# Patient Record
Sex: Female | Born: 1975 | Race: White | Hispanic: No | Smoking: Current every day smoker
Health system: Southern US, Community
[De-identification: ages and names within clinical notes are randomized; demographics above are authoritative.]

## PROBLEM LIST (undated history)

## (undated) DIAGNOSIS — T4145XA Adverse effect of unspecified anesthetic, initial encounter: Secondary | ICD-10-CM

## (undated) DIAGNOSIS — F32A Depression, unspecified: Secondary | ICD-10-CM

## (undated) DIAGNOSIS — J189 Pneumonia, unspecified organism: Secondary | ICD-10-CM

## (undated) DIAGNOSIS — R519 Headache, unspecified: Secondary | ICD-10-CM

## (undated) DIAGNOSIS — E039 Hypothyroidism, unspecified: Secondary | ICD-10-CM

## (undated) DIAGNOSIS — F1010 Alcohol abuse, uncomplicated: Secondary | ICD-10-CM

## (undated) DIAGNOSIS — R51 Headache: Secondary | ICD-10-CM

## (undated) DIAGNOSIS — T8859XA Other complications of anesthesia, initial encounter: Secondary | ICD-10-CM

## (undated) DIAGNOSIS — G43909 Migraine, unspecified, not intractable, without status migrainosus: Secondary | ICD-10-CM

## (undated) DIAGNOSIS — S82842A Displaced bimalleolar fracture of left lower leg, initial encounter for closed fracture: Secondary | ICD-10-CM

## (undated) DIAGNOSIS — D509 Iron deficiency anemia, unspecified: Secondary | ICD-10-CM

## (undated) DIAGNOSIS — K219 Gastro-esophageal reflux disease without esophagitis: Secondary | ICD-10-CM

## (undated) DIAGNOSIS — M5430 Sciatica, unspecified side: Secondary | ICD-10-CM

## (undated) DIAGNOSIS — F329 Major depressive disorder, single episode, unspecified: Secondary | ICD-10-CM

## (undated) DIAGNOSIS — F419 Anxiety disorder, unspecified: Secondary | ICD-10-CM

## (undated) HISTORY — PX: SHOULDER ARTHROSCOPY: SHX128

## (undated) HISTORY — PX: NASAL SEPTOPLASTY W/ TURBINOPLASTY: SHX2070

## (undated) HISTORY — DX: Major depressive disorder, single episode, unspecified: F32.9

## (undated) HISTORY — PX: TONSILLECTOMY: SUR1361

## (undated) HISTORY — DX: Anxiety disorder, unspecified: F41.9

## (undated) HISTORY — PX: APPENDECTOMY: SHX54

## (undated) HISTORY — DX: Depression, unspecified: F32.A

## (undated) HISTORY — PX: FRACTURE SURGERY: SHX138

---

## 1999-09-08 HISTORY — PX: LAPAROSCOPIC TUBAL LIGATION: SUR803

## 1999-12-28 ENCOUNTER — Encounter: Payer: Self-pay | Admitting: Emergency Medicine

## 1999-12-28 ENCOUNTER — Emergency Department (HOSPITAL_COMMUNITY): Admission: EM | Admit: 1999-12-28 | Discharge: 1999-12-28 | Payer: Self-pay | Admitting: Emergency Medicine

## 2000-01-08 ENCOUNTER — Ambulatory Visit (HOSPITAL_COMMUNITY): Admission: RE | Admit: 2000-01-08 | Discharge: 2000-01-08 | Payer: Self-pay | Admitting: Occupational Medicine

## 2000-01-08 ENCOUNTER — Encounter: Payer: Self-pay | Admitting: Occupational Medicine

## 2004-09-07 HISTORY — PX: LAPAROSCOPIC CHOLECYSTECTOMY: SUR755

## 2007-09-08 HISTORY — PX: ROUX-EN-Y GASTRIC BYPASS: SHX1104

## 2008-09-07 HISTORY — PX: DILATION AND CURETTAGE OF UTERUS: SHX78

## 2008-09-07 HISTORY — PX: CYSTOSCOPY WITH HYDRODISTENSION AND BIOPSY: SHX5127

## 2008-09-07 HISTORY — PX: ENDOMETRIAL ABLATION: SHX621

## 2009-09-07 HISTORY — PX: LAPAROTOMY: SHX154

## 2011-09-08 HISTORY — PX: VAGINAL HYSTERECTOMY: SUR661

## 2011-11-20 DIAGNOSIS — E538 Deficiency of other specified B group vitamins: Secondary | ICD-10-CM | POA: Insufficient documentation

## 2011-11-20 DIAGNOSIS — N951 Menopausal and female climacteric states: Secondary | ICD-10-CM | POA: Insufficient documentation

## 2011-11-20 DIAGNOSIS — E039 Hypothyroidism, unspecified: Secondary | ICD-10-CM | POA: Insufficient documentation

## 2012-04-10 ENCOUNTER — Encounter (HOSPITAL_BASED_OUTPATIENT_CLINIC_OR_DEPARTMENT_OTHER): Payer: Self-pay | Admitting: *Deleted

## 2012-04-10 ENCOUNTER — Emergency Department (HOSPITAL_BASED_OUTPATIENT_CLINIC_OR_DEPARTMENT_OTHER)
Admission: EM | Admit: 2012-04-10 | Discharge: 2012-04-10 | Disposition: A | Discharge status: Home / Self Care | Payer: PRIVATE HEALTH INSURANCE | Attending: Emergency Medicine | Admitting: Emergency Medicine

## 2012-04-10 DIAGNOSIS — G5600 Carpal tunnel syndrome, unspecified upper limb: Secondary | ICD-10-CM

## 2012-04-10 DIAGNOSIS — Z90710 Acquired absence of both cervix and uterus: Secondary | ICD-10-CM | POA: Insufficient documentation

## 2012-04-10 DIAGNOSIS — M79609 Pain in unspecified limb: Secondary | ICD-10-CM | POA: Insufficient documentation

## 2012-04-10 DIAGNOSIS — Z9884 Bariatric surgery status: Secondary | ICD-10-CM | POA: Insufficient documentation

## 2012-04-10 DIAGNOSIS — R209 Unspecified disturbances of skin sensation: Secondary | ICD-10-CM | POA: Insufficient documentation

## 2012-04-10 MED ORDER — PREDNISONE 10 MG PO TABS: 20.0000 mg | ORAL_TABLET | Freq: Every day | ORAL | Status: DC

## 2012-04-10 NOTE — ED Notes (Signed)
Yesterday was lifting patient and states she felt a burn in her left shoulder and is now having pain in her arm and numbness to her hand.

## 2012-04-10 NOTE — ED Provider Notes (Signed)
History   This chart was scribed for Julie Quarry, MD by Shari Heritage. The patient was seen in room MH08/MH08. Patient's care was started at 1633.     CSN: [id number]  Arrival date & time 04/10/12  1633   First MD Initiated Contact with Patient 04/10/12 1649      Chief Complaint  Patient presents with  . Arm Injury    (Consider location/radiation/quality/duration/timing/severity/associated sxs/prior treatment) Patient is a 36 y.o. female presenting with arm injury. The history is provided by the patient. No language interpreter was used.  Arm Injury  The incident occurred yesterday. The incident occurred at work. The injury mechanism was a pulled muscle. The injury was related to work. The wounds were self-inflicted. No protective equipment was used. There is an injury to the left shoulder and left upper arm. The pain is moderate. It is unlikely that a foreign body is present. Associated symptoms include numbness. She has received no recent medical care.     Julie Conley is a 36 y.o. female who presents to the Emergency Department complaining of throbbing, moderate to severe left arm pain onset yesterday. Associated symptoms include numbness in her left 4th and 5th fingers. 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 She says that while lifting the patient she felt a twinge in her left shoulder. Patient thinks that this was the result of continued strain on the same muscles for a couple of weeks. Patient has been taking Ibuprofen for pain management. She has a medical history of thyroid disease. Surgical history includes abdominal hysterectomy and gastric bypass. Patient has never smoked.  PCP - [facility]  Past Medical History  Diagnosis Date  . Thyroid disease     Past Surgical History  Procedure Date  . Abdominal hysterectomy   . Gastric bypass      No family history on file.  History  Substance Use Topics  . Smoking status: Never Smoker   . Smokeless tobacco: Not on file  . Alcohol Use: No    OB History    Grav Para Term Preterm Abortions TAB SAB Ect Mult Living                  Review of Systems  Neurological: Positive for numbness.  All other systems reviewed and are negative.    Allergies  Review of patient's allergies indicates not on file.  Home Medications  No current outpatient prescriptions on file.  BP 115/72  Pulse 93  Temp 97.8 F (36.6 C) (Oral)  Resp 20  SpO2 99%  Physical Exam  Nursing note and vitals reviewed. Constitutional: She is oriented to person, place, and time. She appears well-developed and well-nourished.  HENT:  Head: Normocephalic and atraumatic.  Eyes: Conjunctivae and EOM are normal. Pupils are equal, round, and reactive to light.  Neck: Normal range of motion. Neck supple.  Cardiovascular: Normal rate and regular rhythm.   Pulmonary/Chest: Effort normal and breath sounds normal.  Abdominal: Soft. Bowel sounds are normal.  Musculoskeletal:       Upper left extremity was normal. Symptoms were illicited by tapping on anterior aspect of left wrist.  Neurological: She is alert and oriented to person, place, and time.  Skin: Skin is warm and dry.  Psychiatric: She has a normal mood and affect.    ED Course  Procedures (including critical  care time) DIAGNOSTIC STUDIES: Oxygen Saturation is 99% on room air, normal by my interpretation.    COORDINATION OF CARE: 4:51pm- Patient informed of current plan for treatment and evaluation and agrees with plan at this time.     Labs Reviewed - No data to display No results found.   No diagnosis found.    MDM  I personally performed the services described in this documentation, which was scribed in my presence. The recorded information has been reviewed and considered.   Julie Quarry, MD 04/10/12 [phone]

## 2012-12-28 ENCOUNTER — Emergency Department (HOSPITAL_BASED_OUTPATIENT_CLINIC_OR_DEPARTMENT_OTHER)
Admission: EM | Admit: 2012-12-28 | Discharge: 2012-12-28 | Disposition: A | Discharge status: Home / Self Care | Payer: PRIVATE HEALTH INSURANCE | Attending: Emergency Medicine | Admitting: Emergency Medicine

## 2012-12-28 ENCOUNTER — Encounter (HOSPITAL_BASED_OUTPATIENT_CLINIC_OR_DEPARTMENT_OTHER): Payer: Self-pay | Admitting: Family Medicine

## 2012-12-28 DIAGNOSIS — N898 Other specified noninflammatory disorders of vagina: Secondary | ICD-10-CM | POA: Insufficient documentation

## 2012-12-28 DIAGNOSIS — R300 Dysuria: Secondary | ICD-10-CM | POA: Insufficient documentation

## 2012-12-28 DIAGNOSIS — M545 Low back pain, unspecified: Secondary | ICD-10-CM | POA: Insufficient documentation

## 2012-12-28 DIAGNOSIS — E079 Disorder of thyroid, unspecified: Secondary | ICD-10-CM | POA: Insufficient documentation

## 2012-12-28 DIAGNOSIS — Z79899 Other long term (current) drug therapy: Secondary | ICD-10-CM | POA: Insufficient documentation

## 2012-12-28 DIAGNOSIS — R109 Unspecified abdominal pain: Secondary | ICD-10-CM | POA: Insufficient documentation

## 2012-12-28 DIAGNOSIS — N390 Urinary tract infection, site not specified: Secondary | ICD-10-CM

## 2012-12-28 LAB — URINALYSIS, ROUTINE W REFLEX MICROSCOPIC
Nitrite: NEGATIVE
Specific Gravity, Urine: 1.02 (ref 1.005–1.030)
pH: 7 (ref 5.0–8.0)

## 2012-12-28 LAB — WET PREP, GENITAL
Clue Cells Wet Prep HPF POC: NONE SEEN
Trich, Wet Prep: NONE SEEN
Yeast Wet Prep HPF POC: NONE SEEN

## 2012-12-28 LAB — URINE MICROSCOPIC-ADD ON

## 2012-12-28 MED ORDER — CEPHALEXIN 500 MG PO CAPS: 500.0000 mg | ORAL_CAPSULE | Freq: Four times a day (QID) | ORAL | Status: DC

## 2012-12-28 NOTE — ED Provider Notes (Signed)
History     CSN: [id number]  Arrival date & time 12/28/12  1478   First MD Initiated Contact with Patient 12/28/12 0745      Chief Complaint  Patient presents with  . Urinary Tract Infection    (Consider location/radiation/quality/duration/timing/severity/associated sxs/prior treatment) Patient is a 37 y.o. female presenting with urinary tract infection.  Urinary Tract Infection   Pt reports history of urinary tract infections, states she was treated for same at local Urgent Care several weeks ago with course of Macrobid, given a refill which she also took when symptoms returned and again about a week ago while out of town, she was given a course of Levaquin which she finished yesterday. She reports continued dysuria, vaginal itching and white discharge. She has moderate aching bilateral lower back pain, but no fever or vomiting. She was seen at [facility] about a month ago for vaginal discharge and treated for BV. GC/C was neg then.   Past Medical History  Diagnosis Date  . Thyroid disease     Past Surgical History  Procedure Laterality Date  . Abdominal hysterectomy    . Gastric bypass    . Tonsillectomy    . Cholecystectomy    . Appendectomy    . Cesarean section      No family history on file.  History  Substance Use Topics  . Smoking status: Never Smoker   . Smokeless tobacco: Not on file  . Alcohol Use: No    OB History   Grav Para Term Preterm Abortions TAB SAB Ect Mult Living                  Review of Systems All other systems reviewed and are negative except as noted in HPI.   Allergies  Ciprocinonide and Sulfa antibiotics  Home Medications   Current Outpatient Rx  Name  Route  Sig  Dispense  Refill  . estrogens, conjugated, (PREMARIN) 0.625 MG tablet   Oral   Take 0.625 mg by mouth daily. Take daily for 21 days then do not take for 7 days.         Marland Kitchen levothyroxine (SYNTHROID, LEVOTHROID) 25 MCG tablet   Oral   Take 25 mcg by mouth  daily.         . Multiple Vitamin (MULTIVITAMIN) tablet   Oral   Take 1 tablet by mouth daily.         Marland Kitchen omeprazole (PRILOSEC) 20 MG capsule   Oral   Take 20 mg by mouth daily.         . predniSONE (DELTASONE) 10 MG tablet   Oral   Take 2 tablets (20 mg total) by mouth daily.   15 tablet   0     BP 124/91  Pulse 92  Temp(Src) 98.2 F (36.8 C) (Oral)  Resp 16  SpO2 99%  Physical Exam  Nursing note and vitals reviewed. Constitutional: She is oriented to person, place, and time. She appears well-developed and well-nourished.  HENT:  Head: Normocephalic and atraumatic.  Eyes: EOM are normal. Pupils are equal, round, and reactive to light.  Neck: Normal range of motion. Neck supple.  Cardiovascular: Normal rate, normal heart sounds and intact distal pulses.   Pulmonary/Chest: Effort normal and breath sounds normal.  Abdominal: Bowel sounds are normal. She exhibits no distension. There is tenderness (suprapubic, mild). There is no rebound and no guarding.  Musculoskeletal: Normal range of motion. She exhibits tenderness (lumbar paraspinal muscles). She exhibits no edema.  Neurological: She is alert and oriented to person, place, and time. She has normal strength. No cranial nerve deficit or sensory deficit.  Skin: Skin is warm and dry. No rash noted.  Psychiatric: She has a normal mood and affect.    ED Course  Procedures (including critical care time)  Labs Reviewed  WET PREP, GENITAL - Abnormal; Notable for the following:    WBC, Wet Prep HPF POC MANY (*)    All other components within normal limits  URINALYSIS, ROUTINE W REFLEX MICROSCOPIC - Abnormal; Notable for the following:    APPearance CLOUDY (*)    Leukocytes, UA LARGE (*)    All other components within normal limits  URINE MICROSCOPIC-ADD ON - Abnormal; Notable for the following:    Bacteria, UA MANY (*)    All other components within normal limits  GC/CHLAMYDIA PROBE AMP  URINE CULTURE   No  results found.   1. UTI (urinary tract infection)       MDM  UA shows UTI, neg wet prep. Will give course of Keflex, culture urine given recent Abx and concern for resistant strain. PCP followup.         Charles B. Bernette Mayers, MD 12/28/12 [phone]

## 2012-12-28 NOTE — ED Notes (Signed)
Pt c/o dysuria, right flank pain and has been treated for approx 1 month for same at [facility]. Pt denies fever, n/v. Pt also c/o possible "yeast infection".

## 2012-12-29 LAB — URINE CULTURE: Colony Count: NO GROWTH

## 2013-02-26 ENCOUNTER — Emergency Department (HOSPITAL_BASED_OUTPATIENT_CLINIC_OR_DEPARTMENT_OTHER): Payer: Self-pay

## 2013-02-26 ENCOUNTER — Other Ambulatory Visit: Payer: Self-pay

## 2013-02-26 ENCOUNTER — Encounter (HOSPITAL_BASED_OUTPATIENT_CLINIC_OR_DEPARTMENT_OTHER): Payer: Self-pay | Admitting: *Deleted

## 2013-02-26 ENCOUNTER — Emergency Department (HOSPITAL_BASED_OUTPATIENT_CLINIC_OR_DEPARTMENT_OTHER)
Admission: EM | Admit: 2013-02-26 | Discharge: 2013-02-26 | Disposition: A | Discharge status: Other Acute Inpatient Hospital | Payer: Self-pay | Attending: Emergency Medicine | Admitting: Emergency Medicine

## 2013-02-26 DIAGNOSIS — Z3202 Encounter for pregnancy test, result negative: Secondary | ICD-10-CM | POA: Insufficient documentation

## 2013-02-26 DIAGNOSIS — F10239 Alcohol dependence with withdrawal, unspecified: Secondary | ICD-10-CM | POA: Insufficient documentation

## 2013-02-26 DIAGNOSIS — Z9884 Bariatric surgery status: Secondary | ICD-10-CM | POA: Insufficient documentation

## 2013-02-26 DIAGNOSIS — N760 Acute vaginitis: Secondary | ICD-10-CM | POA: Insufficient documentation

## 2013-02-26 DIAGNOSIS — F10939 Alcohol use, unspecified with withdrawal, unspecified: Secondary | ICD-10-CM | POA: Insufficient documentation

## 2013-02-26 DIAGNOSIS — F10230 Alcohol dependence with withdrawal, uncomplicated: Secondary | ICD-10-CM

## 2013-02-26 DIAGNOSIS — F329 Major depressive disorder, single episode, unspecified: Secondary | ICD-10-CM | POA: Insufficient documentation

## 2013-02-26 DIAGNOSIS — B9689 Other specified bacterial agents as the cause of diseases classified elsewhere: Secondary | ICD-10-CM | POA: Insufficient documentation

## 2013-02-26 DIAGNOSIS — R0789 Other chest pain: Secondary | ICD-10-CM | POA: Insufficient documentation

## 2013-02-26 DIAGNOSIS — N898 Other specified noninflammatory disorders of vagina: Secondary | ICD-10-CM | POA: Insufficient documentation

## 2013-02-26 DIAGNOSIS — A499 Bacterial infection, unspecified: Secondary | ICD-10-CM | POA: Insufficient documentation

## 2013-02-26 DIAGNOSIS — N390 Urinary tract infection, site not specified: Secondary | ICD-10-CM | POA: Insufficient documentation

## 2013-02-26 DIAGNOSIS — F3289 Other specified depressive episodes: Secondary | ICD-10-CM | POA: Insufficient documentation

## 2013-02-26 LAB — COMPREHENSIVE METABOLIC PANEL
BUN: 11 mg/dL (ref 6–23)
Calcium: 9.7 mg/dL (ref 8.4–10.5)
GFR calc Af Amer: >90 mL/min (ref >90)
Glucose, Bld: 110 mg/dL — ABNORMAL HIGH (ref 70–99)
Total Protein: 8.2 g/dL (ref 6.0–8.3)

## 2013-02-26 LAB — CBC WITH DIFFERENTIAL/PLATELET
Eosinophils Absolute: 0 10*3/uL (ref 0.0–0.7)
Eosinophils Relative: 1 % (ref 0–5)
Hemoglobin: 15.4 g/dL — ABNORMAL HIGH (ref 12.0–15.0)
Lymphs Abs: 1.9 10*3/uL (ref 0.7–4.0)
MCH: 32.6 pg (ref 26.0–34.0)
MCV: 91.3 fL (ref 78.0–100.0)
Monocytes Absolute: 0.3 10*3/uL (ref 0.1–1.0)
Monocytes Relative: 5 % (ref 3–12)
RBC: 4.72 MIL/uL (ref 3.87–5.11)

## 2013-02-26 LAB — URINALYSIS, ROUTINE W REFLEX MICROSCOPIC
Ketones, ur: 40 mg/dL — AB
Nitrite: NEGATIVE
Specific Gravity, Urine: 1.03 (ref 1.005–1.030)
pH: 6.5 (ref 5.0–8.0)

## 2013-02-26 LAB — ACETAMINOPHEN LEVEL: Acetaminophen (Tylenol), Serum: <15 ug/mL (ref 10–30)

## 2013-02-26 LAB — URINE MICROSCOPIC-ADD ON

## 2013-02-26 LAB — RAPID URINE DRUG SCREEN, HOSP PERFORMED
Barbiturates: NOT DETECTED
Cocaine: NOT DETECTED

## 2013-02-26 LAB — SALICYLATE LEVEL: Salicylate Lvl: <2 mg/dL — ABNORMAL LOW (ref 2.8–20.0)

## 2013-02-26 LAB — WET PREP, GENITAL: Yeast Wet Prep HPF POC: NONE SEEN

## 2013-02-26 MED ORDER — ADULT MULTIVITAMIN W/MINERALS CH
1.0000 | ORAL_TABLET | Freq: Every day | ORAL | Status: DC
  Administered 2013-02-26: 1 via ORAL
  Filled 2013-02-26: qty 1

## 2013-02-26 MED ORDER — ONDANSETRON 8 MG PO TBDP
8.0000 mg | ORAL_TABLET | Freq: Once | ORAL | Status: AC
  Administered 2013-02-26: 8 mg via ORAL

## 2013-02-26 MED ORDER — METRONIDAZOLE 500 MG PO TABS
500.0000 mg | ORAL_TABLET | Freq: Once | ORAL | Status: AC
  Administered 2013-02-26: 500 mg via ORAL
  Filled 2013-02-26: qty 1

## 2013-02-26 MED ORDER — LORAZEPAM 1 MG PO TABS: 1.0000 mg | ORAL_TABLET | Freq: Four times a day (QID) | ORAL | Status: DC | PRN

## 2013-02-26 MED ORDER — THIAMINE HCL 100 MG/ML IJ SOLN: 100.0000 mg | Freq: Every day | INTRAMUSCULAR | Status: DC

## 2013-02-26 MED ORDER — VITAMIN B-1 100 MG PO TABS
100.0000 mg | ORAL_TABLET | Freq: Every day | ORAL | Status: DC
  Administered 2013-02-26: 100 mg via ORAL
  Filled 2013-02-26: qty 1

## 2013-02-26 MED ORDER — ONDANSETRON 8 MG PO TBDP
ORAL_TABLET | ORAL | Status: AC
  Filled 2013-02-26: qty 1

## 2013-02-26 MED ORDER — LORAZEPAM 2 MG/ML IJ SOLN
1.0000 mg | Freq: Four times a day (QID) | INTRAMUSCULAR | Status: DC | PRN
  Administered 2013-02-26: 1 mg via INTRAVENOUS
  Filled 2013-02-26 (×2): qty 1

## 2013-02-26 MED ORDER — CEPHALEXIN 250 MG PO CAPS
500.0000 mg | ORAL_CAPSULE | Freq: Once | ORAL | Status: AC
  Administered 2013-02-26: 500 mg via ORAL
  Filled 2013-02-26: qty 2

## 2013-02-26 MED ORDER — FOLIC ACID 1 MG PO TABS
1.0000 mg | ORAL_TABLET | Freq: Every day | ORAL | Status: DC
  Administered 2013-02-26: 1 mg via ORAL
  Filled 2013-02-26: qty 1

## 2013-02-26 MED ORDER — LORAZEPAM 2 MG/ML IJ SOLN
1.0000 mg | Freq: Once | INTRAMUSCULAR | Status: AC
  Administered 2013-02-26: 1 mg via INTRAVENOUS

## 2013-02-26 NOTE — ED Notes (Signed)
Has been drinking for past 5 days reports drinking 2-3 six packs each day for those days pt lost her husband recently and has been depressed has had some pain in chest off and on each day for 5 days but worse today

## 2013-02-26 NOTE — ED Provider Notes (Addendum)
History    Scribed for Julie Cooper III, MD, the patient was seen in room MH10/MH10. This chart was scribed by Lewanda Rife, ED scribe. Patient's care was started at 1850   CSN: [id number]  Arrival date & time 02/26/13  4098   First MD Initiated Contact with Patient 02/26/13 1847      Chief Complaint  Patient presents with  . Chest Pain    concerned may have etoh poisioning    (Consider location/radiation/quality/duration/timing/severity/associated sxs/prior treatment) The history is provided by the patient.   HPI Comments: Julie Conley is a 37 y.o. female who presents to the Emergency Department complaining of "possible alcohol poisoning" onset today. Reports associated "binge drinking for past 5 days", chest pain, vaginal discharge, and depression. Describes chest pain as "tightness". Reports several episodes of drinking beer intermittently since her husband's death 09-21-22 of this year. Reports drinking "several 6 packs a day" recently. Denies any aggravating and alleviating factors. Denies associated otalgia, sore throat, shortness of breath, cough, fever, dysuria, rash, syncope, seizures, abdominal pain, nausea, emesis, diarrhea, and suicidal ideations. Denies significant PMH. Denies illicit substance abuse. Reports hx of abdominal hysterectomy, appendectomy,   History reviewed. No pertinent past medical history.  Past Surgical History  Procedure Laterality Date  . Abdominal hysterectomy    . Appendectomy    . Tonsillectomy    . Gastric bypass      History reviewed. No pertinent family history.  History  Substance Use Topics  . Smoking status: Never Smoker   . Smokeless tobacco: Not on file  . Alcohol Use: Yes     Comment: usually drinks occasionally but has drank daily for last 5 days    OB History   Grav Para Term Preterm Abortions TAB SAB Ect Mult Living                  Review of Systems  Constitutional: Negative for fever.  HENT: Negative for sore  throat.   Respiratory: Negative for shortness of breath.   Cardiovascular: Positive for chest pain.  Gastrointestinal: Negative for nausea, vomiting, abdominal pain and diarrhea.  Genitourinary: Positive for vaginal discharge. Negative for dysuria and vaginal pain.  Skin: Negative for rash.  Neurological: Negative for seizures and syncope.  Psychiatric/Behavioral: Negative for suicidal ideas and confusion.  All other systems reviewed and are negative.  A complete 10 system review of systems was obtained and all systems are negative except as noted in the HPI and PMH.     Allergies  Sulfa antibiotics  Home Medications  No current outpatient prescriptions on file.  There were no vitals taken for this visit.  Physical Exam  Nursing note and vitals reviewed. Constitutional: She is oriented to person, place, and time. She appears well-developed and well-nourished. No distress.  HENT:  Head: Normocephalic and atraumatic.  Right Ear: Tympanic membrane normal.  Left Ear: Tympanic membrane normal.  Mouth/Throat: Uvula is midline and oropharynx is clear and moist. No posterior oropharyngeal erythema.  Eyes: Conjunctivae and EOM are normal.  Neck: Trachea normal. Neck supple. Carotid bruit is not present. No tracheal deviation present.  Cardiovascular: Normal rate, regular rhythm, normal heart sounds, intact distal pulses and normal pulses.   Pulmonary/Chest: Effort normal and breath sounds normal. No respiratory distress. She has no wheezes. She has no rales. She exhibits no tenderness.  Abdominal: Soft. Bowel sounds are normal. She exhibits no distension. There is no tenderness.  Genitourinary: Uterus normal. Uterus is not tender. Right adnexum displays no mass and  no tenderness. Left adnexum displays no mass and no tenderness. No tenderness around the vagina. Vaginal discharge found.  Normal female external genitalia. Speculum exam shows a white d/c. Bimanual exam shows no uterine or  adnexal tenderness, or mass.    Musculoskeletal: Normal range of motion.  Neurological: She is alert and oriented to person, place, and time.  noticeable hand tremor noted on exam.    Skin: Skin is warm and dry.  Psychiatric: Her behavior is normal. She exhibits a depressed mood. She expresses no suicidal ideation.    ED Course  Procedures (including critical care time) Medications  LORazepam (ATIVAN) tablet 1 mg ( Oral See Alternative 02/26/13 1940)    Or  LORazepam (ATIVAN) injection 1 mg (1 mg Intravenous Given 02/26/13 1940)  thiamine (VITAMIN B-1) tablet 100 mg (not administered)    Or  thiamine (B-1) injection 100 mg (not administered)  folic acid (FOLVITE) tablet 1 mg (not administered)  multivitamin with minerals tablet 1 tablet (not administered)  ondansetron (ZOFRAN-ODT) disintegrating tablet 8 mg (8 mg Oral Given 02/26/13 1903)     Date: 02/26/2013  Rate: 93  Rhythm: normal sinus rhythm  QRS Axis: left  Intervals: normal  ST/T Wave abnormalities: normal  Conduction Disutrbances:none  Narrative Interpretation: Abnormal EKG  Old EKG Reviewed: none available  10:17 PM Results for orders placed during the hospital encounter of 02/26/13  WET PREP, GENITAL      Result Value Range   Yeast Wet Prep HPF POC NONE SEEN  NONE SEEN   Trich, Wet Prep NONE SEEN  NONE SEEN   Clue Cells Wet Prep HPF POC TOO NUMEROUS TO COUNT (*) NONE SEEN   WBC, Wet Prep HPF POC FEW (*) NONE SEEN  CBC WITH DIFFERENTIAL      Result Value Range   WBC 6.6  4.0 - 10.5 K/uL   RBC 4.72  3.87 - 5.11 MIL/uL   Hemoglobin 15.4 (*) 12.0 - 15.0 g/dL   HCT 96.0  45.4 - 09.8 %   MCV 91.3  78.0 - 100.0 fL   MCH 32.6  26.0 - 34.0 pg   MCHC 35.7  30.0 - 36.0 g/dL   RDW 11.9  14.7 - 82.9 %   Platelets 232  150 - 400 K/uL   Neutrophils Relative % 65  43 - 77 %   Neutro Abs 4.3  1.7 - 7.7 K/uL   Lymphocytes Relative 29  12 - 46 %   Lymphs Abs 1.9  0.7 - 4.0 K/uL   Monocytes Relative 5  3 - 12 %    Monocytes Absolute 0.3  0.1 - 1.0 K/uL   Eosinophils Relative 1  0 - 5 %   Eosinophils Absolute 0.0  0.0 - 0.7 K/uL   Basophils Relative 1  0 - 1 %   Basophils Absolute 0.0  0.0 - 0.1 K/uL  COMPREHENSIVE METABOLIC PANEL      Result Value Range   Sodium 138  135 - 145 mEq/L   Potassium 3.9  3.5 - 5.1 mEq/L   Chloride 95 (*) 96 - 112 mEq/L   CO2 26  19 - 32 mEq/L   Glucose, Bld 110 (*) 70 - 99 mg/dL   BUN 11  6 - 23 mg/dL   Creatinine, Ser 5.62  0.50 - 1.10 mg/dL   Calcium 9.7  8.4 - 13.0 mg/dL   Total Protein 8.2  6.0 - 8.3 g/dL   Albumin 4.6  3.5 - 5.2 g/dL   AST  35  0 - 37 U/L   ALT 21  0 - 35 U/L   Alkaline Phosphatase 77  39 - 117 U/L   Total Bilirubin 0.4  0.3 - 1.2 mg/dL   GFR calc non Af Amer >90  >90 mL/min   GFR calc Af Amer >90  >90 mL/min  URINALYSIS, ROUTINE W REFLEX MICROSCOPIC      Result Value Range   Color, Urine AMBER (*) YELLOW   APPearance CLOUDY (*) CLEAR   Specific Gravity, Urine 1.030  1.005 - 1.030   pH 6.5  5.0 - 8.0   Glucose, UA NEGATIVE  NEGATIVE mg/dL   Hgb urine dipstick NEGATIVE  NEGATIVE   Bilirubin Urine SMALL (*) NEGATIVE   Ketones, ur 40 (*) NEGATIVE mg/dL   Protein, ur 161 (*) NEGATIVE mg/dL   Urobilinogen, UA 1.0  0.0 - 1.0 mg/dL   Nitrite NEGATIVE  NEGATIVE   Leukocytes, UA MODERATE (*) NEGATIVE  PREGNANCY, URINE      Result Value Range   Preg Test, Ur NEGATIVE  NEGATIVE  URINE RAPID DRUG SCREEN (HOSP PERFORMED)      Result Value Range   Opiates NONE DETECTED  NONE DETECTED   Cocaine NONE DETECTED  NONE DETECTED   Benzodiazepines NONE DETECTED  NONE DETECTED   Amphetamines NONE DETECTED  NONE DETECTED   Tetrahydrocannabinol NONE DETECTED  NONE DETECTED   Barbiturates NONE DETECTED  NONE DETECTED  ETHANOL      Result Value Range   Alcohol, Ethyl (B) <11  0 - 11 mg/dL  ACETAMINOPHEN LEVEL      Result Value Range   Acetaminophen (Tylenol), Serum <15.0  10 - 30 ug/mL  SALICYLATE LEVEL      Result Value Range   Salicylate Lvl <2.0  (*) 2.8 - 20.0 mg/dL  URINE MICROSCOPIC-ADD ON      Result Value Range   Squamous Epithelial / LPF FEW (*) RARE   WBC, UA TOO NUMEROUS TO COUNT  <3 WBC/hpf   RBC / HPF 11-20  <3 RBC/hpf   Bacteria, UA MANY (*) RARE   Dg Chest 2 View  02/26/2013   *RADIOLOGY REPORT*  Clinical Data: Binge  drinking.  Chest pain depression  CHEST - 2 VIEW  Comparison:  None.  Findings:  The heart size and mediastinal contours are within normal limits.  Both lungs are clear.  The visualized skeletal structures are unremarkable.  IMPRESSION: No active cardiopulmonary disease.   Original Report Authenticated By: Janeece Riggers, M.D.    Pt has had 2 doses of Ativan, is not currently tremulous.  Her lab tests showed a UTI and bacterial vaginosis.  She will need treatment with metronidazole and an antibiotic such as Keflex 500 mg qid. Will request hospitalist at [facility] to admit her for alcohol withdrawal, impending DT's.      1. Alcohol withdrawal syndrome, uncomplicated   2. Urinary tract infection   3. Bacterial vaginosis     Case discussed with Dr. Pearson Grippe at [facility], who accepts pt to a telemetry bed.   I personally performed the services described in this documentation, which was scribed in my presence. The recorded information has been reviewed and is accurate.  Osvaldo Human, MD      Julie Cooper III, MD 02/26/13 2241     Julie Cooper III, MD 02/26/13 2242

## 2013-02-26 NOTE — ED Notes (Signed)
[facility] here to transport pt. 

## 2013-02-26 NOTE — ED Notes (Signed)
Assigned to bed 730 @ [facility] per nursing supervisor, Boneta Lucks, RN notified, [facility] called for transport.

## 2013-02-28 LAB — URINE CULTURE

## 2013-03-01 ENCOUNTER — Encounter (HOSPITAL_BASED_OUTPATIENT_CLINIC_OR_DEPARTMENT_OTHER): Payer: Self-pay | Admitting: *Deleted

## 2013-03-01 NOTE — ED Notes (Deleted)
Fax results to [facility]  Per Glade Nurse.

## 2013-03-03 NOTE — ED Notes (Deleted)
+   Chlamydia Patient treated with Rocephin And Zithromax-[facility] faxed 

## 2013-03-05 ENCOUNTER — Telehealth (HOSPITAL_COMMUNITY): Payer: Self-pay | Admitting: Emergency Medicine

## 2013-03-05 NOTE — Telephone Encounter (Signed)
Pt notified of + Chlamydia. Treatment not given in ED. Notified of + result after ID verified x three. STD instructions provided. RX Azithromycin one gram PO x one dose, prescriber Marlon Pel PA called to [facility] [phone] staff.

## 2013-03-06 NOTE — ED Notes (Signed)
+   Gonorrhea + Chlamydia Rx for Azithromycin 1 gram tab take tab po once disp #2 per Marlon Pel. Needs to be called to pharmacy.Rx called to [facility] per Steward Ros PFM

## 2013-09-07 HISTORY — PX: AUGMENTATION MAMMAPLASTY: SUR837

## 2013-09-07 HISTORY — PX: ABDOMINOPLASTY: SHX5355

## 2013-10-07 DIAGNOSIS — F419 Anxiety disorder, unspecified: Secondary | ICD-10-CM

## 2013-10-07 DIAGNOSIS — F329 Major depressive disorder, single episode, unspecified: Secondary | ICD-10-CM | POA: Insufficient documentation

## 2013-10-07 DIAGNOSIS — F32A Depression, unspecified: Secondary | ICD-10-CM | POA: Insufficient documentation

## 2013-10-07 DIAGNOSIS — G4700 Insomnia, unspecified: Secondary | ICD-10-CM | POA: Insufficient documentation

## 2014-08-06 ENCOUNTER — Emergency Department (HOSPITAL_BASED_OUTPATIENT_CLINIC_OR_DEPARTMENT_OTHER)
Admission: EM | Admit: 2014-08-06 | Discharge: 2014-08-06 | Disposition: A | Discharge status: Home / Self Care | Payer: 59 | Attending: Emergency Medicine | Admitting: Emergency Medicine

## 2014-08-06 ENCOUNTER — Encounter (HOSPITAL_BASED_OUTPATIENT_CLINIC_OR_DEPARTMENT_OTHER): Payer: Self-pay | Admitting: *Deleted

## 2014-08-06 DIAGNOSIS — Z8639 Personal history of other endocrine, nutritional and metabolic disease: Secondary | ICD-10-CM | POA: Diagnosis not present

## 2014-08-06 DIAGNOSIS — F1010 Alcohol abuse, uncomplicated: Secondary | ICD-10-CM | POA: Insufficient documentation

## 2014-08-06 LAB — CBC WITH DIFFERENTIAL/PLATELET
BASOS PCT: 1 % (ref 0–1)
Basophils Absolute: 0 10*3/uL (ref 0.0–0.1)
Eosinophils Absolute: 0.1 10*3/uL (ref 0.0–0.7)
Eosinophils Relative: 1 % (ref 0–5)
HCT: 38.9 % (ref 36.0–46.0)
HEMOGLOBIN: 13.7 g/dL (ref 12.0–15.0)
LYMPHS ABS: 2.8 10*3/uL (ref 0.7–4.0)
Lymphocytes Relative: 43 % (ref 12–46)
MCH: 30.2 pg (ref 26.0–34.0)
MCHC: 35.2 g/dL (ref 30.0–36.0)
MCV: 85.9 fL (ref 78.0–100.0)
MONOS PCT: 6 % (ref 3–12)
Monocytes Absolute: 0.4 10*3/uL (ref 0.1–1.0)
NEUTROS PCT: 49 % (ref 43–77)
Neutro Abs: 3.2 10*3/uL (ref 1.7–7.7)
Platelets: 221 10*3/uL (ref 150–400)
RBC: 4.53 MIL/uL (ref 3.87–5.11)
RDW: 12.7 % (ref 11.5–15.5)
WBC: 6.5 10*3/uL (ref 4.0–10.5)

## 2014-08-06 LAB — COMPREHENSIVE METABOLIC PANEL
ALBUMIN: 4.3 g/dL (ref 3.5–5.2)
ALT: 17 U/L (ref 0–35)
ANION GAP: 17 — AB (ref 5–15)
AST: 27 U/L (ref 0–37)
Alkaline Phosphatase: 91 U/L (ref 39–117)
BILIRUBIN TOTAL: 0.2 mg/dL — AB (ref 0.3–1.2)
BUN: 9 mg/dL (ref 6–23)
CHLORIDE: 95 meq/L — AB (ref 96–112)
CO2: 26 mEq/L (ref 19–32)
Calcium: 9.5 mg/dL (ref 8.4–10.5)
Creatinine, Ser: 0.7 mg/dL (ref 0.50–1.10)
GFR calc Af Amer: >90 mL/min (ref >90)
GFR calc non Af Amer: >90 mL/min (ref >90)
Glucose, Bld: 78 mg/dL (ref 70–99)
POTASSIUM: 4.6 meq/L (ref 3.7–5.3)
Sodium: 138 mEq/L (ref 137–147)
Total Protein: 8.2 g/dL (ref 6.0–8.3)

## 2014-08-06 LAB — RAPID URINE DRUG SCREEN, HOSP PERFORMED
Amphetamines: NOT DETECTED
Barbiturates: NOT DETECTED
Benzodiazepines: NOT DETECTED
Cocaine: NOT DETECTED
Opiates: NOT DETECTED
TETRAHYDROCANNABINOL: NOT DETECTED

## 2014-08-06 LAB — ETHANOL: Alcohol, Ethyl (B): 228 mg/dL — ABNORMAL HIGH (ref 0–11)

## 2014-08-06 MED ORDER — ONDANSETRON HCL 4 MG/2ML IJ SOLN
INTRAMUSCULAR | Status: AC
  Filled 2014-08-06: qty 2

## 2014-08-06 MED ORDER — ONDANSETRON 4 MG PO TBDP: ORAL_TABLET | ORAL | Status: DC

## 2014-08-06 MED ORDER — ONDANSETRON HCL 4 MG/2ML IJ SOLN
4.0000 mg | Freq: Once | INTRAMUSCULAR | Status: DC
  Filled 2014-08-06: qty 2

## 2014-08-06 MED ORDER — ONDANSETRON HCL 4 MG/2ML IJ SOLN
4.0000 mg | Freq: Once | INTRAMUSCULAR | Status: AC
  Administered 2014-08-06: 4 mg via INTRAVENOUS

## 2014-08-06 MED ORDER — LORAZEPAM 2 MG/ML IJ SOLN
1.0000 mg | Freq: Once | INTRAMUSCULAR | Status: AC
  Administered 2014-08-06: 1 mg via INTRAVENOUS
  Filled 2014-08-06: qty 1

## 2014-08-06 MED ORDER — SODIUM CHLORIDE 0.9 % IV BOLUS (SEPSIS)
1000.0000 mL | Freq: Once | INTRAVENOUS | Status: AC
  Administered 2014-08-06: 1000 mL via INTRAVENOUS

## 2014-08-06 NOTE — ED Provider Notes (Signed)
CSN: [id number]     Arrival date & time 08/06/14  1525 History   First MD Initiated Contact with Patient 08/06/14 1625     Chief Complaint  Patient presents with  . Alcohol Problem     (Consider location/radiation/quality/duration/timing/severity/associated sxs/prior Treatment) Patient is a 38 y.o. female presenting with alcohol problem. The history is provided by the patient. No language interpreter was used.  Alcohol Problem This is a new problem. The current episode started in the past 7 days. The problem occurs constantly. The problem has been gradually worsening. Pertinent negatives include no abdominal pain or vomiting. She has tried nothing for the symptoms. The treatment provided moderate relief.  Pt request help with alcohol abuse.  Pt reports she has been drinking since Friday.  Pt reports she was unable to stop  Past Medical History  Diagnosis Date  . Thyroid disease    Past Surgical History  Procedure Laterality Date  . Cholecystectomy    . Cesarean section    . Abdominal hysterectomy    . Appendectomy    . Tonsillectomy    . Gastric bypass     History reviewed. No pertinent family history. History  Substance Use Topics  . Smoking status: Never Smoker   . Smokeless tobacco: Not on file  . Alcohol Use: Yes     Comment: usually drinks occasionally but has drank daily for last 5 days   OB History    No data available     Review of Systems  Gastrointestinal: Negative for vomiting and abdominal pain.  All other systems reviewed and are negative.     Allergies  Ciprocinonide; Sulfa antibiotics; and Sulfa antibiotics  Home Medications   Prior to Admission medications   Not on File   BP 140/99 mmHg  Pulse 82  Temp(Src) 98.4 F (36.9 C) (Oral)  Resp 18  Ht 5\' 4"  (1.626 m)  Wt 175 lb (79.379 kg)  BMI 30.02 kg/m2  SpO2 97% Physical Exam  Constitutional: She is oriented to person, place, and time. She appears well-developed and well-nourished.  HENT:   Head: Normocephalic.  Eyes: Conjunctivae and EOM are normal. Pupils are equal, round, and reactive to light.  Neck: Normal range of motion.  Cardiovascular: Normal rate.   Pulmonary/Chest: Effort normal.  Abdominal: She exhibits no distension.  Musculoskeletal: Normal range of motion.  Neurological: She is alert and oriented to person, place, and time.  Skin: Skin is warm.  Psychiatric: She has a normal mood and affect.  Nursing note and vitals reviewed.   ED Course  Procedures (including critical care time) Labs Review Labs Reviewed - No data to display  Imaging Review No results found.   EKG Interpretation None      Results for orders placed or performed during the hospital encounter of 08/06/14  Ethanol  Result Value Ref Range   Alcohol, Ethyl (B) 228 (H) 0 - 11 mg/dL  Drug screen panel, emergency  Result Value Ref Range   Opiates NONE DETECTED NONE DETECTED   Cocaine NONE DETECTED NONE DETECTED   Benzodiazepines NONE DETECTED NONE DETECTED   Amphetamines NONE DETECTED NONE DETECTED   Tetrahydrocannabinol NONE DETECTED NONE DETECTED   Barbiturates NONE DETECTED NONE DETECTED  CBC with Differential  Result Value Ref Range   WBC 6.5 4.0 - 10.5 K/uL   RBC 4.53 3.87 - 5.11 MIL/uL   Hemoglobin 13.7 12.0 - 15.0 g/dL   HCT 40.938.9 81.136.0 - 91.446.0 %   MCV 85.9 78.0 - 100.0 fL  MCH 30.2 26.0 - 34.0 pg   MCHC 35.2 30.0 - 36.0 g/dL   RDW 24.412.7 01.011.5 - 27.215.5 %   Platelets 221 150 - 400 K/uL   Neutrophils Relative % 49 43 - 77 %   Neutro Abs 3.2 1.7 - 7.7 K/uL   Lymphocytes Relative 43 12 - 46 %   Lymphs Abs 2.8 0.7 - 4.0 K/uL   Monocytes Relative 6 3 - 12 %   Monocytes Absolute 0.4 0.1 - 1.0 K/uL   Eosinophils Relative 1 0 - 5 %   Eosinophils Absolute 0.1 0.0 - 0.7 K/uL   Basophils Relative 1 0 - 1 %   Basophils Absolute 0.0 0.0 - 0.1 K/uL  Comprehensive metabolic panel  Result Value Ref Range   Sodium 138 137 - 147 mEq/L   Potassium 4.6 3.7 - 5.3 mEq/L   Chloride 95 (L)  96 - 112 mEq/L   CO2 26 19 - 32 mEq/L   Glucose, Bld 78 70 - 99 mg/dL   BUN 9 6 - 23 mg/dL   Creatinine, Ser 5.360.70 0.50 - 1.10 mg/dL   Calcium 9.5 8.4 - 64.410.5 mg/dL   Total Protein 8.2 6.0 - 8.3 g/dL   Albumin 4.3 3.5 - 5.2 g/dL   AST 27 0 - 37 U/L   ALT 17 0 - 35 U/L   Alkaline Phosphatase 91 39 - 117 U/L   Total Bilirubin 0.2 (L) 0.3 - 1.2 mg/dL   GFR calc non Af Amer >90 >90 mL/min   GFR calc Af Amer >90 >90 mL/min   Anion gap 17 (H) 5 - 15   No results found.   MDM  Pt given Iv fluids, ativan Iv and zofran.   Pt feels better on reevaluation.   TTs offered in patient treatment.  Pt declined.   Pt seen by Dr. Littie DeedsGentry.    Final diagnoses:  Alcohol abuse       Elson AreasLeslie K Amilya Haver, PA-C 08/06/14 2254  Mirian MoMatthew Gentry, MD 08/12/14 [phone]

## 2014-08-06 NOTE — ED Notes (Addendum)
Pt alert, NAD, calm, interactive, cooperative, polite, (denies sx),  nausea resolved, given soda and crackers, [facility]

## 2014-08-06 NOTE — BH Assessment (Signed)
Dr. Gilmore LarocheAkhtar patient meets criteria for inpatient hospitalization.  [facility]  Patient will be referred to other facilities.

## 2014-08-06 NOTE — ED Notes (Signed)
Pt and pt's mother pre-occupied with care & plan for husband (in another room, as a pt, for similar).  Pt ambulatory to b/r with steady gait. Reports: "feel better", HA 5/10, nausea 10/10 (no emesis or vomiting), "feel tachycardic and dehydrated".

## 2014-08-06 NOTE — Discharge Instructions (Signed)
Alcohol Intoxication Alcohol intoxication occurs when you drink enough alcohol that it affects your ability to function. It can be mild or very severe. Drinking a lot of alcohol in a short time is called binge drinking. This can be very harmful. Drinking alcohol can also be more dangerous if you are taking medicines or other drugs. Some of the effects caused by alcohol may include:  Loss of coordination.  Changes in mood and behavior.  Unclear thinking.  Trouble talking (slurred speech).  Throwing up (vomiting).  Confusion.  Slowed breathing.  Twitching and shaking (seizures).  Loss of consciousness. HOME CARE  Do not drive after drinking alcohol.  Drink enough water and fluids to keep your pee (urine) clear or pale yellow. Avoid caffeine.  Only take medicine as told by your doctor. GET HELP IF:  You throw up (vomit) many times.  You do not feel better after a few days.  You frequently have alcohol intoxication. Your doctor can help decide if you should see a substance use treatment counselor. GET HELP RIGHT AWAY IF:  You become shaky when you stop drinking.  You have twitching and shaking.  You throw up blood. It may look bright red or like coffee grounds.  You notice blood in your poop (bowel movements).  You become lightheaded or pass out (faint). MAKE SURE YOU:   Understand these instructions.  Will watch your condition.  Will get help right away if you are not doing well or get worse. Document Released: 02/10/2008 Document Revised: 04/26/2013 Document Reviewed: 01/27/2013 [facility] Patient Information 2015 [place], [place]. This information is not intended to replace advice given to you by your health care provider. Make sure you discuss any questions you have with your health care provider.  Alcohol Use Disorder Alcohol use disorder is a mental disorder. It is not a one-time incident of heavy drinking. Alcohol use disorder is the excessive and  uncontrollable use of alcohol over time that leads to problems with functioning in one or more areas of daily living. People with this disorder risk harming themselves and others when they drink to excess. Alcohol use disorder also can cause other mental disorders, such as mood and anxiety disorders, and serious physical problems. People with alcohol use disorder often misuse other drugs.  Alcohol use disorder is common and widespread. Some people with this disorder drink alcohol to cope with or escape from negative life events. Others drink to relieve chronic pain or symptoms of mental illness. People with a family history of alcohol use disorder are at higher risk of losing control and using alcohol to excess.  SYMPTOMS  Signs and symptoms of alcohol use disorder may include the following:   Consumption ofalcohol inlarger amounts or over a longer period of time than intended.  Multiple unsuccessful attempts to cutdown or control alcohol use.   A great deal of time spent obtaining alcohol, using alcohol, or recovering from the effects of alcohol (hangover).  A strong desire or urge to use alcohol (cravings).   Continued use of alcohol despite problems at work, school, or home because of alcohol use.   Continued use of alcohol despite problems in relationships because of alcohol use.  Continued use of alcohol in situations when it is physically hazardous, such as driving a car.  Continued use of alcohol despite awareness of a physical or psychological problem that is likely related to alcohol use. Physical problems related to alcohol use can involve the brain, heart, liver, stomach, and intestines. Psychological problems related  to alcohol use include intoxication, depression, anxiety, psychosis, delirium, and dementia.   The need for increased amounts of alcohol to achieve the same desired effect, or a decreased effect from the consumption of the same amount of alcohol  (tolerance).  Withdrawal symptoms upon reducing or stopping alcohol use, or alcohol use to reduce or avoid withdrawal symptoms. Withdrawal symptoms include:  Racing heart.  Hand tremor.  Difficulty sleeping.  Nausea.  Vomiting.  Hallucinations.  Restlessness.  Seizures. DIAGNOSIS Alcohol use disorder is diagnosed through an assessment by your health care provider. Your health care provider may start by asking three or four questions to screen for excessive or problematic alcohol use. To confirm a diagnosis of alcohol use disorder, at least two symptoms must be present within a 30-month period. The severity of alcohol use disorder depends on the number of symptoms:  Mild--two or three.  Moderate--four or five.  Severe--six or more. Your health care provider may perform a physical exam or use results from lab tests to see if you have physical problems resulting from alcohol use. Your health care provider may refer you to a mental health professional for evaluation. TREATMENT  Some people with alcohol use disorder are able to reduce their alcohol use to low-risk levels. Some people with alcohol use disorder need to quit drinking alcohol. When necessary, mental health professionals with specialized training in substance use treatment can help. Your health care provider can help you decide how severe your alcohol use disorder is and what type of treatment you need. The following forms of treatment are available:   Detoxification. Detoxification involves the use of prescription medicines to prevent alcohol withdrawal symptoms in the first week after quitting. This is important for people with a history of symptoms of withdrawal and for heavy drinkers who are likely to have withdrawal symptoms. Alcohol withdrawal can be dangerous and, in severe cases, cause death. Detoxification is usually provided in a hospital or in-patient substance use treatment facility.  Counseling or talk therapy.  Talk therapy is provided by substance use treatment counselors. It addresses the reasons people use alcohol and ways to keep them from drinking again. The goals of talk therapy are to help people with alcohol use disorder find healthy activities and ways to cope with life stress, to identify and avoid triggers for alcohol use, and to handle cravings, which can cause relapse.  Medicines.Different medicines can help treat alcohol use disorder through the following actions:  Decrease alcohol cravings.  Decrease the positive reward response felt from alcohol use.  Produce an uncomfortable physical reaction when alcohol is used (aversion therapy).  Support groups. Support groups are run by people who have quit drinking. They provide emotional support, advice, and guidance. These forms of treatment are often combined. Some people with alcohol use disorder benefit from intensive combination treatment provided by specialized substance use treatment centers. Both inpatient and outpatient treatment programs are available. Document Released: 10/01/2004 Document Revised: 01/08/2014 Document Reviewed: 12/01/2012 [facility] Patient Information 2015 [place], [place]. This information is not intended to replace advice given to you by your health care provider. Make sure you discuss any questions you have with your health care provider.  Emergency Department Resource Guide 1) Find a Doctor and Pay Out of Pocket Although you won't have to find out who is covered by your insurance plan, it is a good idea to ask around and get recommendations. You will then need to call the office and see if the doctor you have chosen will accept  you as a new patient and what types of options they offer for patients who are self-pay. Some doctors offer discounts or will set up payment plans for their patients who do not have insurance, but you will need to ask so you aren't surprised when you get to your appointment.  2) Contact Your  Local Health Department Not all health departments have doctors that can see patients for sick visits, but many do, so it is worth a call to see if yours does. If you don't know where your local health department is, you can check in your phone book. The CDC also has a tool to help you locate your state's health department, and many state websites also have listings of all of their local health departments.  3) Find a Walk-in Clinic If your illness is not likely to be very severe or complicated, you may want to try a walk in clinic. These are popping up all over the country in pharmacies, drugstores, and shopping centers. They're usually staffed by nurse practitioners or physician assistants that have been trained to treat common illnesses and complaints. They're usually fairly quick and inexpensive. However, if you have serious medical issues or chronic medical problems, these are probably not your best option.  No Primary Care Doctor: - Call Health Connect at  [phone] - they can help you locate a primary care doctor that  accepts your insurance, provides certain services, etc. - Physician Referral Service- [phone]  Chronic Pain Problems: Organization         Address  Phone   Notes  [facility]  [phone] Patients need to be referred by their primary care doctor.   Medication Assistance: Organization         Address  Phone   Notes  [facility] [address] [place], [place] [address] [phone] --Must be a resident of [facility] -- Must have NO insurance coverage whatsoever (no Medicaid/ Medicare, etc.) -- The pt. MUST have a primary care doctor that directs their care regularly and follows them in the community   MedAssist  [phone]   [employer]  [phone]    Agencies that provide inexpensive medical care: Organization         Address  Phone   Notes  [facility]  [phone]  [phone]   [facility]    [phone]   [facility] [address] [place], [place] [address] [phone]   [facility] [address], [place] [phone]   Planned Parenthood    [phone]   [facility]    [phone]   [facility]  [address], [place] Phone:  [phone], Fax:  [phone] Hours of Operation:  9 am - 6 pm, M-F.  Also accepts Medicaid/Medicare and self-pay.  [facility]  [address], [place] Phone: [phone], Fax: [phone]. Hours of Operation:  8:30 am - 5:30 pm, M-F.  Also accepts Medicaid and self-pay.  [facility] [place] [address], [place] Phone: [phone]   [facility] [address], [place], [place] [phone], Ext. [phone] Mondays & Thursdays: 7-9 AM.  First 15 patients are seen on a first come, first serve basis.    Medicaid-accepting [facility] Providers:  Organization  Address  Phone   Notes  [facility] [address], [place] [phone] Also accepts self-pay patients.  [facility] [address], [place]  [phone]   [place], [address], [place] [phone]   [facility] [address], [place] [phone]   Lucianne Lei [address], [place]   [phone] Only accepts [place] Access [place] patients after they have their name applied to their card.   Self-Pay (no insurance) in [facility]:  Organization         Address  Phone   Notes  Sickle Cell Patients, [facility] [place] [phone]   [facility] [place] [phone]   [facility] [place]  [place],  [place], [place] [phone]   [facility]/Dr. Osei-Bonsu  [address] [place] or [address], [place] [phone] Phone number for both [place] and [place] locations is the same.  [facility] [address], [place] [phone]   [facility] [address], [place] or [address] [phone]   [facility] [address], [place] [phone], phone; [phone], fax Sees patients 1st and 3rd Saturday of every month.  Must not qualify for public or private insurance (i.e. Medicaid, Medicare, [place] Health Choice, Veterans' Benefits)  Household income should be no more than 200% of the poverty level The clinic cannot treat you if you are pregnant or think you are pregnant  Sexually transmitted diseases are not treated at the clinic.    Dental Care: Organization         Address  Phone  Notes  [facility] of [facility] [phone] Accepts children up to age 27 who are enrolled in [place] or [place]; pregnant women with a Medicaid card; and children who have applied for Medicaid or [place] Health Choice, but were declined, whose parents can pay a reduced fee at time of service.  [facility] Department of [facility]  [address], [place] [phone] Accepts children up to age 5 who are enrolled in [place] or [place]; pregnant women with a Medicaid card; and children who have applied for Medicaid or [place] Health Choice, but were declined, whose parents can pay a reduced fee at time of service.  [place] [facility]  [place] [phone] Patients are seen by appointment only. Walk-ins are not accepted. [place] will see patients 64 years of age and older. Monday - Tuesday (8am-5pm) Most  Wednesdays (8:30-5pm) $30 per visit, cash only  [facility] Adult [facility] Access PRO[facility]  [address], [facility] [phone] Patients are seen by appointment only. Walk-ins are not accepted. [place] will see patients 34 years of age and older. One Wednesday Evening (Monthly: Volunteer Based).  $30 per visit, cash only  [place]  [phone] for adults; Children under age 17, call Graduate Pediatric Dentistry at [phone]. Children aged 85-14, please call [phone] to request a pediatric application.  Dental services are provided in all areas of dental care including  fillings, crowns and bridges, complete and partial dentures, implants, gum treatment, root canals, and extractions. Preventive care is also provided. Treatment is provided to both adults and children. Patients are selected via a lottery and there is often a waiting list.   [facility] [address], [place]  [phone] [email or web address]   [facility] [address], [place], [place] [phone], Ext. [phone] Second and Fourth Thursday of each month, opens at 6:30 AM; Clinic ends at 9 AM.  Patients are seen on a first-come first-served basis, and a limited number are seen during each clinic.   [facility]  [address], [place], [place] [phone]   Eligibility Requirements You must have lived in [place], [place], or [place] counties for at least the last three months.   You cannot be eligible for state or federal sponsored [place] Cityhealthcare insurance, including CIGNAVeterans Administration, [place]icaid, or [employer]Medicare.   You generally cannot be eligible for healthcare insurance through your employer.    How to apply: Eligibility screenings are held every Tuesday and Wednesday afternoon from 1:00 pm until 4:00 pm. You do not need an appointment for the interview!  [facility] [address], [place], [place] [phone]    [facility]  [phone]   [facility]  [phone]   [facility]  [phone]    Behavioral Health Resources in the Community: Intensive Outpatient Programs Organization         Address  Phone  Notes  [facility] [address], [place], [place] [phone]   [facility] [address], [place], [place] [phone]   [facility] [address], [place], [place]  [phone]   [facility] [address],  [place], [place] [phone] or [phone]   Substance Abuse Resources Organization         Address  Phone  Notes  [facility]  [phone]   [facility]  [phone]   [facility]  [phone]   [facility]  [phone]   Residential & Outpatient Substance Abuse Program  [phone]   Psychological Services Organization         Address  Phone  Notes  [facility]  [phone]- [phone]   [facility]  [phone]- [phone]   [facility] [address], [place] [phone] or [phone]    Mobile Crisis Teams Organization         Address  Phone  Notes  [facility], Mobile Crisis Care Unit  [phone]   [facility]  [address] [place], [place] [phone]   Doristine LocksSharon DeEsch [address] [place] [phone]    Self-Help/Support Groups Organization         Address  Phone             Notes  [facility] [place] - variety of support groups  336- [phone] Call for more information  Narcotics Anonymous (NA), [facility] [address], [place]  2 meetings at this location   [facility]istician[facility]idential Treatment Programs Organization         Address  Phone  Notes  [facility] [address],    [place]  [phone]   [facility]  [address], [place]  [phone], [place], [place] [phone]   [facility] [address],  [place] [phone] Admissions: 8am-3pm M-F  °[facility] [address]    °[place], [place] [phone]   °[facility] [address], [place], [place] [phone]   °[facility] [address]  °[place], [place] [phone]   °[facility] - Intensive Outpatient [address], [place], [place] [phone]   °[facility] ([facility] [address]  °[place], [place] [phone] or [phone]   °[facility] ([facility]) [address] [place], [place] [phone] Accepts Medicaid  °[facility] [address]  °[place] 1-800-659-3381 Substance Abuse/Addiction Treatment  ° °[place] Behavioral Health Resources °Organization         Address  Phone  Notes  °[facility]  ([phone]) [phone]   °Julie Brannon, PhD [address] [place], [place]   ([phone]) [phone] or (336) [phone]   °[facility]   [address] °[place], [place] ([phone]) [phone]   °[facility] [address], [place], [place] ([phone]) [phone] Insurance/Medicaid/sponsorship through [facility]  °[facility] [address]                                    [place], [place] ([phone]) [phone] Therapy/tele-psych/case  °[facility] [address]  ° [place], [place] ([phone]) [phone]    °Dr. Arfeen  ([phone]) [phone]   °[facility] [place]  [employer] [place] [facility] 1) [address], [place] °2) [address], [place] °3)  [address], [place] ([phone]) [phone] °(336) [phone] ° °(336) [phone]   °[place] Child Abuse Hotline (336) 342-1394 or (336) [phone] (After Hours)    ° ° °

## 2014-08-06 NOTE — ED Notes (Signed)
Pa  at bedside. 

## 2014-08-06 NOTE — ED Notes (Signed)
Mother of pt came out to report to other RN pt's nausea and tremors increasing, EDPA aware, orders received for ativan and initiated.

## 2014-08-06 NOTE — ED Notes (Signed)
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  Pt states that she has 1 6 pack of bud light today.  Pt coherent.

## 2014-08-06 NOTE — BH Assessment (Signed)
Tele Assessment Note   Patient is a 38 year old female that requests detox from alcohol.    Patient reports that she relapsed on Friday.  Patient reports that she was sober since February 2015.  Patient reports that she drank an hour before coming to the ED.  Patient BAL is 228 and her UDS is negative.  Patient denies a history of seizures.  Patient denies withdrawal symptoms.  Patient denies outpatient therapy or medication management.  Patient denies prior detox or treatment  Patient denies SI/HI/Psychosis.  Patient denies prior psychiatric hospitalizations.  Patient reports that she began became addicted to alcohol in 012.  Patient reports that she lost 5 family members in 13 months.  Patient reports that her husband died in 2014.   Patient denies physical, sexual or emotional abuse.    Axis I: Alcohol Abuse, Major Depressive Disorder  Axis II: Deferred Axis III:  Past Medical History  Diagnosis Date  . Thyroid disease    Axis IV: other psychosocial or environmental problems, problems related to social environment and problems with primary support group Axis V: 31-40 impairment in reality testing  Past Medical History:  Past Medical History  Diagnosis Date  . Thyroid disease     Past Surgical History  Procedure Laterality Date  . Cholecystectomy    . Cesarean section    . Abdominal hysterectomy    . Appendectomy    . Tonsillectomy    . Gastric bypass      Family History: History reviewed. No pertinent family history.  Social History:  reports that she has never smoked. She does not have any smokeless tobacco history on file. She reports that she drinks alcohol. She reports that she does not use illicit drugs.  Additional Social History:     CIWA: CIWA-Ar BP: 140/99 mmHg Pulse Rate: 82 COWS:    PATIENT STRENGTHS: (choose at least two) Average or above average intelligence Capable of independent living Communication skills Financial means General fund of  knowledge Physical Health Special hobby/interest Supportive family/friends Work skills  Allergies:  Allergies  Allergen Reactions  . Ciprocinonide [Fluocinolone] Itching  . Sulfa Antibiotics Itching  . Sulfa Antibiotics     Home Medications:  (Not in a hospital admission)  OB/GYN Status:  No LMP recorded. Patient has had a hysterectomy.  General Assessment Data Location of Assessment: [facility] Is this a Tele or Face-to-Face Assessment?: Tele Assessment Is this an Initial Assessment or a Re-assessment for this encounter?: Initial Assessment Living Arrangements: Alone Can pt return to current living arrangement?: Yes Admission Status: Voluntary Is patient capable of signing voluntary admission?: Yes Transfer from: Home Referral Source: Self/Family/Friend  Medical Screening Exam [facility] Walk[facility] ONLY) Medical Ex[facility] completed: [facility] Living Arrangements: Alone Name of Psychiatrist: None Reported Name of Therapist: None Reported  Education Status Is patient currently in school?: No Current Grade: NA Highest grade of school patient has completed: NA Name of school: NA Contact person: NA  Risk to self with the past 6 months Suicidal Ideation: No Suicidal Intent: No Is patient at risk for suicide?: No Suicidal Plan?: No Access to Means: No What has been your use of drugs/alcohol within the last 12 months?: None Reported Previous Attempts/Gestures: No How many times?: 0 Other Self Harm Risks: None Reported Triggers for Past Attempts:  (None Reported) Intentional Self Injurious Behavior: None Family Suicide History: No Recent stressful life event(s): Loss (Comment) (5 people died in the span of 13 months) Persecutory  voices/beliefs?: No Depression: Yes Depression Symptoms: Despondent, Tearfulness, Isolating, Guilt Substance abuse history and/or treatment for substance abuse?: Yes Suicide prevention information given to non-admitted  patients: Not applicable  Risk to Others within the past 6 months Homicidal Ideation: No Thoughts of Harm to Others: No Current Homicidal Intent: No Current Homicidal Plan: No Access to Homicidal Means: No Identified Victim: None Reported History of harm to others?: No Assessment of Violence: None Noted Violent Behavior Description: None Reported Does patient have access to weapons?: No Criminal Charges Pending?: No Does patient have a court date: No  Psychosis Hallucinations: None noted Delusions: None noted  Mental Status Report Appear/Hygiene: Disheveled Eye Contact: Fair Motor Activity: Freedom of movement Speech: Logical/coherent Level of Consciousness: Alert Mood: Depressed Affect: Appropriate to circumstance, Blunted, Depressed Anxiety Level: None Thought Processes: Coherent, Relevant Judgement: Unimpaired Orientation: Person, Place, Time, Situation Obsessive Compulsive Thoughts/Behaviors: None  Cognitive Functioning Concentration: Normal Memory: Recent Intact, Remote Intact IQ: Average Insight: Fair Impulse Control: Fair Appetite: Fair Weight Loss: 0 Weight Gain: 0 Sleep: Decreased Total Hours of Sleep: 5 Vegetative Symptoms: Decreased grooming  ADLScreening ([facility] Assessment Services) Patient's cognitive ability adequate to safely complete daily activities?: Yes Patient able to express need for assistance with ADLs?: Yes Independently performs ADLs?: Yes (appropriate for developmental age)  Prior Inpatient Therapy Prior Inpatient Therapy: No Prior Therapy Dates: NA Prior Therapy Facilty/Provider(s): NA Reason for Treatment: NA  Prior Outpatient Therapy Prior Outpatient Therapy: No Prior Therapy Dates: NA Prior Therapy Facilty/Provider(s): NA Reason for Treatment: NA  ADL Screening (condition at time of admission) Patient's cognitive ability adequate to safely complete daily activities?: Yes Patient able to express need for assistance with  ADLs?: Yes Independently performs ADLs?: Yes (appropriate for developmental age)                  Additional Information 1:1 In Past 12 Months?: No CIRT Risk: No Elopement Risk: No Does patient have medical clearance?: Yes     Disposition: Per Dr. Gilmore LarocheAkhtar patient meets criteria for inpatient hospitalization.  Disposition Initial Assessment Completed for this Encounter: Yes Disposition of Patient: Other dispositions Other disposition(s): Other (Comment) (Pending psych disposition.  )  Phillip HealStevenson, Pixie Burgener [place] 08/06/2014 5:33 PM

## 2014-08-06 NOTE — BH Assessment (Signed)
Writer received collateral clinical information from the PA Clydie Braun(Karen).  Writer informed the PA that there may be a delay in seeing the patients due to other consults called in before her.

## 2014-08-06 NOTE — ED Notes (Signed)
TTS by camera  at bedside talking with pt

## 2014-10-30 ENCOUNTER — Encounter (HOSPITAL_COMMUNITY): Payer: Self-pay | Admitting: Psychology

## 2014-11-02 ENCOUNTER — Encounter (HOSPITAL_COMMUNITY): Payer: Self-pay | Admitting: Medical

## 2014-11-02 ENCOUNTER — Other Ambulatory Visit (HOSPITAL_COMMUNITY): Payer: 59 | Admitting: Psychology

## 2014-11-02 DIAGNOSIS — F1020 Alcohol dependence, uncomplicated: Secondary | ICD-10-CM | POA: Insufficient documentation

## 2014-11-02 DIAGNOSIS — F10239 Alcohol dependence with withdrawal, unspecified: Secondary | ICD-10-CM | POA: Insufficient documentation

## 2014-11-02 DIAGNOSIS — Z62811 Personal history of psychological abuse in childhood: Secondary | ICD-10-CM | POA: Insufficient documentation

## 2014-11-02 DIAGNOSIS — K911 Postgastric surgery syndromes: Secondary | ICD-10-CM | POA: Insufficient documentation

## 2014-11-02 DIAGNOSIS — Z62810 Personal history of physical and sexual abuse in childhood: Secondary | ICD-10-CM | POA: Insufficient documentation

## 2014-11-02 DIAGNOSIS — N951 Menopausal and female climacteric states: Secondary | ICD-10-CM | POA: Insufficient documentation

## 2014-11-02 DIAGNOSIS — Z6372 Alcoholism and drug addiction in family: Secondary | ICD-10-CM

## 2014-11-02 DIAGNOSIS — F419 Anxiety disorder, unspecified: Secondary | ICD-10-CM | POA: Insufficient documentation

## 2014-11-02 DIAGNOSIS — F4312 Post-traumatic stress disorder, chronic: Secondary | ICD-10-CM | POA: Insufficient documentation

## 2014-11-02 DIAGNOSIS — Z811 Family history of alcohol abuse and dependence: Secondary | ICD-10-CM | POA: Insufficient documentation
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
## 2014-11-05 ENCOUNTER — Encounter (HOSPITAL_COMMUNITY): Payer: Self-pay | Admitting: Psychology

## 2014-11-05 ENCOUNTER — Other Ambulatory Visit (HOSPITAL_COMMUNITY): Payer: 59 | Attending: Psychiatry | Admitting: Licensed Clinical Social Worker

## 2014-11-05 DIAGNOSIS — F4312 Post-traumatic stress disorder, chronic: Secondary | ICD-10-CM | POA: Diagnosis not present

## 2014-11-05 DIAGNOSIS — F419 Anxiety disorder, unspecified: Secondary | ICD-10-CM | POA: Diagnosis not present

## 2014-11-05 DIAGNOSIS — F10239 Alcohol dependence with withdrawal, unspecified: Secondary | ICD-10-CM | POA: Insufficient documentation

## 2014-11-05 DIAGNOSIS — Z62810 Personal history of physical and sexual abuse in childhood: Secondary | ICD-10-CM | POA: Diagnosis not present
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
## 2014-11-06 ENCOUNTER — Encounter (HOSPITAL_COMMUNITY): Payer: Self-pay | Admitting: Licensed Clinical Social Worker

## 2014-11-06 ENCOUNTER — Other Ambulatory Visit (HOSPITAL_COMMUNITY): Payer: 59 | Attending: Psychiatry

## 2014-11-06 DIAGNOSIS — F419 Anxiety disorder, unspecified: Secondary | ICD-10-CM | POA: Insufficient documentation

## 2014-11-06 DIAGNOSIS — F4312 Post-traumatic stress disorder, chronic: Secondary | ICD-10-CM | POA: Insufficient documentation

## 2014-11-06 DIAGNOSIS — F329 Major depressive disorder, single episode, unspecified: Secondary | ICD-10-CM | POA: Insufficient documentation

## 2014-11-06 DIAGNOSIS — F10239 Alcohol dependence with withdrawal, unspecified: Secondary | ICD-10-CM | POA: Insufficient documentation

## 2014-11-06 NOTE — Progress Notes (Signed)
    Daily Group Progress Note  Program: CD-IOP   Group Time: 1-2:30  Participation Level: Active  Behavioral Response: Appropriate and Sharing  Type of Therapy: Process Group  Topic: After checking in, group members took turns sharing about recovery-related activities and challenges they faced since our last group meeting.   Group members were active during this discussion and many group members engaged in feedback to other group members. Drug tests were given out to all group members. One group member was absent because he was at the hospital welcoming his grandchild.     Group Time: 2:45-4  Participation Level: Active  Behavioral Response: Appropriate and Sharing  Type of Therapy: Psycho-education Group  Topic: The second portion of the group addressed codependency, part 1. Each group member was able to explain their own definition of codependency and identify codependency behaviors. Some group members self-identified as being codependent while others reported knowing people who are codependent. Group members discussed patterns in relationships which may exhibit codependent behaviors. The second part of codependency psychoeducation will be continued at the next group. One group member reported frustration that some group members, as well as some people in AA, have a lot of excuses for not going to meetings. Two drug test results were returned.   Summary:  This is the 2nd group for the patient and she did not go to any meetings this weekend. She has never been to any AA meetings and is hesitant about going. Several group members gave her feedback about what to expect at an AA meeting. She reports she went to the gym daily and cleaned out her deceased x-husband's clothes this weekend. She reports she has eliminated stressors in her life which used to trigger her to drink. She and another group member exchanged words about the importance of attending meetings and not having excuses. The  patient was open to suggestions from other group members. The patient reported honestly that she thinks she has a codependency relationship with everyone she knows. She feels like she is a caregiver for everyone. Her sobriety date remains 2/17.    Family Program: Family present? No   Name of family member(s):   UDS collected: Yes Results: Pending  AA/NA attended?: No  Sponsor?: No   Zimir Kittleson S, Licensed Cli

## 2014-11-07 ENCOUNTER — Other Ambulatory Visit (HOSPITAL_COMMUNITY): Payer: 59 | Admitting: Psychology

## 2014-11-07 DIAGNOSIS — Z62810 Personal history of physical and sexual abuse in childhood: Secondary | ICD-10-CM

## 2014-11-07 DIAGNOSIS — F10239 Alcohol dependence with withdrawal, unspecified: Secondary | ICD-10-CM | POA: Diagnosis present

## 2014-11-07 DIAGNOSIS — F419 Anxiety disorder, unspecified: Secondary | ICD-10-CM | POA: Diagnosis not present

## 2014-11-07 DIAGNOSIS — F4312 Post-traumatic stress disorder, chronic: Secondary | ICD-10-CM | POA: Diagnosis not present

## 2014-11-07 DIAGNOSIS — F329 Major depressive disorder, single episode, unspecified: Secondary | ICD-10-CM | POA: Diagnosis not present

## 2014-11-08 ENCOUNTER — Encounter (HOSPITAL_COMMUNITY): Payer: Self-pay | Admitting: Psychology

## 2014-11-08 ENCOUNTER — Other Ambulatory Visit (HOSPITAL_COMMUNITY): Payer: 59

## 2014-11-08 NOTE — Progress Notes (Signed)
    Daily Group Progress Note  Program: CD-IOP   Group Time: 1-2:30 pm  Participation Level: Active  Behavioral Response: Sharing  Type of Therapy: Process Group  Topic: After checking in, group members took turns sharing about any recovery related activities they engaged in since the last group meeting, as well as any challenges they may have faced.  The group then continued a discussion about "Codependency" and group members shared about their own relationships and the codependent behaviors they recognize in their lives. Drug test results collected on Monday were returned and all were negative.   Group Time: 2:45- 4pm  Participation Level: Active  Behavioral Response: Sharing  Type of Therapy: Psycho-education Group  Topic: Psychoeducation/Graduation:   During the second part of group, the discussion about "Codependency" continued.  Group members continued to share how the topic manifests itself in their lives and provided specific examples.  There was good feedback among members. As the session neared an end, a graduation ceremony was held for a group member who was successfully completing the program today. There were kind words of hope and validation provided to this man as he leaves this stage of recovery and moves onto the next phase of his new life.   Summary: The patient stated that she had car trouble on Monday and has not been feeling well.  On Tuesday, she was in bed all day with a sinus infection, but completed some crafts while she rested.  This morning, the patient went to the eye doctor with her daughter. The patient shared about a discussion she had had with her mother. She explained that she must begin setting boundaries with her mother. The patient stated that she is "the mother" in their relationship and that boundaries are necessary.  She stated that she has been codependent since she was a child and has been taking care of everyone since childhood.  She also reported  trying to "fix everyone's problems".  She has not yet attended any AA meetings, but reported she has a session here tomorrow morning with her individual counselor. The patient's sobriety date is 2/17.   Family Program: Family present? No   Name of family member(s):   UDS collected: No Results:   AA/NA attended?: No  Sponsor?: No   Megin Consalvo, LCAS

## 2014-11-09 ENCOUNTER — Other Ambulatory Visit (HOSPITAL_COMMUNITY): Payer: 59

## 2014-11-12 ENCOUNTER — Other Ambulatory Visit (HOSPITAL_COMMUNITY): Payer: 59

## 2014-11-13 ENCOUNTER — Other Ambulatory Visit (HOSPITAL_COMMUNITY): Payer: 59

## 2014-11-13 ENCOUNTER — Telehealth (HOSPITAL_COMMUNITY): Payer: Self-pay | Admitting: Psychology

## 2014-11-14 ENCOUNTER — Other Ambulatory Visit (HOSPITAL_COMMUNITY): Payer: 59

## 2014-11-15 ENCOUNTER — Encounter (HOSPITAL_COMMUNITY): Payer: Self-pay | Admitting: Psychology

## 2014-11-15 ENCOUNTER — Other Ambulatory Visit (HOSPITAL_COMMUNITY): Payer: 59

## 2014-11-15 NOTE — Progress Notes (Signed)
Julie Conley is a 39 y.o. female patient. CD-IOP: Discharge. The patient has not returned to treatment and has not responded to phone calls inquiring about her well-being. She has now missed 2 consecutive group sessions and is discharged, unsuccessful, from the CD-IOP. She attended a total of 3 group sessions.       Brittainy Bucker, LCAS

## 2014-11-16 ENCOUNTER — Other Ambulatory Visit (HOSPITAL_COMMUNITY): Payer: 59

## 2014-11-19 ENCOUNTER — Other Ambulatory Visit (HOSPITAL_COMMUNITY): Payer: 59

## 2014-11-20 ENCOUNTER — Other Ambulatory Visit (HOSPITAL_COMMUNITY): Payer: 59

## 2014-11-21 ENCOUNTER — Other Ambulatory Visit (HOSPITAL_COMMUNITY): Payer: 59

## 2014-11-22 ENCOUNTER — Other Ambulatory Visit (HOSPITAL_COMMUNITY): Payer: 59

## 2014-11-23 ENCOUNTER — Other Ambulatory Visit (HOSPITAL_COMMUNITY): Payer: 59

## 2014-11-23 ENCOUNTER — Other Ambulatory Visit (HOSPITAL_COMMUNITY): Payer: 59 | Attending: Psychiatry | Admitting: Psychology

## 2014-11-23 ENCOUNTER — Other Ambulatory Visit (HOSPITAL_COMMUNITY): Payer: Self-pay | Admitting: Medical

## 2014-11-23 DIAGNOSIS — Z811 Family history of alcohol abuse and dependence: Secondary | ICD-10-CM | POA: Diagnosis not present

## 2014-11-23 DIAGNOSIS — F329 Major depressive disorder, single episode, unspecified: Secondary | ICD-10-CM | POA: Insufficient documentation

## 2014-11-23 DIAGNOSIS — F419 Anxiety disorder, unspecified: Secondary | ICD-10-CM | POA: Diagnosis not present

## 2014-11-23 DIAGNOSIS — F10239 Alcohol dependence with withdrawal, unspecified: Secondary | ICD-10-CM | POA: Insufficient documentation

## 2014-11-23 DIAGNOSIS — F4312 Post-traumatic stress disorder, chronic: Secondary | ICD-10-CM | POA: Insufficient documentation

## 2014-11-23 DIAGNOSIS — Z62810 Personal history of physical and sexual abuse in childhood: Secondary | ICD-10-CM

## 2014-11-23 MED ORDER — TRAZODONE HCL 50 MG PO TABS: 50.0000 mg | ORAL_TABLET | Freq: Every evening | ORAL | Status: DC | PRN

## 2014-11-26 ENCOUNTER — Encounter (HOSPITAL_COMMUNITY): Payer: Self-pay | Admitting: Psychology

## 2014-11-26 ENCOUNTER — Other Ambulatory Visit (HOSPITAL_COMMUNITY): Payer: 59

## 2014-11-26 ENCOUNTER — Other Ambulatory Visit (HOSPITAL_COMMUNITY): Payer: 59 | Admitting: Psychology

## 2014-11-26 DIAGNOSIS — F10239 Alcohol dependence with withdrawal, unspecified: Secondary | ICD-10-CM | POA: Diagnosis not present
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
## 2014-11-27 ENCOUNTER — Other Ambulatory Visit (HOSPITAL_COMMUNITY): Payer: 59

## 2014-11-27 ENCOUNTER — Encounter (HOSPITAL_COMMUNITY): Payer: Self-pay | Admitting: Psychology

## 2014-11-27 NOTE — Progress Notes (Addendum)
    Daily Group Progress Note  Program: CD-IOP   Group Time: 1-2:30 pm  Participation Level: Active  Behavioral Response: Appropriate and Sharing  Type of Therapy: Process Group  Topic: Process.: The first part of group was spent in process. Members shared about their activities relative to recovery that they did over the weekend. One member checked in with a new sobriety date. After the check-in, her weekend was diagrammed on the board. Group member were able to offer good feedback about what she might have done differently to avoid relapsing. There was good disclosure among members and the member with the new sobriety date was quite receptive to the comments.   Group Time: 2:45- 4pm  Participation Level: Active  Behavioral Response: Sharing  Type of Therapy: Psycho-education Group  Topic: Pros & Cons of Using VS Sobriety/Graduation: the second half of group was spent in a psycho-ed. A grid was drawn on the board and group members invited to identify the pros and cons of active addiction versus the pros and cons of sobriety. Members easily identified many cons of drinking and drugging. They identified a number of pros as well. On the sobriety side, there were many pros, but very few cons. As the end of group approached, a graduation ceremony was held honoring a successfully departing group member. She has been a Clinical biochemistreal leader of the group and very committed to her recovery and healing.  There were very thoughtful and kind words shared by all.   Summary: The patient reported a chaotic weekend. She had attended only 1 AA meeting, but stated that the "Serendipity" meeting on Saturday morning was a very good meeting. She encouraged others to consider attending the meeting. She did report having spoken to her sponsor everyday over the weekend and noted she has agreed to always call and not text or email. The patient reported she had cooked a big meal on Sunday and I pointed out that this is  somewhat like a meditation for her. The patient agreed that cooking is a very relaxing thing for her. The patient identified a con of sobriety as 'losing friends". A con of drinking is the legal problems that she has recently encountered along with the financial problems that will come with her 2 DWI's. The patient had kind words for the graduating member and wished her well. The patient made some good comments and she responded well to this intervention. Her sobriety date remains 3/14.  Family Program: Family present? No   Name of family member(s):   UDS collected: No Results:   AA/NA attended?: YesSaturday  Sponsor?: Yes   Sayre Mazor, LCAS

## 2014-11-28 ENCOUNTER — Other Ambulatory Visit (HOSPITAL_COMMUNITY): Payer: 59 | Admitting: Psychology

## 2014-11-28 ENCOUNTER — Other Ambulatory Visit (HOSPITAL_COMMUNITY): Payer: 59

## 2014-11-28 DIAGNOSIS — F10239 Alcohol dependence with withdrawal, unspecified: Secondary | ICD-10-CM

## 2014-11-28 DIAGNOSIS — Z62810 Personal history of physical and sexual abuse in childhood: Secondary | ICD-10-CM

## 2014-11-29 ENCOUNTER — Other Ambulatory Visit (HOSPITAL_COMMUNITY): Payer: 59

## 2014-11-29 ENCOUNTER — Encounter (HOSPITAL_COMMUNITY): Payer: Self-pay | Admitting: Psychology

## 2014-11-29 NOTE — Progress Notes (Signed)
    Daily Group Progress Note  Program: CD-IOP   Group Time: 1-2:30 pm  Participation Level: Active  Behavioral Response: Appropriate and Sharing  Type of Therapy: Process Group  Topic: After checking in, group members took turns sharing about recovery-related activities and challenges they faced since our last group meeting.   Group members were active during this discussion and many members engaged in feedback to other group members. Drug tests were collected from all group members. Two group members were out sick today.   Group Time: 2:45- 4pm  Participation Level: Active  Behavioral Response: Sharing, Monopolizing and Intrusive  Type of Therapy: Psycho-education Group  Topic: The second part of group focused on "The Wheel of Life." The exercise measures the client's level of satisfaction in eight different sections or elements of life.  Together, they represent one way of describing a balanced whole life.  By drawing their wheels, members can see where they might need to apply themselves in order to find more balance. Each member drew his/her wheel on the board and shared how they came to measure their satisfaction with each category. There was good disclosure and feedback among group members.   Summary: The patient reported she had attended 1 AA meeting since the last group session and spoken with her sponsor daily. She provided good feedback to her fellow group member by explaining that music and cooking are ways she addresses her anxiety. The patient shared her wheel with the group and explained that her career was a 1, but she has not been working for [employer]ile and walked off her last job because of her drinking. The patient identified a fairly "healthy" wheel other than that. The patient displays a good commitment to her recovery, but her family is total chaos and she has very poor social skills in group. The patient's sobriety date remains 3/14.   Family Program: Family present?  No   Name of family member(s):   UDS collected: Yes Results: pending  AA/NA attended?: YesTuesday  Sponsor?: Yes   Sanad Fearnow, LCAS

## 2014-11-30 ENCOUNTER — Other Ambulatory Visit (HOSPITAL_COMMUNITY): Payer: 59

## 2014-11-30 ENCOUNTER — Other Ambulatory Visit (HOSPITAL_COMMUNITY): Payer: 59 | Admitting: Licensed Clinical Social Worker

## 2014-11-30 ENCOUNTER — Encounter (HOSPITAL_COMMUNITY): Payer: Self-pay | Admitting: Medical

## 2014-11-30 DIAGNOSIS — F4312 Post-traumatic stress disorder, chronic: Secondary | ICD-10-CM

## 2014-11-30 DIAGNOSIS — Z6372 Alcoholism and drug addiction in family: Secondary | ICD-10-CM

## 2014-11-30 DIAGNOSIS — F54 Psychological and behavioral factors associated with disorders or diseases classified elsewhere: Secondary | ICD-10-CM | POA: Insufficient documentation

## 2014-11-30 DIAGNOSIS — F10239 Alcohol dependence with withdrawal, unspecified: Secondary | ICD-10-CM

## 2014-11-30 DIAGNOSIS — Z811 Family history of alcohol abuse and dependence: Secondary | ICD-10-CM

## 2014-11-30 MED ORDER — ESCITALOPRAM OXALATE 20 MG PO TABS: 20.0000 mg | ORAL_TABLET | Freq: Every day | ORAL | Status: DC
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
## 2014-12-03 ENCOUNTER — Other Ambulatory Visit (HOSPITAL_COMMUNITY): Payer: 59 | Admitting: Licensed Clinical Social Worker

## 2014-12-03 ENCOUNTER — Other Ambulatory Visit (HOSPITAL_COMMUNITY): Payer: 59

## 2014-12-03 ENCOUNTER — Encounter (HOSPITAL_COMMUNITY): Payer: Self-pay | Admitting: Licensed Clinical Social Worker

## 2014-12-03 DIAGNOSIS — F10239 Alcohol dependence with withdrawal, unspecified: Secondary | ICD-10-CM | POA: Diagnosis not present

## 2014-12-03 NOTE — Progress Notes (Addendum)
    Daily Group Progress Note  Program: CD-IOP   Group Time: 1-2:30  Participation Level: Active  Behavioral Response: Appropriate and Sharing  Type of Therapy: Process Group  Topic:After checking in, group members took turns sharing about recovery-related activities and challenges they faced since our last group meeting.   Group members were active during this discussion and many group members engaged in feedback to other group members. Drug test results were given out. New drug tests were given out to each group member. One member reported he relapsed and good suggestions and feedback was given to that group member.    Group Time: 2:45-4  Participation Level: Active  Behavioral Response: Appropriate and Sharing  Type of Therapy: Psycho-education Group  Topic: The second half of group focused on encouraging new ways of thinking about and responding to anger. Each patient was able to report their physical cues and emotional responses to anger. It was pointed out during group that most people cover up emotions by responding with anger. Group members identified these emotions and agreed that they do cover up other emotions with anger. They also completed a worksheet on personal triggers that led to anger and frustration.   Summary:The patient went to a meeting Wednesday and met with her sponsor. She is still having some family issues with the boundaries that she has put in place with her children. The patient reported her fianc had moved back into the home. The patient was very engaged in the group process today and was able to identify situations with her family that triggers her anger. The patient reported she learned to deal with her anger by what she witnessed as a child with her mother. She is trying very hard to change the way she now expresses her anger with her children. The intervention was effective with the patient looking at new ways to express her own anger. Her sobriety date  remains 3/14.    Family Program: Family present? NOYes   Name of family member(s):   UDS collected: No Results:   AA/NA attended?: YesWednesday and Thursday  Sponsor?: Yes   MACKENZIE,LISBETH S, Licensed Cli

## 2014-12-04 ENCOUNTER — Other Ambulatory Visit (HOSPITAL_COMMUNITY): Payer: 59

## 2014-12-05 ENCOUNTER — Encounter: Payer: Self-pay | Admitting: Psychiatry

## 2014-12-05 ENCOUNTER — Other Ambulatory Visit (HOSPITAL_COMMUNITY): Payer: 59

## 2014-12-05 ENCOUNTER — Other Ambulatory Visit (HOSPITAL_COMMUNITY): Payer: 59 | Admitting: Psychology

## 2014-12-05 DIAGNOSIS — F4312 Post-traumatic stress disorder, chronic: Secondary | ICD-10-CM

## 2014-12-05 DIAGNOSIS — F10239 Alcohol dependence with withdrawal, unspecified: Secondary | ICD-10-CM | POA: Diagnosis not present

## 2014-12-06 ENCOUNTER — Other Ambulatory Visit (HOSPITAL_COMMUNITY): Payer: 59

## 2014-12-06 ENCOUNTER — Encounter (HOSPITAL_COMMUNITY): Payer: Self-pay | Admitting: Psychology
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
## 2014-12-07 ENCOUNTER — Other Ambulatory Visit (HOSPITAL_COMMUNITY): Payer: 59

## 2014-12-07 ENCOUNTER — Other Ambulatory Visit (HOSPITAL_COMMUNITY): Payer: 59 | Admitting: Psychology

## 2014-12-07 DIAGNOSIS — F1020 Alcohol dependence, uncomplicated: Secondary | ICD-10-CM | POA: Diagnosis present

## 2014-12-07 DIAGNOSIS — F4312 Post-traumatic stress disorder, chronic: Secondary | ICD-10-CM | POA: Diagnosis not present

## 2014-12-07 DIAGNOSIS — F10239 Alcohol dependence with withdrawal, unspecified: Secondary | ICD-10-CM

## 2014-12-07 DIAGNOSIS — F419 Anxiety disorder, unspecified: Secondary | ICD-10-CM | POA: Insufficient documentation

## 2014-12-09 ENCOUNTER — Encounter (HOSPITAL_COMMUNITY): Payer: Self-pay | Admitting: Psychology

## 2014-12-09 NOTE — Progress Notes (Signed)
    Daily Group Progress Note  Program: CD-IOP   Group Time: 1-2:30 pm  Participation Level: Active  Behavioral Response: Appropriate and Sharing  Type of Therapy: Process Group  Topic: Process: the first half of group was spent in process. Members shared about issues and challenges they are dealing with in early recovery. One member shared about how he had met with his boss and despite appearing to be supportive, he promptly reduced his pay by thousands of dollars. Other members became furious and were very confused when this member appeared at peace and with a plan. A good discussion followed. During group today, the Market researcher met with 3 group members.   Group Time: 2:45- 4pm  Participation Level: Active  Behavioral Response: Appropriate and Sharing  Type of Therapy: Psycho-education Group  Topic: Psycho-Ed: the second half of group was spent in a psycho-ed. Members were provided with a handout entitled, "Where Do I Get Help". it asked that names be identified under different categories, including family, friends, school, health care ser4vices, etc. the idea behind the handout is to examine where your supports currently reside and work towards enlarging and strengthening them. After a few minutes, members were asked to identify their supports and the categories or areas they come from. Only a few members had a very significant network and few had expanded theirs since entering recovery. The importance of connections within the recovery community was emphasized in this session.   Summary: The patient reported she had attended 2 AA meetings since the last group session. She had made a birthday cake for her sponsor and attended a meeting with her where they celebrated her birthday. The patient made numerous comments about what she had done for others and was oblivious so the 'bragging' this represented. However, it did not appear to go unnoticed by other group members. The patient  discussed something she had been told by her sponsor and shared that the [place] is all she needs and it is really very simple. Other members responded to this disclosure and one confirmed that she had been told to get out of her head and focus on the clear message from the [place]. In the psycho-ed, the patient identified people within her family, but I pointed out that almost every one of them, including her mother, have literally supported her drinking and bring chaos to her life. I wondered how they could be sources of support? The patient was only able to identify 2 long-time friends and some women in [place], including her sponsor, who are positive and reliable sources of support for her recovery. The patient is doing everything we have asked of her and she continues to make good progress in her recovery. The patient's sobriety date remains 3/14.   Family Program: Family present? No   Name of family member(s):   UDS collected: No Results:   AA/NA attended?: YesMonday and Tuesday  Sponsor?: Yes   Johncharles Fusselman, LCAS

## 2014-12-10 ENCOUNTER — Other Ambulatory Visit (HOSPITAL_COMMUNITY): Payer: 59 | Attending: Psychiatry | Admitting: Licensed Clinical Social Worker

## 2014-12-10 ENCOUNTER — Other Ambulatory Visit (HOSPITAL_COMMUNITY): Payer: 59

## 2014-12-10 DIAGNOSIS — F1020 Alcohol dependence, uncomplicated: Secondary | ICD-10-CM | POA: Diagnosis not present

## 2014-12-10 DIAGNOSIS — F10239 Alcohol dependence with withdrawal, unspecified: Secondary | ICD-10-CM

## 2014-12-11 ENCOUNTER — Other Ambulatory Visit (HOSPITAL_COMMUNITY): Payer: 59

## 2014-12-11 ENCOUNTER — Encounter (HOSPITAL_COMMUNITY): Payer: Self-pay | Admitting: Licensed Clinical Social Worker

## 2014-12-11 NOTE — Progress Notes (Signed)
    Daily Group Progress Note  Program: CD-IOP   Group Time: 1-2:30  Participation Level: Active  Behavioral Response: Appropriate and Sharing  Type of Therapy: Process Group  Topic: After checking in, group members took turns sharing about recovery-related activities and challenges they faced since our last group meeting.   Group members were active during this discussion and many group members engaged in feedback to other group members. Drug tests were given out to each group member. Two group members were absent. One group member went back to work today and another group member is sick.     Group Time: 2:45-4  Participation Level: Active  Behavioral Response: Appropriate and Sharing  Type of Therapy: Psycho-education Group  Topic: The second part of group was about emotional buttons. Emotional buttons are related to our social needs. We all want to be loved, accepted and approved by others. These are normal healthy human needs. However, when these needs become too intense, they are no longer healthy and can overtake our lives. People that are closest to [place] know how to push our buttons. Each group participant was given a handout on the emotional buttons and each patient was able to identify their personal emotional buttons. Group members appeared very honest in this activity.    Summary: Caryl Pina:  Patient reported she went to a meeting Friday night and then did yard work most of the weekend. She talks to her sponsor every day. On Saturday she took her daughter to get mani/pedi and spent quality "girl" time with her. She reported it felt good to spend quality time with her daughter since they haven't been getting along very well lately. The patient reported her buttons. The first button is catastrophe button with her children and mother. She was afraid when she met her fianc he would not want to be with her because of her family relationships that could be misunderstood - children,  mother. She also pointed out her other button - control. She reports "no one can do it as good as me." Other group members about this button as well. The patient appeared open and honest in the activity. Her sobriety date remains 3/14.    Family Program: Family present? No   Name of family member(s):   UDS collected: Yes Results: Pending  AA/NA attended?: YesFriday  Sponsor?: Yes   Jovannie Ulibarri S, Licensed Cli

## 2014-12-12 ENCOUNTER — Other Ambulatory Visit (HOSPITAL_COMMUNITY): Payer: 59

## 2014-12-12 ENCOUNTER — Other Ambulatory Visit (HOSPITAL_COMMUNITY): Payer: 59 | Admitting: Psychology

## 2014-12-12 DIAGNOSIS — F10239 Alcohol dependence with withdrawal, unspecified: Secondary | ICD-10-CM

## 2014-12-12 DIAGNOSIS — F4312 Post-traumatic stress disorder, chronic: Secondary | ICD-10-CM

## 2014-12-12 DIAGNOSIS — F1020 Alcohol dependence, uncomplicated: Secondary | ICD-10-CM | POA: Diagnosis not present

## 2014-12-13 ENCOUNTER — Other Ambulatory Visit (HOSPITAL_COMMUNITY): Payer: 59

## 2014-12-13 ENCOUNTER — Encounter (HOSPITAL_COMMUNITY): Payer: Self-pay | Admitting: Psychology

## 2014-12-13 NOTE — Progress Notes (Signed)
    Daily Group Progress Note  Program: CD-IOP   Group Time: 1-2:30 pm  Participation Level: Minimal  Behavioral Response: Sharing  Type of Therapy: Process Group  Topic: Process: the first part of group was spent in process. Members shared about any struggles or challenges they have experienced in early recovery. Two group members were present who had missed the last session and they were asked to catch the group up with their lives in early recovery. Drug tests were returned from Monday and collected from the 2 members who had missed the previous session.   Group Time: 2:45- 4pm  Participation Level: None  Behavioral Response: patient left after the break because she was feeling ill  Type of Therapy: Psycho-education Group  Topic: "Emotional Buttons", Part II: the second half of group was spent in a psycho-ed. It was a continuation of the previous session on "Emotional Buttons". The handout was passed out to those that did not bring theirs with them today and the handout was continued where we had left off on Monday. The "Control Button" was discussed at length with members providing examples of their efforts to control others, events or situations. Also discussed were the Rescue Button, Helplessness and Injustice Buttons. There was a good discussion with excellent feedback among members and some very good points made by group members.   Summary: The patient reported she had attended 2-AA meetings since the last group session and spoken with her sponsor daily. She explained that she will go to a meeting tomorrow, but  meet with her sponsor for about an hour before the meeting. She has to attend court on Friday with her daughter and hoped she would be out of there and be able to get to this group on time. I encouraged her to come whenever she is able to get here. She complained about having some sort of cold and getting sick and a migraine seemed to be coming. The patient said very  little for the remainder of the session and about 2:45 she left complaining about feeling so badly. Her sobriety date remains 3/14.   Family Program: Family present? No   Name of family member(s):   UDS collected: No Results:   AA/NA attended?: YesMonday and Tuesday  Sponsor?: Yes   Britani Beattie, LCAS

## 2014-12-14 ENCOUNTER — Telehealth (HOSPITAL_COMMUNITY): Payer: Self-pay | Admitting: Psychology

## 2014-12-14 ENCOUNTER — Other Ambulatory Visit (HOSPITAL_COMMUNITY): Payer: 59 | Admitting: Psychology

## 2014-12-14 DIAGNOSIS — F4312 Post-traumatic stress disorder, chronic: Secondary | ICD-10-CM

## 2014-12-14 DIAGNOSIS — F10239 Alcohol dependence with withdrawal, unspecified: Secondary | ICD-10-CM

## 2014-12-14 DIAGNOSIS — F1020 Alcohol dependence, uncomplicated: Secondary | ICD-10-CM | POA: Diagnosis not present

## 2014-12-17 ENCOUNTER — Other Ambulatory Visit (HOSPITAL_COMMUNITY): Payer: 59

## 2014-12-17 ENCOUNTER — Encounter (HOSPITAL_COMMUNITY): Payer: Self-pay | Admitting: Psychology

## 2014-12-17 NOTE — Progress Notes (Signed)
    Daily Group Progress Note  Program: CD-IOP   Group Time: 1-2:30 pm  Participation Level: Active  Behavioral Response: Appropriate  Type of Therapy: Psycho-education Group  Topic: Psycho-Ed: The first half of group was spent in a psycho-ed on the topic of "Grief and Loss". Appearing in group today, was s staff member from [facility]. She invited group members to share about their own experiences with grief and loss. A number of members shared about their experiences and there was good discussion and feedback among the group. During the session, the program director pulled out the new group members and 2 who are graduating soon.  Group Time: 2:45- 4pm  Participation Level: Active  Behavioral Response: Appropriate and Sharing  Type of Therapy: Process Group  Topic: Process/Graduation; The second half of group was spent in process. Members shared about their struggles and challenges in early recovery. The two new group members were asked to briefly introduce themselves and shared what they hope to get from the group. As the session came to an end, a graduation ceremony was held for a member who was completing the program today. The graduation medallion was passed around the words of hope and encouragement directed towards her. The session proved very insightful with good disclosure among group members, old and new.   Summary: The patient was attentive and shared about having had 3 losses in less than one year. They included her brother's drug overdose, her best friend's death, and her husband's death. She also noted how her in-laws had become distant after his death and she and her children no longer have a relationship with any of them. This is another loss. In process, the patient reported she had gone to court with her daughter today and, as anticipated, her case had been continued until May 26th. The patient reported she is feeling better, but except for attending an AA  meeting and spending time with her sponsor working with the [employer], she had rested and slept since the last group. She is going to [place] early Monday and hopes to rid herself of the cold and sinus infection she is suffering with. The patient provided very good feedback to the group member who admitted relapsing earlier in the week. She reminded him that of all places, the group is the place where he can feel safe and not judged because, "we know what it is like and how you feel". She shared kind words of hope and congratulated the graduating member. The patient responded well to this intervention and is making good progress in her recovery. Her sobriety date remains 3/14.    Family Program: Family present? No   Name of family member(s):   UDS collected: No Results:  AA/NA attended?: YesThursday  Sponsor?: Yes   Julie Conley, LCAS

## 2014-12-18 NOTE — Progress Notes (Signed)
Julie Conley is a 39 y.o. female patient. CD-IOP: The patient has been excused to travel to [place] which she had previously committed to with her fiance and their honeymoon. Although they delayed the wedding because of her relapses, the honeymoon was bought and paid for and the reservations could not be changed. She will be excused for the next 5 group sessions and return on Friday, April 22nd.         Vannak Montenegro, LCAS

## 2014-12-19 ENCOUNTER — Other Ambulatory Visit (HOSPITAL_COMMUNITY): Payer: 59

## 2014-12-20 ENCOUNTER — Encounter: Payer: Self-pay | Admitting: Psychiatry

## 2014-12-21 ENCOUNTER — Other Ambulatory Visit (HOSPITAL_COMMUNITY): Payer: 59

## 2014-12-24 ENCOUNTER — Other Ambulatory Visit (HOSPITAL_COMMUNITY): Payer: 59

## 2014-12-26 ENCOUNTER — Other Ambulatory Visit (HOSPITAL_COMMUNITY): Payer: 59

## 2014-12-28 ENCOUNTER — Other Ambulatory Visit (HOSPITAL_COMMUNITY): Payer: 59

## 2014-12-31 ENCOUNTER — Other Ambulatory Visit (HOSPITAL_COMMUNITY): Payer: 59

## 2015-01-02 ENCOUNTER — Other Ambulatory Visit (HOSPITAL_COMMUNITY): Payer: 59

## 2015-01-03 ENCOUNTER — Encounter (HOSPITAL_COMMUNITY): Payer: Self-pay | Admitting: Psychology

## 2015-01-03 ENCOUNTER — Telehealth (HOSPITAL_COMMUNITY): Payer: Self-pay | Admitting: Psychology

## 2015-01-04 ENCOUNTER — Encounter (HOSPITAL_COMMUNITY): Payer: Self-pay | Admitting: Medical

## 2015-01-04 ENCOUNTER — Other Ambulatory Visit (HOSPITAL_COMMUNITY): Payer: 59 | Admitting: Medical

## 2015-01-04 NOTE — Progress Notes (Signed)
  [facility] Summary   Julie Conley [id number]  Date of Admission: 11/02/2014 Date of Discharge: 12/28/2014  Course of Treatment: Pt entered treatment for 2nd time on 11/02/2014.She claimed she needed to leave treatment for 2 weeks inorder not to lose paid for trip to [place] which was originally to be her honeymoon.Marriage was cancelled due to her alcoholism but her fiancee had returned and she was taking trip with him.She failed to return as promised on 4//10/2014/She failed to call. She failed to return calls.This constitutes violation of her treatment agreement and she is discharge with the STRONGEST RECOMMENDATION THAT SHE SEEK INPATIENT CARE>  Goals and Activities to Help Maintain Sobriety: 1. Stay away from old friends who continue to drink and use mind-altering chemicals. 2. Continue practicing Fair Fighting rules in interpersonal conflicts. 3. Continue alcohol and drug refusal skills and call on support systems. 4. GET TREATMENT (INPT)  [facility]   Aftercare services:  1. Attend AA/NA meetings daily. 2. Obtain a sponsor and a home group in AA/NA. 3. Return to Psychotherapist:   Next appointment: NA  Plan of Action to Address Continuing Problems: INPATIENT RESIDENTIAL CARE    Client has NOT participated in the development of this discharge plan and has received a copy of this completed plan  Court JoyKOBER, [name]  01/04/2015   Court JoyKOBER, [name], PA-C 01/04/2015

## 2015-01-07 ENCOUNTER — Other Ambulatory Visit (HOSPITAL_COMMUNITY): Payer: 59

## 2015-01-09 ENCOUNTER — Other Ambulatory Visit (HOSPITAL_COMMUNITY): Payer: 59

## 2015-01-11 ENCOUNTER — Other Ambulatory Visit (HOSPITAL_COMMUNITY): Payer: 59

## 2015-01-14 ENCOUNTER — Other Ambulatory Visit (HOSPITAL_COMMUNITY): Payer: 59

## 2015-01-15 NOTE — Progress Notes (Signed)
Julie Conley is a 39 y.o. female patient. CD-IOP: Unsuccessful Discharge. The patient was enrolled in the CD-IOP and attended 10 group sessions. She was allowed to go on a vacation that had been planned and paid for long in advance, but agreed that she would return to group on April 22. She never returned nor did she call to explain her absence. She did not return phone calls. She is discharged unsuccessful from the CD-IOP.        Henderson Frampton, LCAS

## 2015-01-16 ENCOUNTER — Other Ambulatory Visit (HOSPITAL_COMMUNITY): Payer: 59

## 2015-01-18 ENCOUNTER — Other Ambulatory Visit (HOSPITAL_COMMUNITY): Payer: 59

## 2015-01-21 ENCOUNTER — Other Ambulatory Visit (HOSPITAL_COMMUNITY): Payer: 59

## 2015-01-23 ENCOUNTER — Other Ambulatory Visit (HOSPITAL_COMMUNITY): Payer: 59

## 2015-01-25 ENCOUNTER — Other Ambulatory Visit (HOSPITAL_COMMUNITY): Payer: 59

## 2015-01-28 ENCOUNTER — Other Ambulatory Visit (HOSPITAL_COMMUNITY): Payer: 59

## 2015-01-30 ENCOUNTER — Other Ambulatory Visit (HOSPITAL_COMMUNITY): Payer: 59

## 2015-02-01 ENCOUNTER — Other Ambulatory Visit (HOSPITAL_COMMUNITY): Payer: 59

## 2015-02-06 ENCOUNTER — Other Ambulatory Visit (HOSPITAL_COMMUNITY): Payer: 59

## 2015-02-08 ENCOUNTER — Other Ambulatory Visit (HOSPITAL_COMMUNITY): Payer: 59

## 2015-02-11 ENCOUNTER — Other Ambulatory Visit (HOSPITAL_COMMUNITY): Payer: 59

## 2015-02-13 ENCOUNTER — Other Ambulatory Visit (HOSPITAL_COMMUNITY): Payer: 59

## 2015-02-13 ENCOUNTER — Emergency Department (HOSPITAL_BASED_OUTPATIENT_CLINIC_OR_DEPARTMENT_OTHER)
Admission: EM | Admit: 2015-02-13 | Discharge: 2015-02-14 | Disposition: A | Discharge status: Home / Self Care | Payer: 59 | Attending: Emergency Medicine | Admitting: Emergency Medicine

## 2015-02-13 ENCOUNTER — Encounter (HOSPITAL_BASED_OUTPATIENT_CLINIC_OR_DEPARTMENT_OTHER): Payer: Self-pay | Admitting: *Deleted

## 2015-02-13 DIAGNOSIS — Z87828 Personal history of other (healed) physical injury and trauma: Secondary | ICD-10-CM | POA: Insufficient documentation

## 2015-02-13 DIAGNOSIS — F329 Major depressive disorder, single episode, unspecified: Secondary | ICD-10-CM | POA: Insufficient documentation

## 2015-02-13 DIAGNOSIS — Z79899 Other long term (current) drug therapy: Secondary | ICD-10-CM | POA: Diagnosis not present

## 2015-02-13 DIAGNOSIS — F419 Anxiety disorder, unspecified: Secondary | ICD-10-CM | POA: Insufficient documentation

## 2015-02-13 DIAGNOSIS — E079 Disorder of thyroid, unspecified: Secondary | ICD-10-CM | POA: Insufficient documentation

## 2015-02-13 DIAGNOSIS — M549 Dorsalgia, unspecified: Secondary | ICD-10-CM | POA: Diagnosis present

## 2015-02-13 DIAGNOSIS — M5432 Sciatica, left side: Secondary | ICD-10-CM | POA: Insufficient documentation

## 2015-02-13 NOTE — ED Notes (Signed)
Pt c/o lower back pain radiating down her left leg.

## 2015-02-13 NOTE — ED Provider Notes (Signed)
CSN: [id number]     Arrival date & time 02/13/15  2145 History  This chart was scribed for Shon Baton, MD by Octavia Heir, ED Scribe. This patient was seen in room MHTR2/MHTR2 and the patient's care was started at 11:53 PM.    Chief Complaint  Patient presents with  . Back Pain      The history is provided by the patient. No language interpreter was used.    HPI Comments: Julie Conley is a 39 y.o. female who presents to the Emergency Department complaining of constant, gradual worsening left sided buttocks pain. Pt has associated weakness and notes she felt as if her leg was going to give out earlier. Pt rates her current pain as 9/10. She notes it radiates from her left buttocks down to her left leg. Pt has taken OTC ibuprofen to alleviate the pain with no relief. Pt denies numbness and  bowel/bladder incontinence.   Past Medical History  Diagnosis Date  . Thyroid disease   . Anxiety   . Depression   . Fatigue   . Complications of gastric bypass surgery     anemia/altered ETOH metabolism   Past Surgical History  Procedure Laterality Date  . Cholecystectomy    . Cesarean section    . Abdominal hysterectomy    . Appendectomy    . Tonsillectomy    . Gastric bypass     Family History  Problem Relation Age of Onset  . Alcohol abuse Father   . Drug abuse Mother   . Drug abuse Brother    History  Substance Use Topics  . Smoking status: Never Smoker   . Smokeless tobacco: Not on file  . Alcohol Use: 16.2 oz/week    12 Cans of beer, 15 Shots of liquor per week     Comment: usually drinks occasionally but has drank daily for last 5 days   OB History    No data available     Review of Systems  Constitutional: Negative for fever.  Musculoskeletal: Positive for back pain.  Neurological: Negative for weakness and numbness.  All other systems reviewed and are negative.     Allergies  Sulfa antibiotics; Ciprocinonide; Sulfa antibiotics; Ciprofloxacin; and  Sulfamethoxazole  Home Medications   Prior to Admission medications   Medication Sig Start Date End Date Taking? Authorizing Provider  cyanocobalamin (,VITAMIN B-12,) 1000 MCG/ML injection Inject 1,000 mcg into the muscle. 11/20/11   Historical Provider, MD  DEXILANT 60 MG capsule  09/30/14   Historical Provider, MD  escitalopram (LEXAPRO) 20 MG tablet Take 1 tablet (20 mg total) by mouth daily. 11/30/14 11/30/15  Court Joy, PA-C  estrogens, conjugated, (PREMARIN) 0.625 MG tablet Take 0.625 mg by mouth. 11/20/11   Historical Provider, MD  hydrOXYzine (VISTARIL) 25 MG capsule  09/05/14   Historical Provider, MD  ibuprofen (ADVIL,MOTRIN) 600 MG tablet Take 1 tablet (600 mg total) by mouth every 6 (six) hours as needed. 02/14/15   Shon Baton, MD  levothyroxine (SYNTHROID, LEVOTHROID) 25 MCG tablet Take 25 mcg by mouth.    Historical Provider, MD  omeprazole (PRILOSEC) 20 MG capsule Take 20 mg by mouth.    Historical Provider, MD  ondansetron (ZOFRAN) 8 MG tablet  09/13/14   Historical Provider, MD  oxyCODONE-acetaminophen (PERCOCET/ROXICET) 5-325 MG per tablet Take 1-2 tablets by mouth every 6 (six) hours as needed for severe pain. 02/14/15   Shon Baton, MD  promethazine (PHENERGAN) 25 MG tablet  09/05/14   Historical  Provider, MD  sucralfate (CARAFATE) 1 G tablet  08/08/14   Historical Provider, MD  traZODone (DESYREL) 50 MG tablet Take 1 tablet (50 mg total) by mouth at bedtime as needed and may repeat dose one time if needed for sleep. 11/23/14   Court Joyharles E Kober, PA-C   Triage vitals: BP 132/67 mmHg  Pulse 95  Temp(Src) 98.3 F (36.8 C) (Oral)  Resp 20  Ht 5\' 4"  (1.626 m)  Wt 189 lb (85.73 kg)  BMI 32.43 kg/m2  SpO2 96% Physical Exam  Constitutional: She is oriented to person, place, and time. She appears well-developed and well-nourished. No distress.  HENT:  Head: Normocephalic and atraumatic.  Cardiovascular: Normal rate and regular rhythm.   Pulmonary/Chest: Effort  normal. No respiratory distress.  Musculoskeletal:  No tenderness to palpation over the midline lumbar spine, no significant paraspinous muscle tenderness, patient with positive left straight leg raise, negative cross leg raise  Neurological: She is alert and oriented to person, place, and time.  5 out of 5 strength bilateral lower extremities, normal gait, normal reflexes  Skin: Skin is warm and dry.  Psychiatric: She has a normal mood and affect.  Nursing note and vitals reviewed.   ED Course  Procedures  DIAGNOSTIC STUDIES: Oxygen Saturation is 96% on RA, adequate by my interpretation.  COORDINATION OF CARE:  11:54 PM Discussed treatment plan which includes ibuprofen and follow up with specialist with pt at bedside and pt agreed to plan.   Labs Review Labs Reviewed - No data to display  Imaging Review No results found.   EKG Interpretation None      MDM   Final diagnoses:  Sciatica, left    Patient presents with back pain. No known injury. No other signs or symptoms. Description of pain consistent with sciatica. She is neurologically intact. No other red flags back pain. Discussed with patient continuing anti-inflammatory cell home. Will be given a short course of stronger pain medication. If symptoms persist, she may need MRI. Refer to Dr. Pearletha ForgeHudnall.  After history, exam, and medical workup I feel the patient has been appropriately medically screened and is safe for discharge home. Pertinent diagnoses were discussed with the patient. Patient was given return precautions.  I personally performed the services described in this documentation, which was scribed in my presence. The recorded information has been reviewed and is accurate.   Shon Batonourtney F Shaquilla Kehres, MD 02/14/15 [phone]

## 2015-02-14 MED ORDER — OXYCODONE-ACETAMINOPHEN 5-325 MG PO TABS
1.0000 | ORAL_TABLET | Freq: Once | ORAL | Status: AC
  Administered 2015-02-14: 1 via ORAL
  Filled 2015-02-14: qty 1

## 2015-02-14 MED ORDER — IBUPROFEN 600 MG PO TABS: 600.0000 mg | ORAL_TABLET | Freq: Four times a day (QID) | ORAL | Status: DC | PRN

## 2015-02-14 MED ORDER — OXYCODONE-ACETAMINOPHEN 5-325 MG PO TABS: 1.0000 | ORAL_TABLET | Freq: Four times a day (QID) | ORAL | Status: DC | PRN

## 2015-02-14 NOTE — Discharge Instructions (Signed)
Sciatica Sciatica is pain, weakness, numbness, or tingling along the path of the sciatic nerve. The nerve starts in the lower back and runs down the back of each leg. The nerve controls the muscles in the lower leg and in the back of the knee, while also providing sensation to the back of the thigh, lower leg, and the sole of your foot. Sciatica is a symptom of another medical condition. For instance, nerve damage or certain conditions, such as a herniated disk or bone spur on the spine, pinch or put pressure on the sciatic nerve. This causes the pain, weakness, or other sensations normally associated with sciatica. Generally, sciatica only affects one side of the body. CAUSES   Herniated or slipped disc.  Degenerative disk disease.  A pain disorder involving the narrow muscle in the buttocks (piriformis syndrome).  Pelvic injury or fracture.  Pregnancy.  Tumor (rare). SYMPTOMS  Symptoms can vary from mild to very severe. The symptoms usually travel from the low back to the buttocks and down the back of the leg. Symptoms can include:  Mild tingling or dull aches in the lower back, leg, or hip.  Numbness in the back of the calf or sole of the foot.  Burning sensations in the lower back, leg, or hip.  Sharp pains in the lower back, leg, or hip.  Leg weakness.  Severe back pain inhibiting movement. These symptoms may get worse with coughing, sneezing, laughing, or prolonged sitting or standing. Also, being overweight may worsen symptoms. DIAGNOSIS  Your caregiver will perform a physical exam to look for common symptoms of sciatica. He or she may ask you to do certain movements or activities that would trigger sciatic nerve pain. Other tests may be performed to find the cause of the sciatica. These may include:  Blood tests.  X-rays.  Imaging tests, such as an MRI or CT scan. TREATMENT  Treatment is directed at the cause of the sciatic pain. Sometimes, treatment is not necessary  and the pain and discomfort goes away on its own. If treatment is needed, your caregiver may suggest:  Over-the-counter medicines to relieve pain.  Prescription medicines, such as anti-inflammatory medicine, muscle relaxants, or narcotics.  Applying heat or ice to the painful area.  Steroid injections to lessen pain, irritation, and inflammation around the nerve.  Reducing activity during periods of pain.  Exercising and stretching to strengthen your abdomen and improve flexibility of your spine. Your caregiver may suggest losing weight if the extra weight makes the back pain worse.  Physical therapy.  Surgery to eliminate what is pressing or pinching the nerve, such as a bone spur or part of a herniated disk. HOME CARE INSTRUCTIONS   Only take over-the-counter or prescription medicines for pain or discomfort as directed by your caregiver.  Apply ice to the affected area for 20 minutes, 3-4 times a day for the first 48-72 hours. Then try heat in the same way.  Exercise, stretch, or perform your usual activities if these do not aggravate your pain.  Attend physical therapy sessions as directed by your caregiver.  Keep all follow-up appointments as directed by your caregiver.  Do not wear high heels or shoes that do not provide proper support.  Check your mattress to see if it is too soft. A firm mattress may lessen your pain and discomfort. SEEK IMMEDIATE MEDICAL CARE IF:   You lose control of your bowel or bladder (incontinence).  You have increasing weakness in the lower back, pelvis, buttocks,   or legs.  You have redness or swelling of your back.  You have a burning sensation when you urinate.  You have pain that gets worse when you lie down or awakens you at night.  Your pain is worse than you have experienced in the past.  Your pain is lasting longer than 4 weeks.  You are suddenly losing weight without reason. MAKE SURE YOU:  Understand these  instructions.  Will watch your condition.  Will get help right away if you are not doing well or get worse. Document Released: 08/18/2001 Document Revised: 02/23/2012 Document Reviewed: 01/03/2012 Exit[employer] Patient Information 2015 [employer]. This information is not intended to replace advice given to you by your health care provider. Make sure you discuss any questions you have with your health care provider.  

## 2015-02-15 ENCOUNTER — Other Ambulatory Visit (HOSPITAL_COMMUNITY): Payer: 59

## 2015-04-08 ENCOUNTER — Emergency Department (HOSPITAL_BASED_OUTPATIENT_CLINIC_OR_DEPARTMENT_OTHER)
Admission: EM | Admit: 2015-04-08 | Discharge: 2015-04-08 | Disposition: A | Discharge status: Home / Self Care | Payer: 59 | Attending: Emergency Medicine | Admitting: Emergency Medicine

## 2015-04-08 ENCOUNTER — Emergency Department (HOSPITAL_BASED_OUTPATIENT_CLINIC_OR_DEPARTMENT_OTHER): Payer: 59

## 2015-04-08 ENCOUNTER — Encounter (HOSPITAL_BASED_OUTPATIENT_CLINIC_OR_DEPARTMENT_OTHER): Payer: Self-pay | Admitting: *Deleted

## 2015-04-08 DIAGNOSIS — Y9389 Activity, other specified: Secondary | ICD-10-CM | POA: Diagnosis not present

## 2015-04-08 DIAGNOSIS — S82842A Displaced bimalleolar fracture of left lower leg, initial encounter for closed fracture: Secondary | ICD-10-CM

## 2015-04-08 DIAGNOSIS — S92515A Nondisplaced fracture of proximal phalanx of left lesser toe(s), initial encounter for closed fracture: Secondary | ICD-10-CM | POA: Diagnosis not present

## 2015-04-08 DIAGNOSIS — F419 Anxiety disorder, unspecified: Secondary | ICD-10-CM | POA: Insufficient documentation

## 2015-04-08 DIAGNOSIS — Y9289 Other specified places as the place of occurrence of the external cause: Secondary | ICD-10-CM | POA: Insufficient documentation

## 2015-04-08 DIAGNOSIS — F329 Major depressive disorder, single episode, unspecified: Secondary | ICD-10-CM | POA: Diagnosis not present

## 2015-04-08 DIAGNOSIS — K219 Gastro-esophageal reflux disease without esophagitis: Secondary | ICD-10-CM | POA: Insufficient documentation

## 2015-04-08 DIAGNOSIS — Y998 Other external cause status: Secondary | ICD-10-CM | POA: Diagnosis not present

## 2015-04-08 DIAGNOSIS — Z9889 Other specified postprocedural states: Secondary | ICD-10-CM | POA: Diagnosis not present

## 2015-04-08 DIAGNOSIS — Z87891 Personal history of nicotine dependence: Secondary | ICD-10-CM | POA: Insufficient documentation

## 2015-04-08 DIAGNOSIS — S92912A Unspecified fracture of left toe(s), initial encounter for closed fracture: Secondary | ICD-10-CM

## 2015-04-08 DIAGNOSIS — W108XXA Fall (on) (from) other stairs and steps, initial encounter: Secondary | ICD-10-CM | POA: Diagnosis not present

## 2015-04-08 DIAGNOSIS — S99922A Unspecified injury of left foot, initial encounter: Secondary | ICD-10-CM | POA: Diagnosis present

## 2015-04-08 HISTORY — DX: Displaced bimalleolar fracture of left lower leg, initial encounter for closed fracture: S82.842A

## 2015-04-08 HISTORY — DX: Gastro-esophageal reflux disease without esophagitis: K21.9

## 2015-04-08 MED ORDER — OXYCODONE-ACETAMINOPHEN 5-325 MG PO TABS
1.0000 | ORAL_TABLET | Freq: Once | ORAL | Status: AC
  Administered 2015-04-08: 1 via ORAL
  Filled 2015-04-08: qty 1

## 2015-04-08 MED ORDER — HYDROCODONE-ACETAMINOPHEN 5-325 MG PO TABS: 1.0000 | ORAL_TABLET | ORAL | Status: DC | PRN

## 2015-04-08 MED ORDER — ONDANSETRON 8 MG PO TBDP
8.0000 mg | ORAL_TABLET | Freq: Once | ORAL | Status: AC
  Administered 2015-04-08: 8 mg via ORAL
  Filled 2015-04-08: qty 1

## 2015-04-08 MED ORDER — NAPROXEN 500 MG PO TABS: 500.0000 mg | ORAL_TABLET | Freq: Two times a day (BID) | ORAL | Status: DC

## 2015-04-08 NOTE — Discharge Instructions (Signed)
Keep leg elevated at home. Ice several times a day. Crutches for ambulation. Please call and follow-up with orthopedist as referred. Call tomorrow. Pain medications as prescribed.   Ankle Fracture A fracture is a break in a bone. The ankle joint is made up of three bones. These include the lower (distal)sections of your lower leg bones, called the tibia and fibula, along with a bone in your foot, called the talus. Depending on how bad the break is and if more than one ankle joint bone is broken, a cast or splint is used to protect and keep your injured bone from moving while it heals. Sometimes, surgery is required to help the fracture heal properly.  There are two general types of fractures:  Stable fracture. This includes a single fracture line through one bone, with no injury to ankle ligaments. A fracture of the talus that does not have any displacement (movement of the bone on either side of the fracture line) is also stable.  Unstable fracture. This includes more than one fracture line through one or more bones in the ankle joint. It also includes fractures that have displacement of the bone on either side of the fracture line. CAUSES  A direct blow to the ankle.   Quickly and severely twisting your ankle.  Trauma, such as a car accident or falling from a significant height. RISK FACTORS You may be at a higher risk of ankle fracture if:  You have certain medical conditions.  You are involved in high-impact sports.  You are involved in a high-impact car accident. SIGNS AND SYMPTOMS   Tender and swollen ankle.  Bruising around the injured ankle.  Pain on movement of the ankle.  Difficulty walking or putting weight on the ankle.  A cold foot below the site of the ankle injury. This can occur if the blood vessels passing through your injured ankle were also damaged.  Numbness in the foot below the site of the ankle injury. DIAGNOSIS  An ankle fracture is usually diagnosed  with a physical exam and X-rays. A CT scan may also be required for complex fractures. TREATMENT  Stable fractures are treated with a cast or splint and using crutches to avoid putting weight on your injured ankle. This is followed by an ankle strengthening program. Some patients require a special type of cast, depending on other medical problems they may have. Unstable fractures require surgery to ensure the bones heal properly. Your health care provider will tell you what type of fracture you have and the best treatment for your condition. HOME CARE INSTRUCTIONS   Review correct crutch use with your health care provider and use your crutches as directed. Safe use of crutches is extremely important. Misuse of crutches can cause you to fall or cause injury to nerves in your hands or armpits.  Do not put weight or pressure on the injured ankle until directed by your health care provider.  To lessen the swelling, keep the injured leg elevated while sitting or lying down.  Apply ice to the injured area:  Put ice in a plastic bag.  Place a towel between your cast and the bag.  Leave the ice on for 20 minutes, 2-3 times a day.  If you have a plaster or fiberglass cast:  Do not try to scratch the skin under the cast with any objects. This can increase your risk of skin infection.  Check the skin around the cast every day. You may put lotion on any red  or sore areas.  Keep your cast dry and clean.  If you have a plaster splint:  Wear the splint as directed.  You may loosen the elastic around the splint if your toes become numb, tingle, or turn cold or blue.  Do not put pressure on any part of your cast or splint; it may break. Rest your cast only on a pillow the first 24 hours until it is fully hardened.  Your cast or splint can be protected during bathing with a plastic bag sealed to your skin with medical tape. Do not lower the cast or splint into water.  Take medicines as directed by  your health care provider. Only take over-the-counter or prescription medicines for pain, discomfort, or fever as directed by your health care provider.  Do not drive a vehicle until your health care provider specifically tells you it is safe to do so.  If your health care provider has given you a follow-up appointment, it is very important to keep that appointment. Not keeping the appointment could result in a chronic or permanent injury, pain, and disability. If you have any problem keeping the appointment, call the facility for assistance. SEEK MEDICAL CARE IF: You develop increased swelling or discomfort. SEEK IMMEDIATE MEDICAL CARE IF:   Your cast gets damaged or breaks.  You have continued severe pain.  You develop new pain or swelling after the cast was put on.  Your skin or toenails below the injury turn blue or gray.  Your skin or toenails below the injury feel cold, numb, or have loss of sensitivity to touch.  There is a bad smell or pus draining from under the cast. MAKE SURE YOU:   Understand these instructions.  Will watch your condition.  Will get help right away if you are not doing well or get worse. Document Released: 08/21/2000 Document Revised: 08/29/2013 Document Reviewed: 03/23/2013 [facility] Patient Information 2015 [place], [place]. This information is not intended to replace advice given to you by your health care provider. Make sure you discuss any questions you have with your health care provider.

## 2015-04-08 NOTE — ED Notes (Signed)
Per [facility] - pt from home, c/o left foot/ankle pain x2 days ago - pain has gotten progressively worse. CMS intact, medial ankle contusion and dorsal foot contusion noted. Pt states she was walking on stairs, fell down approx 4 stairs, denies any other associating pain/injury. Pt admits to hx of sciatica pain and recently experienced n/v yesterday.

## 2015-04-08 NOTE — ED Notes (Signed)
Patient transported to X-ray 

## 2015-04-08 NOTE — ED Notes (Signed)
Pt ambulating independently w/ steady gait on d/c in no acute distress, A&Ox4. D/c instructions reviewed w/ pt and family - pt and family deny any further questions or concerns at present. Rx given x2  

## 2015-04-09 NOTE — ED Provider Notes (Signed)
CSN: [id number]     Arrival date & time 04/08/15  1941 History   First MD Initiated Contact with Patient 04/08/15 2008     Chief Complaint  Patient presents with  . Foot Injury  . Ankle Injury     (Consider location/radiation/quality/duration/timing/severity/associated sxs/prior Treatment) HPI Julie Conley is a 39 y.o. female with history of anxiety, depression, gastric bypass, presents to emergency department complaining of a fall and left foot injury. Patient states that she tripped and fell down 4 steps 2 days ago. Patient denies dizziness or lightheadedness, states that she tripped. She denies head injury. She denies any other injured areas. She states that initially she was able to walk on that leg, but today at the swelling and bruising has become worse and now she is unable to bear weight on the leg. Patient states she has been having leg elevated and home, has a place ice on it several times a day. She denies any numbness or weakness in her foot. She denies any pain in her knee or in her hips. Took over-the-counter ibuprofen with no pain relief.  Past Medical History  Diagnosis Date  . Anxiety   . Depression   . Fatigue   . Complications of gastric bypass surgery     anemia/altered ETOH metabolism  . GERD (gastroesophageal reflux disease)    Past Surgical History  Procedure Laterality Date  . Cholecystectomy    . Cesarean section    . Abdominal hysterectomy    . Appendectomy    . Tonsillectomy    . Gastric bypass    . Breast enhancement surgery     Family History  Problem Relation Age of Onset  . Alcohol abuse Father   . Drug abuse Mother   . Drug abuse Brother    History  Substance Use Topics  . Smoking status: Former Smoker    Quit date: 12/26/2014  . Smokeless tobacco: Not on file  . Alcohol Use: Yes     Comment: states "she hasn't drank in the past month"   OB History    No data available     Review of Systems  Constitutional: Negative for fever and  chills.  Musculoskeletal: Positive for joint swelling and arthralgias.  Skin: Positive for color change.  Neurological: Negative for weakness and numbness.      Allergies  Sulfa antibiotics; Ciprocinonide; Sulfa antibiotics; Ciprofloxacin; and Sulfamethoxazole  Home Medications   Prior to Admission medications   Medication Sig Start Date End Date Taking? Authorizing Provider  cyanocobalamin (,VITAMIN B-12,) 1000 MCG/ML injection Inject 1,000 mcg into the muscle. 11/20/11   Historical Provider, MD  DEXILANT 60 MG capsule  09/30/14   Historical Provider, MD  escitalopram (LEXAPRO) 20 MG tablet Take 1 tablet (20 mg total) by mouth daily. 11/30/14 11/30/15  Court Joy, PA-C  estrogens, conjugated, (PREMARIN) 0.625 MG tablet Take 0.625 mg by mouth. 11/20/11   Historical Provider, MD  HYDROcodone-acetaminophen (NORCO/VICODIN) 5-325 MG per tablet Take 1 tablet by mouth every 4 (four) hours as needed. 04/08/15   Latarsha Zani, PA-C  hydrOXYzine (VISTARIL) 25 MG capsule  09/05/14   Historical Provider, MD  ibuprofen (ADVIL,MOTRIN) 600 MG tablet Take 1 tablet (600 mg total) by mouth every 6 (six) hours as needed. 02/14/15   Shon Baton, MD  levothyroxine (SYNTHROID, LEVOTHROID) 25 MCG tablet Take 25 mcg by mouth.    Historical Provider, MD  naproxen (NAPROSYN) 500 MG tablet Take 1 tablet (500 mg total) by mouth 2 (two)  times daily. 04/08/15   Juancarlos Crescenzo, PA-C  omeprazole (PRILOSEC) 20 MG capsule Take 20 mg by mouth.    Historical Provider, MD  ondansetron (ZOFRAN) 8 MG tablet  09/13/14   Historical Provider, MD  oxyCODONE-acetaminophen (PERCOCET/ROXICET) 5-325 MG per tablet Take 1-2 tablets by mouth every 6 (six) hours as needed for severe pain. 02/14/15   Shon Baton, MD  promethazine (PHENERGAN) 25 MG tablet  09/05/14   Historical Provider, MD  sucralfate (CARAFATE) 1 G tablet  08/08/14   Historical Provider, MD  traZODone (DESYREL) 50 MG tablet Take 1 tablet (50 mg total) by mouth  at bedtime as needed and may repeat dose one time if needed for sleep. 11/23/14   Court Joy, PA-C   BP 133/89 mmHg  Pulse 106  Temp(Src) 98.8 F (37.1 C) (Oral)  Resp 18  Ht 5\' 4"  (1.626 m)  Wt 180 lb (81.647 kg)  BMI 30.88 kg/m2  SpO2 98% Physical Exam  Constitutional: She is oriented to person, place, and time. She appears well-developed and well-nourished. No distress.  Eyes: Conjunctivae are normal.  Neck: Neck supple.  Musculoskeletal:  Extensive bruising and swelling to the left lower leg from just distal to the anterior knee, along the anterior shin, lower bilateral malleoli of the ankle joint, and dorsal foot. Normal knee exam with full range of motion of the knee joint, negative anterior-posterior drawer signs. No joint tenderness. Tenderness along the anterior shin, tenderness over bilateral malleoli of the ankle. Pain with any range of motion of the ankle joint. Achilles tendon is nontender and is intact. Foot diffusely tender. Toes are normal for range of motion of all toes. Dorsal pedal pulses intact and equal bilaterally. Cap refill is 2 seconds distally and toes.  Neurological: She is alert and oriented to person, place, and time.  Skin: Skin is warm and dry.  Nursing note and vitals reviewed.   ED Course  Procedures (including critical care time) Labs Review Labs Reviewed - No data to display  Imaging Review Dg Tibia/fibula Left  04/08/2015   CLINICAL DATA:  Larey Seat down steps 2 days ago at home.  EXAM: LEFT TIBIA AND FIBULA - 2 VIEW  COMPARISON:  None.  FINDINGS: There is a nondisplaced medial malleolar fracture. There also is a nondisplaced tibial plafond fracture. Fibula appears intact.  IMPRESSION: Nondisplaced distal tibial fractures at the medial malleolus and at the tibial plafond.   Electronically Signed   By: Ellery Plunk M.D.   On: 04/08/2015 21:10   Dg Ankle Complete Left  04/08/2015   CLINICAL DATA:  Larey Seat down 4 steps at home 2 days ago, pain medially  laterally and posteriorly with swelling and bruising  EXAM: LEFT ANKLE COMPLETE - 3+ VIEW  COMPARISON:  None.  FINDINGS: Minimally displaced fracture of the medial and posterior malleolus. No fractures identified laterally. Moderate diffuse soft tissue swelling. Mortise appears to be intact.  IMPRESSION: Fractures, minimally displaced, of the medial and posterior malleoli.   Electronically Signed   By: Esperanza Heir M.D.   On: 04/08/2015 21:08   Dg Foot Complete Left  04/08/2015   CLINICAL DATA:  Larey Seat down 4 steps at home 2 days ago with anterior foot pain and bruising  EXAM: LEFT FOOT - COMPLETE 3+ VIEW  COMPARISON:  None.  FINDINGS: There is a nondisplaced fracture at the medial base of the second proximal phalanx which extends into the metatarsophalangeal joint. There is mild degenerative change of the first metatarsophalangeal joint. There  are no other focal osseous abnormalities with. Known fracture medial malleolus seen on this study is well as no fracture posterior malleolus.  IMPRESSION: Known fractures of the distal tibia.  Nondisplaced fracture at the base of the second proximal phalanx.   Electronically Signed   By: Esperanza Heir M.D.   On: 04/08/2015 21:10     EKG Interpretation None      MDM   Final diagnoses:  Ankle fracture, bimalleolar, closed, left, initial encounter  Toe fracture, left, closed, initial encounter    patient with left leg injury after a fall 2 days ago. We'll get x-rays. Her cassette ordered for pain.   Patient's x-ray is as discussed above. Patient placed in posterior splint, patient has crutches at home and refused our crutches. I instructed her not to bear any weight on the leg. Keep it elevated and ice for pain and swelling. Norco and a proximal and prescribed for pain. Will refer her to an orthopedic specialist, patient states she does not have one at this time. Patient otherwise neurovascularly intact. Stable for discharge home.  Filed Vitals:    04/08/15 1943 04/08/15 1947 04/08/15 2235  BP:  139/92 133/89  Pulse:  125 106  Temp:  98.8 F (37.1 C)   TempSrc:  Oral   Resp:  18 18  Height:   (1.626 m)   Weight:  180 lb (81.647 kg)   SpO2: 98% 100% 98%     Jaynie Crumble, PA-C 04/09/15 0115  Jerelyn Scott, MD 04/09/15 1517

## 2015-04-11 DIAGNOSIS — K219 Gastro-esophageal reflux disease without esophagitis: Secondary | ICD-10-CM | POA: Diagnosis not present

## 2015-04-11 DIAGNOSIS — Z791 Long term (current) use of non-steroidal anti-inflammatories (NSAID): Secondary | ICD-10-CM | POA: Diagnosis not present

## 2015-04-11 DIAGNOSIS — S82842A Displaced bimalleolar fracture of left lower leg, initial encounter for closed fracture: Secondary | ICD-10-CM | POA: Diagnosis not present

## 2015-04-11 DIAGNOSIS — Z79899 Other long term (current) drug therapy: Secondary | ICD-10-CM | POA: Diagnosis not present

## 2015-04-11 DIAGNOSIS — X58XXXA Exposure to other specified factors, initial encounter: Secondary | ICD-10-CM | POA: Diagnosis not present

## 2015-04-11 DIAGNOSIS — Z9884 Bariatric surgery status: Secondary | ICD-10-CM | POA: Diagnosis not present

## 2015-04-11 DIAGNOSIS — F329 Major depressive disorder, single episode, unspecified: Secondary | ICD-10-CM | POA: Diagnosis not present

## 2015-04-11 DIAGNOSIS — F419 Anxiety disorder, unspecified: Secondary | ICD-10-CM | POA: Diagnosis not present

## 2015-04-11 DIAGNOSIS — Z79891 Long term (current) use of opiate analgesic: Secondary | ICD-10-CM | POA: Diagnosis not present

## 2015-04-11 DIAGNOSIS — Z87891 Personal history of nicotine dependence: Secondary | ICD-10-CM | POA: Diagnosis not present

## 2015-04-11 NOTE — H&P (Signed)
PREOPERATIVE H&P  Chief Complaint: left ankle fracture  HPI: Julie Conley is a 39 y.o. female who presents for preoperative history and physical with a diagnosis of left ankle fracture. Symptoms are rated as moderate to severe, and have been worsening.  This is significantly impairing activities of daily living.  She has elected for surgical management.   Past Medical History  Diagnosis Date  . Anxiety   . Depression   . Fatigue   . Complications of gastric bypass surgery     anemia/altered ETOH metabolism  . GERD (gastroesophageal reflux disease)    Past Surgical History  Procedure Laterality Date  . Cholecystectomy    . Cesarean section    . Abdominal hysterectomy    . Appendectomy    . Tonsillectomy    . Gastric bypass    . Breast enhancement surgery     History   Social History  . Marital Status: Widowed    Spouse Name: N/A  . Number of Children: N/A  . Years of Education: N/A   Social History Main Topics  . Smoking status: Former Smoker    Quit date: 12/26/2014  . Smokeless tobacco: Not on file  . Alcohol Use: Yes     Comment: states "she hasn't drank in the past month"  . Drug Use: No  . Sexual Activity: Yes    Birth Control/ Protection: Surgical   Other Topics Concern  . Not on file   Social History Narrative   ** Merged History Encounter **       Family History  Problem Relation Age of Onset  . Alcohol abuse Father   . Drug abuse Mother   . Drug abuse Brother    Allergies  Allergen Reactions  . Sulfa Antibiotics Itching  . Ciprocinonide [Fluocinolone] Itching  . Sulfa Antibiotics   . Ciprofloxacin Rash  . Sulfamethoxazole Rash   Prior to Admission medications   Medication Sig Start Date End Date Taking? Authorizing Provider  cyanocobalamin (,VITAMIN B-12,) 1000 MCG/ML injection Inject 1,000 mcg into the muscle. 11/20/11   Historical Provider, MD  DEXILANT 60 MG capsule  09/30/14   Historical Provider, MD  escitalopram (LEXAPRO) 20 MG  tablet Take 1 tablet (20 mg total) by mouth daily. 11/30/14 11/30/15  Court Joy, PA-C  estrogens, conjugated, (PREMARIN) 0.625 MG tablet Take 0.625 mg by mouth. 11/20/11   Historical Provider, MD  HYDROcodone-acetaminophen (NORCO/VICODIN) 5-325 MG per tablet Take 1 tablet by mouth every 4 (four) hours as needed. 04/08/15   Tatyana Kirichenko, PA-C  hydrOXYzine (VISTARIL) 25 MG capsule  09/05/14   Historical Provider, MD  ibuprofen (ADVIL,MOTRIN) 600 MG tablet Take 1 tablet (600 mg total) by mouth every 6 (six) hours as needed. 02/14/15   Shon Baton, MD  levothyroxine (SYNTHROID, LEVOTHROID) 25 MCG tablet Take 25 mcg by mouth.    Historical Provider, MD  naproxen (NAPROSYN) 500 MG tablet Take 1 tablet (500 mg total) by mouth 2 (two) times daily. 04/08/15   Tatyana Kirichenko, PA-C  omeprazole (PRILOSEC) 20 MG capsule Take 20 mg by mouth.    Historical Provider, MD  ondansetron (ZOFRAN) 8 MG tablet  09/13/14   Historical Provider, MD  oxyCODONE-acetaminophen (PERCOCET/ROXICET) 5-325 MG per tablet Take 1-2 tablets by mouth every 6 (six) hours as needed for severe pain. 02/14/15   Shon Baton, MD  promethazine (PHENERGAN) 25 MG tablet  09/05/14   Historical Provider, MD  sucralfate (CARAFATE) 1 G tablet  08/08/14   Historical Provider, MD  traZODone (DESYREL) 50 MG tablet Take 1 tablet (50 mg total) by mouth at bedtime as needed and may repeat dose one time if needed for sleep. 11/23/14   Court Joy, PA-C     Positive ROS: All other systems have been reviewed and were otherwise negative with the exception of those mentioned in the HPI and as above.  Physical Exam: General: Alert, no acute distress Cardiovascular: No pedal edema Respiratory: No cyanosis, no use of accessory musculature GI: No organomegaly, abdomen is soft and non-tender Skin: No lesions in the area of chief complaint Neurologic: Sensation intact distally Psychiatric: Patient is competent for consent with normal mood and  affect Lymphatic: No axillary or cervical lymphadenopathy  MUSCULOSKELETAL: L ankle is swollen but no blistering or skin breakdown noted.  Sensation intact with 2+ distal pulses.   Assessment: left ankle fracture  Plan: Plan for Procedure(s): OPEN REDUCTION INTERNAL FIXATION (ORIF) LEFT BIMALLEOLAR AND SYNDESMOSIS ANKLE FRACTURE  The risks benefits and alternatives were discussed with the patient including but not limited to the risks of nonoperative treatment, versus surgical intervention including infection, bleeding, nerve injury,  blood clots, cardiopulmonary complications, morbidity, mortality, among others, and they were willing to proceed.   Lynann Bologna, PA-C  04/11/2015 10:40 AM

## 2015-04-12 ENCOUNTER — Encounter (HOSPITAL_BASED_OUTPATIENT_CLINIC_OR_DEPARTMENT_OTHER): Payer: Self-pay | Admitting: *Deleted

## 2015-04-12 DIAGNOSIS — M5430 Sciatica, unspecified side: Secondary | ICD-10-CM

## 2015-04-12 HISTORY — DX: Sciatica, unspecified side: M54.30

## 2015-04-15 ENCOUNTER — Encounter (HOSPITAL_BASED_OUTPATIENT_CLINIC_OR_DEPARTMENT_OTHER): Payer: Self-pay | Admitting: Anesthesiology

## 2015-04-15 ENCOUNTER — Encounter (HOSPITAL_BASED_OUTPATIENT_CLINIC_OR_DEPARTMENT_OTHER)
Admission: RE | Disposition: A | Discharge status: Home / Self Care | Payer: Self-pay | Source: Ambulatory Visit | Attending: Orthopedic Surgery

## 2015-04-15 ENCOUNTER — Ambulatory Visit (HOSPITAL_BASED_OUTPATIENT_CLINIC_OR_DEPARTMENT_OTHER): Payer: 59 | Admitting: Anesthesiology

## 2015-04-15 ENCOUNTER — Ambulatory Visit (HOSPITAL_BASED_OUTPATIENT_CLINIC_OR_DEPARTMENT_OTHER)
Admission: RE | Admit: 2015-04-15 | Discharge: 2015-04-15 | Disposition: A | Discharge status: Home / Self Care | Payer: 59 | Source: Ambulatory Visit | Attending: Orthopedic Surgery | Admitting: Orthopedic Surgery

## 2015-04-15 DIAGNOSIS — S82842A Displaced bimalleolar fracture of left lower leg, initial encounter for closed fracture: Secondary | ICD-10-CM | POA: Insufficient documentation

## 2015-04-15 DIAGNOSIS — K219 Gastro-esophageal reflux disease without esophagitis: Secondary | ICD-10-CM | POA: Insufficient documentation

## 2015-04-15 DIAGNOSIS — F329 Major depressive disorder, single episode, unspecified: Secondary | ICD-10-CM | POA: Insufficient documentation

## 2015-04-15 DIAGNOSIS — Z791 Long term (current) use of non-steroidal anti-inflammatories (NSAID): Secondary | ICD-10-CM | POA: Insufficient documentation

## 2015-04-15 DIAGNOSIS — Z87891 Personal history of nicotine dependence: Secondary | ICD-10-CM | POA: Insufficient documentation

## 2015-04-15 DIAGNOSIS — X58XXXA Exposure to other specified factors, initial encounter: Secondary | ICD-10-CM | POA: Insufficient documentation

## 2015-04-15 DIAGNOSIS — Z9884 Bariatric surgery status: Secondary | ICD-10-CM | POA: Insufficient documentation

## 2015-04-15 DIAGNOSIS — Z79899 Other long term (current) drug therapy: Secondary | ICD-10-CM | POA: Insufficient documentation

## 2015-04-15 DIAGNOSIS — Z79891 Long term (current) use of opiate analgesic: Secondary | ICD-10-CM | POA: Insufficient documentation

## 2015-04-15 DIAGNOSIS — F419 Anxiety disorder, unspecified: Secondary | ICD-10-CM | POA: Insufficient documentation

## 2015-04-15 HISTORY — DX: Adverse effect of unspecified anesthetic, initial encounter: T41.45XA

## 2015-04-15 HISTORY — DX: Displaced bimalleolar fracture of left lower leg, initial encounter for closed fracture: S82.842A

## 2015-04-15 HISTORY — DX: Migraine, unspecified, not intractable, without status migrainosus: G43.909

## 2015-04-15 HISTORY — DX: Sciatica, unspecified side: M54.30

## 2015-04-15 HISTORY — PX: ORIF ANKLE FRACTURE: SHX5408

## 2015-04-15 HISTORY — DX: Other complications of anesthesia, initial encounter: T88.59XA

## 2015-04-15 LAB — POCT HEMOGLOBIN-HEMACUE: HEMOGLOBIN: 9.4 g/dL — AB (ref 12.0–15.0)

## 2015-04-15 SURGERY — OPEN REDUCTION INTERNAL FIXATION (ORIF) ANKLE FRACTURE
Anesthesia: General | Site: Ankle | Laterality: Left

## 2015-04-15 MED ORDER — DEXAMETHASONE SODIUM PHOSPHATE 10 MG/ML IJ SOLN
INTRAMUSCULAR | Status: DC | PRN
  Administered 2015-04-15: 10 mg via INTRAVENOUS

## 2015-04-15 MED ORDER — LIDOCAINE HCL (CARDIAC) 20 MG/ML IV SOLN
INTRAVENOUS | Status: DC | PRN
  Administered 2015-04-15: 80 mg via INTRAVENOUS

## 2015-04-15 MED ORDER — FENTANYL CITRATE (PF) 100 MCG/2ML IJ SOLN
50.0000 ug | INTRAMUSCULAR | Status: DC | PRN
  Administered 2015-04-15: 100 ug via INTRAVENOUS

## 2015-04-15 MED ORDER — SODIUM CHLORIDE 0.9 % IR SOLN
Status: DC | PRN
  Administered 2015-04-15: 500 mL

## 2015-04-15 MED ORDER — ACETAMINOPHEN 500 MG PO TABS
1000.0000 mg | ORAL_TABLET | Freq: Once | ORAL | Status: AC
  Administered 2015-04-15: 1000 mg via ORAL

## 2015-04-15 MED ORDER — ONDANSETRON HCL 4 MG/2ML IJ SOLN
INTRAMUSCULAR | Status: DC | PRN
  Administered 2015-04-15: 4 mg via INTRAVENOUS

## 2015-04-15 MED ORDER — ONDANSETRON HCL 4 MG/2ML IJ SOLN: 4.0000 mg | Freq: Once | INTRAMUSCULAR | Status: DC | PRN

## 2015-04-15 MED ORDER — MIDAZOLAM HCL 2 MG/2ML IJ SOLN
INTRAMUSCULAR | Status: AC
  Filled 2015-04-15: qty 2

## 2015-04-15 MED ORDER — ACETAMINOPHEN 500 MG PO TABS
ORAL_TABLET | ORAL | Status: AC
  Filled 2015-04-15: qty 2

## 2015-04-15 MED ORDER — CEFAZOLIN SODIUM-DEXTROSE 2-3 GM-% IV SOLR
INTRAVENOUS | Status: AC
  Filled 2015-04-15: qty 50

## 2015-04-15 MED ORDER — BUPIVACAINE HCL (PF) 0.5 % IJ SOLN
INTRAMUSCULAR | Status: AC
  Filled 2015-04-15: qty 60

## 2015-04-15 MED ORDER — FENTANYL CITRATE (PF) 100 MCG/2ML IJ SOLN
INTRAMUSCULAR | Status: AC
  Filled 2015-04-15: qty 2

## 2015-04-15 MED ORDER — LIDOCAINE-EPINEPHRINE (PF) 1.5 %-1:200000 IJ SOLN
INTRAMUSCULAR | Status: DC | PRN
  Administered 2015-04-15: 30 mL via PERINEURAL

## 2015-04-15 MED ORDER — LACTATED RINGERS IV SOLN
INTRAVENOUS | Status: DC
  Administered 2015-04-15 (×2): via INTRAVENOUS

## 2015-04-15 MED ORDER — MIDAZOLAM HCL 2 MG/2ML IJ SOLN
1.0000 mg | INTRAMUSCULAR | Status: DC | PRN
  Administered 2015-04-15 (×2): 2 mg via INTRAVENOUS

## 2015-04-15 MED ORDER — BUPIVACAINE-EPINEPHRINE (PF) 0.5% -1:200000 IJ SOLN
INTRAMUSCULAR | Status: DC | PRN
  Administered 2015-04-15: 30 mL via PERINEURAL

## 2015-04-15 MED ORDER — SCOPOLAMINE 1 MG/3DAYS TD PT72: 1.0000 | MEDICATED_PATCH | Freq: Once | TRANSDERMAL | Status: DC | PRN

## 2015-04-15 MED ORDER — MEPERIDINE HCL 25 MG/ML IJ SOLN: 6.2500 mg | INTRAMUSCULAR | Status: DC | PRN

## 2015-04-15 MED ORDER — GLYCOPYRROLATE 0.2 MG/ML IJ SOLN: 0.2000 mg | Freq: Once | INTRAMUSCULAR | Status: DC | PRN

## 2015-04-15 MED ORDER — POTASSIUM CHLORIDE IN NACL 20-0.45 MEQ/L-% IV SOLN: INTRAVENOUS | Status: DC

## 2015-04-15 MED ORDER — CHLORHEXIDINE GLUCONATE 4 % EX LIQD
60.0000 mL | Freq: Once | CUTANEOUS | Status: DC
Start: 2015-04-15 — End: 2015-04-15

## 2015-04-15 MED ORDER — FENTANYL CITRATE (PF) 100 MCG/2ML IJ SOLN
INTRAMUSCULAR | Status: AC
  Filled 2015-04-15: qty 6

## 2015-04-15 MED ORDER — ONDANSETRON HCL 4 MG PO TABS: 4.0000 mg | ORAL_TABLET | Freq: Three times a day (TID) | ORAL | Status: DC | PRN

## 2015-04-15 MED ORDER — HYDROMORPHONE HCL 1 MG/ML IJ SOLN: 0.2500 mg | INTRAMUSCULAR | Status: DC | PRN

## 2015-04-15 MED ORDER — PROPOFOL 10 MG/ML IV BOLUS
INTRAVENOUS | Status: DC | PRN
  Administered 2015-04-15: 150 mg via INTRAVENOUS

## 2015-04-15 MED ORDER — CEFAZOLIN SODIUM-DEXTROSE 2-3 GM-% IV SOLR
2.0000 g | INTRAVENOUS | Status: AC
  Administered 2015-04-15: 2 g via INTRAVENOUS

## 2015-04-15 MED ORDER — DOCUSATE SODIUM 100 MG PO CAPS: 100.0000 mg | ORAL_CAPSULE | Freq: Two times a day (BID) | ORAL | Status: DC

## 2015-04-15 MED ORDER — OXYCODONE-ACETAMINOPHEN 5-325 MG PO TABS: 1.0000 | ORAL_TABLET | ORAL | Status: DC | PRN

## 2015-04-15 SURGICAL SUPPLY — 67 items
BANDAGE ELASTIC 4 VELCRO ST LF (GAUZE/BANDAGES/DRESSINGS) ×3 IMPLANT
BANDAGE ELASTIC 6 VELCRO ST LF (GAUZE/BANDAGES/DRESSINGS) ×3 IMPLANT
BANDAGE ESMARK 6X9 LF (GAUZE/BANDAGES/DRESSINGS) ×1 IMPLANT
BIT DRILL CANN 2.7 (BIT) ×1
BIT DRILL CANN 2.7MM (BIT) ×1
BIT DRILL SRG 2.7XCANN AO CPLG (BIT) ×1 IMPLANT
BIT DRL SRG 2.7XCANN AO CPLNG (BIT) ×1
BLADE SURG 15 STRL LF DISP TIS (BLADE) ×2 IMPLANT
BLADE SURG 15 STRL SS (BLADE) ×4
BNDG CMPR 9X6 STRL LF SNTH (GAUZE/BANDAGES/DRESSINGS) ×1
BNDG COHESIVE 4X5 TAN STRL (GAUZE/BANDAGES/DRESSINGS) ×3 IMPLANT
BNDG ESMARK 6X9 LF (GAUZE/BANDAGES/DRESSINGS) ×3
CHLORAPREP W/TINT 26ML (MISCELLANEOUS) ×3 IMPLANT
CLOSURE STERI-STRIP 1/2X4 (GAUZE/BANDAGES/DRESSINGS) ×1
CLSR STERI-STRIP ANTIMIC 1/2X4 (GAUZE/BANDAGES/DRESSINGS) ×2 IMPLANT
COVER BACK TABLE 60X90IN (DRAPES) ×3 IMPLANT
CUFF TOURNIQUET SINGLE 34IN LL (TOURNIQUET CUFF) ×3 IMPLANT
DRAPE EXTREMITY T 121X128X90 (DRAPE) ×3 IMPLANT
DRAPE OEC MINIVIEW 54X84 (DRAPES) ×3 IMPLANT
DRAPE U 20/CS (DRAPES) ×3 IMPLANT
DRAPE U-SHAPE 47X51 STRL (DRAPES) ×3 IMPLANT
DRSG EMULSION OIL 3X3 NADH (GAUZE/BANDAGES/DRESSINGS) ×3 IMPLANT
DRSG PAD ABDOMINAL 8X10 ST (GAUZE/BANDAGES/DRESSINGS) ×3 IMPLANT
ELECT REM PT RETURN 9FT ADLT (ELECTROSURGICAL) ×3
ELECTRODE REM PT RTRN 9FT ADLT (ELECTROSURGICAL) ×1 IMPLANT
GAUZE SPONGE 4X4 12PLY STRL (GAUZE/BANDAGES/DRESSINGS) ×3 IMPLANT
GLOVE BIO SURGEON STRL SZ7 (GLOVE) ×3 IMPLANT
GLOVE BIO SURGEON STRL SZ7.5 (GLOVE) ×3 IMPLANT
GLOVE BIOGEL PI IND STRL 7.0 (GLOVE) ×1 IMPLANT
GLOVE BIOGEL PI IND STRL 8 (GLOVE) ×1 IMPLANT
GLOVE BIOGEL PI INDICATOR 7.0 (GLOVE) ×2
GLOVE BIOGEL PI INDICATOR 8 (GLOVE) ×2
GOWN STRL REUS W/ TWL LRG LVL3 (GOWN DISPOSABLE) ×3 IMPLANT
GOWN STRL REUS W/TWL LRG LVL3 (GOWN DISPOSABLE) ×6
K-WIRE ORTHOPEDIC 1.4X150L (WIRE) ×6
KWIRE ORTHOPEDIC 1.4X150L (WIRE) ×2 IMPLANT
NEEDLE HYPO 22GX1.5 SAFETY (NEEDLE) IMPLANT
NS IRRIG 1000ML POUR BTL (IV SOLUTION) ×3 IMPLANT
PACK BASIN DAY SURGERY FS (CUSTOM PROCEDURE TRAY) ×3 IMPLANT
PAD CAST 4YDX4 CTTN HI CHSV (CAST SUPPLIES) ×1 IMPLANT
PADDING CAST ABS 4INX4YD NS (CAST SUPPLIES) ×4
PADDING CAST ABS COTTON 4X4 ST (CAST SUPPLIES) ×2 IMPLANT
PADDING CAST COTTON 4X4 STRL (CAST SUPPLIES) ×2
PADDING CAST COTTON 6X4 STRL (CAST SUPPLIES) ×3 IMPLANT
PENCIL BUTTON HOLSTER BLD 10FT (ELECTRODE) ×3 IMPLANT
PLATE TUBULAR 1/3 2H (Plate) ×3 IMPLANT
PLATE TUBULAR 1/3 2H 25MM (Plate) ×1 IMPLANT
REPAIR TROPE KNTLS SS SYNDESMO (Orthopedic Implant) ×6 IMPLANT
SCREW CANN 44X15X4XSLF DRL (Screw) ×2 IMPLANT
SCREW CANNULATED 4.0X44MM (Screw) ×4 IMPLANT
SLEEVE SCD COMPRESS KNEE MED (MISCELLANEOUS) ×3 IMPLANT
SPLINT FAST PLASTER 5X30 (CAST SUPPLIES) ×40
SPLINT PLASTER CAST FAST 5X30 (CAST SUPPLIES) ×20 IMPLANT
SPONGE LAP 4X18 X RAY DECT (DISPOSABLE) ×3 IMPLANT
SUT ETHILON 3 0 PS 1 (SUTURE) ×3 IMPLANT
SUT MNCRL AB 4-0 PS2 18 (SUTURE) IMPLANT
SUT MON AB 2-0 CT1 36 (SUTURE) IMPLANT
SUT VIC AB 0 SH 27 (SUTURE) ×3 IMPLANT
SUT VIC AB 2-0 SH 27 (SUTURE) ×2
SUT VIC AB 2-0 SH 27XBRD (SUTURE) ×1 IMPLANT
SYR BULB 3OZ (MISCELLANEOUS) ×3 IMPLANT
SYR CONTROL 10ML LL (SYRINGE) IMPLANT
TOWEL OR 17X24 6PK STRL BLUE (TOWEL DISPOSABLE) ×6 IMPLANT
TOWEL OR NON WOVEN STRL DISP B (DISPOSABLE) ×3 IMPLANT
TUBE CONNECTING 20'X1/4 (TUBING) ×1
TUBE CONNECTING 20X1/4 (TUBING) ×2 IMPLANT
UNDERPAD 30X30 (UNDERPADS AND DIAPERS) ×3 IMPLANT

## 2015-04-15 NOTE — Discharge Instructions (Signed)
Keep splint clean and dry till follow up  Non-weight bearing in the L leg  . Post Anesthesia Home Care Instructions  Activity: Get plenty of rest for the remainder of the day. A responsible adult should stay with you for 24 hours following the procedure.  For the next 24 hours, DO NOT: -Drive a car -Advertising copywriter -Drink alcoholic beverages -Take any medication unless instructed by your physician -Make any legal decisions or sign important papers.  Meals: Start with liquid foods such as gelatin or soup. Progress to regular foods as tolerated. Avoid greasy, spicy, heavy foods. If nausea and/or vomiting occur, drink only clear liquids until the nausea and/or vomiting subsides. Call your physician if vomiting continues.  Special Instructions/Symptoms: Your throat may feel dry or sore from the anesthesia or the breathing tube placed in your throat during surgery. If this causes discomfort, gargle with warm salt water. The discomfort should disappear within 24 hours.  If you had a scopolamine patch placed behind your ear for the management of post- operative nausea and/or vomiting:  1. The medication in the patch is effective for 72 hours, after which it should be removed.  Wrap patch in a tissue and discard in the trash. Wash hands thoroughly with soap and water. 2. You may remove the patch earlier than 72 hours if you experience unpleasant side effects which may include dry mouth, dizziness or visual disturbances. 3. Avoid touching the patch. Wash your hands with soap and water after contact with the patch.     Regional Anesthesia Blocks  1. Numbness or the inability to move the "blocked" extremity may last from 3-48 hours after placement. The length of time depends on the medication injected and your individual response to the medication. If the numbness is not going away after 48 hours, call your surgeon.  2. The extremity that is blocked will need to be protected until the  numbness is gone and the  Strength has returned. Because you cannot feel it, you will need to take extra care to avoid injury. Because it may be weak, you may have difficulty moving it or using it. You may not know what position it is in without looking at it while the block is in effect.  3. For blocks in the legs and feet, returning to weight bearing and walking needs to be done carefully. You will need to wait until the numbness is entirely gone and the strength has returned. You should be able to move your leg and foot normally before you try and bear weight or walk. You will need someone to be with you when you first try to ensure you do not fall and possibly risk injury.  4. Bruising and tenderness at the needle site are common side effects and will resolve in a few days.  5. Persistent numbness or new problems with movement should be communicated to the surgeon or the [facility] [phone] [facility] [phone]).

## 2015-04-15 NOTE — Anesthesia Procedure Notes (Addendum)
Anesthesia Regional Block:  Popliteal block  Pre-Anesthetic Checklist: ,, timeout performed, Correct Patient, Correct Site, Correct Laterality, Correct Procedure, Correct Position, site marked, Risks and benefits discussed,  Surgical consent,  Pre-op evaluation,  At surgeon's request and post-op pain management  Laterality: Left  Prep: chloraprep       Needles:  Injection technique: Single-shot  Needle Type: Echogenic Stimulator Needle     Needle Length: 10cm 10 cm Needle Gauge: 21 and 21 G    Additional Needles:  Procedures: ultrasound guided (picture in chart) and nerve stimulator Popliteal block  Nerve Stimulator or Paresthesia:  Response: 0.4 mA,   Additional Responses:   Narrative:  Start time: 04/15/2015 12:46 PM End time: 04/15/2015 12:40 PM Injection made incrementally with aspirations every 5 mL.  Performed by: Personally  Anesthesiologist: Arta Bruce  Additional Notes: Monitors applied. Patient sedated. Sterile prep and drape,hand hygiene and sterile gloves were used. Relevant anatomy identified.Needle position confirmed.Local anesthetic injected incrementally after negative aspiration. Local anesthetic spread visualized around nerve(s). Vascular puncture avoided. No complications. Image printed for medical record.The patient tolerated the procedure well.  Additional Saphenous nerve block performed. 15cc Local Anesthetic mixture placed under ultrasonic guidance along the medio-inferior border of the Sartorious muscle 6 inches above the knee.  No Problems encountered.  Arta Bruce MD    Procedure Name: LMA Insertion Date/Time: 04/15/2015 2:21 PM Performed by: Burna Cash Pre-anesthesia Checklist: Patient identified, Emergency Drugs available, Suction available and Patient being monitored Patient Re-evaluated:Patient Re-evaluated prior to inductionOxygen Delivery Method: Circle System Utilized Preoxygenation: Pre-oxygenation with 100% oxygen Intubation  Type: IV induction Ventilation: Mask ventilation without difficulty LMA: LMA inserted LMA Size: 4.0 Number of attempts: 1 Airway Equipment and Method: Bite block Placement Confirmation: positive ETCO2 Tube secured with: Tape Dental Injury: Teeth and Oropharynx as per pre-operative assessment

## 2015-04-15 NOTE — Progress Notes (Signed)
Assisted Dr. Ossey with left, ultrasound guided, popliteal/saphenous block. Side rails up, monitors on throughout procedure. See vital signs in flow sheet. Tolerated Procedure well. 

## 2015-04-15 NOTE — Anesthesia Preprocedure Evaluation (Signed)
Anesthesia Evaluation  Patient identified by MRN, date of birth, ID band Patient awake    Reviewed: Allergy & Precautions, NPO status , Patient's Chart, lab work & pertinent test results  Airway Mallampati: I  TM Distance: >3 FB Neck ROM: Full    Dental   Pulmonary former smoker,    Pulmonary exam normal       Cardiovascular Normal cardiovascular exam    Neuro/Psych    GI/Hepatic GERD-  Medicated and Controlled,  Endo/Other    Renal/GU      Musculoskeletal   Abdominal   Peds  Hematology   Anesthesia Other Findings   Reproductive/Obstetrics                             Anesthesia Physical Anesthesia Plan  ASA: II  Anesthesia Plan: General   Post-op Pain Management: GA combined w/ Regional for post-op pain   Induction: Intravenous  Airway Management Planned: LMA  Additional Equipment:   Intra-op Plan:   Post-operative Plan: Extubation in OR  Informed Consent: I have reviewed the patients History and Physical, chart, labs and discussed the procedure including the risks, benefits and alternatives for the proposed anesthesia with the patient or authorized representative who has indicated his/her understanding and acceptance.     Plan Discussed with: CRNA and Surgeon  Anesthesia Plan Comments:         Anesthesia Quick Evaluation

## 2015-04-15 NOTE — Transfer of Care (Signed)
Immediate Anesthesia Transfer of Care Note  Patient: Julie Conley  Procedure(s) Performed: Procedure(s): OPEN REDUCTION INTERNAL FIXATION (ORIF) LEFT BIMALLEOLAR AND SYNDESMOSIS ANKLE FRACTURE (Left)  Patient Location: PACU  Anesthesia Type:General and GA combined with regional for post-op pain  Level of Consciousness: awake and alert   Airway & Oxygen Therapy: Patient Spontanous Breathing and Patient connected to face mask oxygen  Post-op Assessment: Report given to RN and Post -op Vital signs reviewed and stable  Post vital signs: Reviewed and stable  Last Vitals:  Filed Vitals:   04/15/15 1240  BP: 117/69  Pulse: 92  Temp:   Resp: 12    Complications: No apparent anesthesia complications

## 2015-04-15 NOTE — Op Note (Signed)
04/15/2015  3:34 PM  PATIENT:  Julie Conley    PRE-OPERATIVE DIAGNOSIS:  left ankle fracture  POST-OPERATIVE DIAGNOSIS:  Same  PROCEDURE:  OPEN REDUCTION INTERNAL FIXATION (ORIF) LEFT BIMALLEOLAR AND SYNDESMOSIS ANKLE FRACTURE  SURGEON:  Emberlyn Burlison, Jewel Baize, MD  ASSISTANT: Janalee Dane, PA-C, She was present and scrubbed throughout the case, critical for completion in a timely fashion, and for retraction, instrumentation, and closure.   ANESTHESIA:   gen  PREOPERATIVE INDICATIONS:  [name] is a  39 y.o. female with a diagnosis of left ankle fracture who failed conservative measures and elected for surgical management.    The risks benefits and alternatives were discussed with the patient preoperatively including but not limited to the risks of infection, bleeding, nerve injury, cardiopulmonary complications, the need for revision surgery, among others, and the patient was willing to proceed.  OPERATIVE IMPLANTS: Tight rope and medial cannulated screws  OPERATIVE FINDINGS: Unstable ankle fracture. Stable syndesmosis post op  BLOOD LOSS: min  COMPLICATIONS: nonne  TOURNIQUET TIME:  OPERATIVE PROCEDURE:  Patient was identified in the preoperative holding area and site was marked by me He was transported to the operating theater and placed on the table in supine position taking care to pad all bony prominences. After a preincinduction time out anesthesia was induced. The left lower extremity was prepped and draped in normal sterile fashion and a pre-incision timeout was performed. Julie Conley received ancef for preoperative antibiotics.   I then turned my attention medially where I created a 4 cm incision and dissected sharply down to the medial Mal fracture taking care to protect the saphenous vein. I debrided the fracture and reduced and held in place with a tenaculum. I then drilled and placed 2 partially threaded 45 mm cannulated screws one anterior and one  posterior across the fracture.  I then stressed the syndesmosis and for syndesmotic fixation I performed a reduction maneuver with a clamp and placed 2 tight ropes with a 2 hole plate. The lower tight rope sat in some medial comminution but was stable.   I assesed the posterior mal piece and it was small enough to not require fixation as it involved less than 20% of the articular surface  The wound was then thoroughly irrigated and closed using a 0 Vicryl and absorbable Monocryl sutures. He was placed in a short leg splint.   POST OPERATIVE PLAN: Non-weightbearing. DVT prophylaxis will consist of ASA  This note was generated using a template and dragon dictation system. In light of that, I have reviewed the note and all aspects of it are applicable to this case. Any dictation errors are due to the computerized dictation system.

## 2015-04-15 NOTE — Interval H&P Note (Signed)
History and Physical Interval Note:  04/15/2015 11:56 AM  Julie Conley  has presented today for surgery, with the diagnosis of left ankle fracture  The various methods of treatment have been discussed with the patient and family. After consideration of risks, benefits and other options for treatment, the patient has consented to  Procedure(s): OPEN REDUCTION INTERNAL FIXATION (ORIF) LEFT BIMALLEOLAR AND SYNDESMOSIS ANKLE FRACTURE (Left) as a surgical intervention .  The patient's history has been reviewed, patient examined, no change in status, stable for surgery.  I have reviewed the patient's chart and labs.  Questions were answered to the patient's satisfaction.     Chezney Huether D

## 2015-04-15 NOTE — Anesthesia Postprocedure Evaluation (Signed)
Anesthesia Post Note  Patient: Julie Conley  Procedure(s) Performed: Procedure(s) (LRB): OPEN REDUCTION INTERNAL FIXATION (ORIF) LEFT BIMALLEOLAR AND SYNDESMOSIS ANKLE FRACTURE (Left)  Anesthesia type: general  Patient location: PACU  Post pain: Pain level controlled  Post assessment: Patient's Cardiovascular Status Stable  Last Vitals:  Filed Vitals:   04/15/15 1554  BP: 110/54  Pulse: 91  Temp:   Resp: 13    Post vital signs: Reviewed and stable  Level of consciousness: sedated  Complications: No apparent anesthesia complications

## 2015-04-17 ENCOUNTER — Encounter (HOSPITAL_BASED_OUTPATIENT_CLINIC_OR_DEPARTMENT_OTHER): Payer: Self-pay | Admitting: Orthopedic Surgery

## 2016-01-19 ENCOUNTER — Inpatient Hospital Stay (HOSPITAL_COMMUNITY)
Admission: EM | Admit: 2016-01-19 | Discharge: 2016-01-22 | DRG: 392 | Disposition: A | Discharge status: Home / Self Care | Payer: Self-pay | Attending: Internal Medicine | Admitting: Internal Medicine

## 2016-01-19 DIAGNOSIS — R112 Nausea with vomiting, unspecified: Secondary | ICD-10-CM | POA: Diagnosis present

## 2016-01-19 DIAGNOSIS — Z7251 High risk heterosexual behavior: Secondary | ICD-10-CM

## 2016-01-19 DIAGNOSIS — Z87891 Personal history of nicotine dependence: Secondary | ICD-10-CM

## 2016-01-19 DIAGNOSIS — Z882 Allergy status to sulfonamides status: Secondary | ICD-10-CM

## 2016-01-19 DIAGNOSIS — D72829 Elevated white blood cell count, unspecified: Secondary | ICD-10-CM | POA: Diagnosis present

## 2016-01-19 DIAGNOSIS — R9431 Abnormal electrocardiogram [ECG] [EKG]: Secondary | ICD-10-CM | POA: Diagnosis present

## 2016-01-19 DIAGNOSIS — R748 Abnormal levels of other serum enzymes: Secondary | ICD-10-CM | POA: Diagnosis present

## 2016-01-19 DIAGNOSIS — K529 Noninfective gastroenteritis and colitis, unspecified: Principal | ICD-10-CM | POA: Diagnosis present

## 2016-01-19 DIAGNOSIS — E876 Hypokalemia: Secondary | ICD-10-CM | POA: Diagnosis present

## 2016-01-19 DIAGNOSIS — R739 Hyperglycemia, unspecified: Secondary | ICD-10-CM | POA: Diagnosis present

## 2016-01-19 DIAGNOSIS — E871 Hypo-osmolality and hyponatremia: Secondary | ICD-10-CM | POA: Diagnosis present

## 2016-01-19 DIAGNOSIS — Z885 Allergy status to narcotic agent status: Secondary | ICD-10-CM

## 2016-01-19 DIAGNOSIS — E860 Dehydration: Secondary | ICD-10-CM | POA: Diagnosis present

## 2016-01-19 DIAGNOSIS — Z90710 Acquired absence of both cervix and uterus: Secondary | ICD-10-CM

## 2016-01-19 DIAGNOSIS — Z9049 Acquired absence of other specified parts of digestive tract: Secondary | ICD-10-CM

## 2016-01-19 DIAGNOSIS — K219 Gastro-esophageal reflux disease without esophagitis: Secondary | ICD-10-CM | POA: Diagnosis present

## 2016-01-19 DIAGNOSIS — Z881 Allergy status to other antibiotic agents status: Secondary | ICD-10-CM

## 2016-01-19 DIAGNOSIS — R1030 Lower abdominal pain, unspecified: Secondary | ICD-10-CM | POA: Diagnosis present

## 2016-01-19 DIAGNOSIS — Z9884 Bariatric surgery status: Secondary | ICD-10-CM

## 2016-01-19 DIAGNOSIS — Z9109 Other allergy status, other than to drugs and biological substances: Secondary | ICD-10-CM

## 2016-01-20 ENCOUNTER — Encounter (HOSPITAL_COMMUNITY): Payer: Self-pay | Admitting: Emergency Medicine

## 2016-01-20 ENCOUNTER — Emergency Department (HOSPITAL_COMMUNITY): Payer: Self-pay

## 2016-01-20 DIAGNOSIS — Z7251 High risk heterosexual behavior: Secondary | ICD-10-CM

## 2016-01-20 DIAGNOSIS — R112 Nausea with vomiting, unspecified: Secondary | ICD-10-CM | POA: Diagnosis present

## 2016-01-20 DIAGNOSIS — R739 Hyperglycemia, unspecified: Secondary | ICD-10-CM | POA: Diagnosis present

## 2016-01-20 DIAGNOSIS — E876 Hypokalemia: Secondary | ICD-10-CM | POA: Diagnosis present

## 2016-01-20 DIAGNOSIS — R748 Abnormal levels of other serum enzymes: Secondary | ICD-10-CM | POA: Diagnosis present

## 2016-01-20 DIAGNOSIS — E871 Hypo-osmolality and hyponatremia: Secondary | ICD-10-CM | POA: Diagnosis present

## 2016-01-20 LAB — COMPREHENSIVE METABOLIC PANEL
ALBUMIN: 4.2 g/dL (ref 3.5–5.0)
ALK PHOS: 71 U/L (ref 38–126)
ALT: 33 U/L (ref 14–54)
ANION GAP: 12 (ref 5–15)
AST: 45 U/L — ABNORMAL HIGH (ref 15–41)
BUN: 19 mg/dL (ref 6–20)
CALCIUM: 9.5 mg/dL (ref 8.9–10.3)
CHLORIDE: 94 mmol/L — AB (ref 101–111)
CO2: 14 mmol/L — AB (ref 22–32)
Creatinine, Ser: 0.96 mg/dL (ref 0.44–1.00)
GFR calc non Af Amer: >60 mL/min (ref >60)
Glucose, Bld: 161 mg/dL — ABNORMAL HIGH (ref 65–99)
SODIUM: 120 mmol/L — AB (ref 135–145)
Total Bilirubin: 0.5 mg/dL (ref 0.3–1.2)
Total Protein: 7.2 g/dL (ref 6.5–8.1)

## 2016-01-20 LAB — RAPID URINE DRUG SCREEN, HOSP PERFORMED
AMPHETAMINES: NOT DETECTED
Barbiturates: NOT DETECTED
Benzodiazepines: NOT DETECTED
Cocaine: NOT DETECTED
Opiates: NOT DETECTED
Tetrahydrocannabinol: NOT DETECTED

## 2016-01-20 LAB — GC/CHLAMYDIA PROBE AMP (~~LOC~~) NOT AT ARMC
Chlamydia: NEGATIVE
Neisseria Gonorrhea: NEGATIVE

## 2016-01-20 LAB — BASIC METABOLIC PANEL
ANION GAP: 8 (ref 5–15)
ANION GAP: 7 (ref 5–15)
ANION GAP: 6 (ref 5–15)
BUN: 14 mg/dL (ref 6–20)
BUN: 12 mg/dL (ref 6–20)
BUN: 12 mg/dL (ref 6–20)
CALCIUM: 7.7 mg/dL — AB (ref 8.9–10.3)
CALCIUM: 7.9 mg/dL — AB (ref 8.9–10.3)
CALCIUM: 7.8 mg/dL — AB (ref 8.9–10.3)
CO2: 16 mmol/L — ABNORMAL LOW (ref 22–32)
CO2: 17 mmol/L — ABNORMAL LOW (ref 22–32)
CO2: 17 mmol/L — ABNORMAL LOW (ref 22–32)
Chloride: 101 mmol/L (ref 101–111)
Chloride: 103 mmol/L (ref 101–111)
Chloride: 106 mmol/L (ref 101–111)
Creatinine, Ser: 0.85 mg/dL (ref 0.44–1.00)
Creatinine, Ser: 0.8 mg/dL (ref 0.44–1.00)
Creatinine, Ser: 0.93 mg/dL (ref 0.44–1.00)
GFR calc Af Amer: >60 mL/min (ref >60)
GFR calc Af Amer: >60 mL/min (ref >60)
GLUCOSE: 135 mg/dL — AB (ref 65–99)
GLUCOSE: 107 mg/dL — AB (ref 65–99)
GLUCOSE: 111 mg/dL — AB (ref 65–99)
Potassium: 2.4 mmol/L — CL (ref 3.5–5.1)
Potassium: 2.9 mmol/L — ABNORMAL LOW (ref 3.5–5.1)
Potassium: 2.8 mmol/L — ABNORMAL LOW (ref 3.5–5.1)
SODIUM: 125 mmol/L — AB (ref 135–145)
Sodium: 127 mmol/L — ABNORMAL LOW (ref 135–145)
Sodium: 129 mmol/L — ABNORMAL LOW (ref 135–145)

## 2016-01-20 LAB — URINALYSIS, ROUTINE W REFLEX MICROSCOPIC
BILIRUBIN URINE: NEGATIVE
Glucose, UA: NEGATIVE mg/dL
HGB URINE DIPSTICK: NEGATIVE
KETONES UR: NEGATIVE mg/dL
Leukocytes, UA: NEGATIVE
Nitrite: NEGATIVE
PROTEIN: NEGATIVE mg/dL
Specific Gravity, Urine: 1.008 (ref 1.005–1.030)
pH: 6.5 (ref 5.0–8.0)

## 2016-01-20 LAB — TROPONIN I: Troponin I: <0.03 ng/mL (ref <0.031)

## 2016-01-20 LAB — WET PREP, GENITAL
CLUE CELLS WET PREP: NONE SEEN
SPERM: NONE SEEN
Trich, Wet Prep: NONE SEEN
WBC WET PREP: NONE SEEN
Yeast Wet Prep HPF POC: NONE SEEN

## 2016-01-20 LAB — CBC
HEMATOCRIT: 35.5 % — AB (ref 36.0–46.0)
HEMOGLOBIN: 13.7 g/dL (ref 12.0–15.0)
MCH: 32.2 pg (ref 26.0–34.0)
MCHC: 38.6 g/dL — ABNORMAL HIGH (ref 30.0–36.0)
MCV: 83.3 fL (ref 78.0–100.0)
Platelets: 319 10*3/uL (ref 150–400)
RBC: 4.26 MIL/uL (ref 3.87–5.11)
RDW: 15.1 % (ref 11.5–15.5)
WBC: 14.5 10*3/uL — ABNORMAL HIGH (ref 4.0–10.5)

## 2016-01-20 LAB — MAGNESIUM: Magnesium: 2.1 mg/dL (ref 1.7–2.4)

## 2016-01-20 LAB — RAPID HIV SCREEN (HIV 1/2 AB+AG)
HIV 1/2 ANTIBODIES: NONREACTIVE
HIV-1 P24 ANTIGEN - HIV24: NONREACTIVE

## 2016-01-20 LAB — I-STAT CG4 LACTIC ACID, ED: Lactic Acid, Venous: 1.28 mmol/L (ref 0.5–2.0)

## 2016-01-20 LAB — TSH: TSH: 6.328 u[IU]/mL — AB (ref 0.350–4.500)

## 2016-01-20 LAB — LIPASE, BLOOD: LIPASE: 97 U/L — AB (ref 11–51)

## 2016-01-20 LAB — POC URINE PREG, ED: PREG TEST UR: NEGATIVE

## 2016-01-20 MED ORDER — FENTANYL CITRATE (PF) 100 MCG/2ML IJ SOLN
50.0000 ug | Freq: Once | INTRAMUSCULAR | Status: AC
  Administered 2016-01-20: 50 ug via INTRAVENOUS
  Filled 2016-01-20: qty 2

## 2016-01-20 MED ORDER — POTASSIUM CHLORIDE 10 MEQ/100ML IV SOLN
10.0000 meq | INTRAVENOUS | Status: AC
  Administered 2016-01-20 (×5): 10 meq via INTRAVENOUS
  Filled 2016-01-20 (×5): qty 100

## 2016-01-20 MED ORDER — PANTOPRAZOLE SODIUM 40 MG IV SOLR
40.0000 mg | Freq: Two times a day (BID) | INTRAVENOUS | Status: DC
  Administered 2016-01-20 – 2016-01-21 (×3): 40 mg via INTRAVENOUS
  Filled 2016-01-20 (×3): qty 40

## 2016-01-20 MED ORDER — POTASSIUM CHLORIDE 10 MEQ/100ML IV SOLN
10.0000 meq | INTRAVENOUS | Status: AC
  Administered 2016-01-20 – 2016-01-21 (×4): 10 meq via INTRAVENOUS
  Filled 2016-01-20 (×4): qty 100

## 2016-01-20 MED ORDER — POTASSIUM CHLORIDE CRYS ER 20 MEQ PO TBCR
40.0000 meq | EXTENDED_RELEASE_TABLET | ORAL | Status: AC
  Administered 2016-01-20: 40 meq via ORAL
  Filled 2016-01-20: qty 2

## 2016-01-20 MED ORDER — SODIUM CHLORIDE 0.9 % IV SOLN
INTRAVENOUS | Status: DC
  Administered 2016-01-20 – 2016-01-21 (×2): via INTRAVENOUS

## 2016-01-20 MED ORDER — ENOXAPARIN SODIUM 40 MG/0.4ML ~~LOC~~ SOLN
40.0000 mg | SUBCUTANEOUS | Status: DC
Start: 2016-01-20 — End: 2016-01-22
  Administered 2016-01-20 – 2016-01-22 (×3): 40 mg via SUBCUTANEOUS
  Filled 2016-01-20 (×3): qty 0.4

## 2016-01-20 MED ORDER — SODIUM CHLORIDE 0.9% FLUSH
3.0000 mL | Freq: Two times a day (BID) | INTRAVENOUS | Status: DC
  Administered 2016-01-20 – 2016-01-22 (×3): 3 mL via INTRAVENOUS

## 2016-01-20 MED ORDER — SODIUM CHLORIDE 0.9 % IV BOLUS (SEPSIS)
1000.0000 mL | Freq: Once | INTRAVENOUS | Status: AC
  Administered 2016-01-20: 1000 mL via INTRAVENOUS

## 2016-01-20 MED ORDER — ONDANSETRON 4 MG PO TBDP
4.0000 mg | ORAL_TABLET | Freq: Once | ORAL | Status: AC | PRN
  Administered 2016-01-20: 4 mg via ORAL
  Filled 2016-01-20: qty 1

## 2016-01-20 MED ORDER — IOPAMIDOL (ISOVUE-300) INJECTION 61%
100.0000 mL | Freq: Once | INTRAVENOUS | Status: AC | PRN
  Administered 2016-01-20: 100 mL via INTRAVENOUS

## 2016-01-20 MED ORDER — MAGNESIUM SULFATE 2 GM/50ML IV SOLN
2.0000 g | INTRAVENOUS | Status: AC
  Administered 2016-01-20: 2 g via INTRAVENOUS
  Filled 2016-01-20: qty 50

## 2016-01-20 MED ORDER — POTASSIUM CHLORIDE 10 MEQ/100ML IV SOLN
INTRAVENOUS | Status: AC
  Administered 2016-01-20: 10 meq
  Filled 2016-01-20: qty 100

## 2016-01-20 MED ORDER — ACETAMINOPHEN 650 MG RE SUPP: 650.0000 mg | Freq: Four times a day (QID) | RECTAL | Status: DC | PRN

## 2016-01-20 MED ORDER — POTASSIUM CHLORIDE CRYS ER 20 MEQ PO TBCR: 40.0000 meq | EXTENDED_RELEASE_TABLET | Freq: Two times a day (BID) | ORAL | Status: DC

## 2016-01-20 MED ORDER — ONDANSETRON HCL 4 MG/2ML IJ SOLN: 4.0000 mg | Freq: Three times a day (TID) | INTRAMUSCULAR | Status: DC | PRN

## 2016-01-20 MED ORDER — POTASSIUM CHLORIDE CRYS ER 20 MEQ PO TBCR: 40.0000 meq | EXTENDED_RELEASE_TABLET | ORAL | Status: DC

## 2016-01-20 MED ORDER — SODIUM CHLORIDE 0.9 % IV SOLN
INTRAVENOUS | Status: AC
  Administered 2016-01-20: 05:00:00 via INTRAVENOUS

## 2016-01-20 MED ORDER — ALBUTEROL SULFATE (2.5 MG/3ML) 0.083% IN NEBU
2.5000 mg | INHALATION_SOLUTION | RESPIRATORY_TRACT | Status: DC | PRN
  Filled 2016-01-20: qty 3

## 2016-01-20 MED ORDER — CEFTRIAXONE SODIUM 250 MG IJ SOLR
250.0000 mg | Freq: Once | INTRAMUSCULAR | Status: AC
  Administered 2016-01-20: 250 mg via INTRAMUSCULAR
  Filled 2016-01-20: qty 250

## 2016-01-20 MED ORDER — TRAMADOL HCL 50 MG PO TABS
100.0000 mg | ORAL_TABLET | Freq: Once | ORAL | Status: AC
  Administered 2016-01-20: 100 mg via ORAL
  Filled 2016-01-20: qty 2

## 2016-01-20 MED ORDER — POTASSIUM CHLORIDE CRYS ER 20 MEQ PO TBCR
40.0000 meq | EXTENDED_RELEASE_TABLET | Freq: Two times a day (BID) | ORAL | Status: DC
  Administered 2016-01-20: 40 meq via ORAL
  Filled 2016-01-20: qty 2

## 2016-01-20 MED ORDER — ONDANSETRON HCL 4 MG/2ML IJ SOLN
4.0000 mg | Freq: Once | INTRAMUSCULAR | Status: AC
  Administered 2016-01-20: 4 mg via INTRAVENOUS
  Filled 2016-01-20: qty 2

## 2016-01-20 MED ORDER — STERILE WATER FOR INJECTION IJ SOLN
INTRAMUSCULAR | Status: AC
  Administered 2016-01-20: 1 mL
  Filled 2016-01-20: qty 10

## 2016-01-20 MED ORDER — ACETAMINOPHEN 325 MG PO TABS
650.0000 mg | ORAL_TABLET | Freq: Four times a day (QID) | ORAL | Status: DC | PRN
  Administered 2016-01-21 – 2016-01-22 (×2): 650 mg via ORAL
  Filled 2016-01-20 (×2): qty 2

## 2016-01-20 MED ORDER — PROCHLORPERAZINE EDISYLATE 5 MG/ML IJ SOLN
10.0000 mg | INTRAMUSCULAR | Status: DC | PRN
  Administered 2016-01-22: 10 mg via INTRAVENOUS
  Filled 2016-01-20: qty 2

## 2016-01-20 MED ORDER — POTASSIUM CHLORIDE 10 MEQ/100ML IV SOLN
10.0000 meq | Freq: Once | INTRAVENOUS | Status: AC
  Administered 2016-01-20: 10 meq via INTRAVENOUS
  Filled 2016-01-20: qty 100

## 2016-01-20 MED ORDER — TRAMADOL HCL 50 MG PO TABS
100.0000 mg | ORAL_TABLET | Freq: Four times a day (QID) | ORAL | Status: DC | PRN
  Administered 2016-01-20 – 2016-01-22 (×6): 100 mg via ORAL
  Filled 2016-01-20 (×6): qty 2

## 2016-01-20 NOTE — Progress Notes (Signed)
Hoy Mornshley Provencher is a 40 y.o. female with an hx of GERD, sciatic pain who presents to the Emergency Department complaining of constant, moderate lower abdominal pain onset 1 week ago, associated with nausea, vomiting. She was found to be severely hyponatremic, hypokalemic. She was admitted earlier today by Dr Katrinka BlazingSmith, and please see his note for further details.  Continue to replete K. Monitor.   Edson SnowballVijaya Sumeet Geter,MD [phone]

## 2016-01-20 NOTE — ED Notes (Signed)
Pt from home with complaints of lower abdominal pain, flank pain, diarrhea, nausea and emesis. Pt states that she can keep down water, but nothing else. Pt states this has been going on since Monday. Pt states the pain feels sharp and is constant. Pt also has complaints of heartburn

## 2016-01-20 NOTE — H&P (Signed)
History and Physical    Julie Conley [id number] DOB: Feb 21, 1976 DOA: 01/19/2016  Referring MD/NP/PA: Eber HongBrian Miller, MD  PCP: Delbert HarnessBRISCOE, KIM, MD  Patient coming from: Home  Chief Complaint: Nausea and vomiting  HPI: Julie Conley is a 40 y.o. female with medical history significant of gastric bypass, status post hysterectomy,  and GERD; who presents with complaints of nausea and vomiting for the last 7 days. Patient notes that she had gumbo it days ago for her and her family like normal. Upon waking the following morning she notes acute onset of nausea, vomiting, and diarrhea. Initially thinking symptoms were caused by a virus she tried to wait it out. Reports trying Pepto-Bismol without relief. Reports having anywhere to 4-6 episodes of vomiting/retching per hour and anywhere from 4-5 bowel movements with watery green diarrhea. She continued to try to keep herself hydrated, but notes that she wasn't really able to keep anything down.  Diarrhea symptoms approximately 4-5 days ago stopped around the same time  that she stopped trying to take in any solid foods. No bowel movement in the last 3-4 days. However the nausea vomiting symptoms persisted. Patient had a fall which she is feels like she fractured her left second toe, but states that it would be all right. She reports associated symptoms of significant generalized weakness and weight loss. Patient states that her close do not fit anymore No other family members are currently sick or experiencing similar symptoms. Patient also notes recent sexual encounter with a known female partner who recently told her to go get checked and will not return home calls. She states they used a condom during sexual intercourse but at the end the condom broke.   ED Course:  Upon admission to the emergency department patient was seen haveVital signs within normal limits  Review of Systems: As per HPI otherwise 10 point review of systems negative.    Past  Medical History  Diagnosis Date  . GERD (gastroesophageal reflux disease)   . Complication of anesthesia     hypotension with breast augmentation  . Migraines     occasional  . Sciatic pain 04/12/2015    left  . Bimalleolar fracture of left ankle 04/2015    Past Surgical History  Procedure Laterality Date  . Cholecystectomy    . Cesarean section    . Appendectomy    . Tonsillectomy    . Gastric bypass    . Breast enhancement surgery    . Abdominoplasty    . Laparotomy  2011  . Abdominal hysterectomy  2013    complete  . Cystoscopy with hydrodistension and biopsy  2010  . Endometrial ablation    . Dilation and curettage of uterus    . Orif ankle fracture Left 04/15/2015    Procedure: OPEN REDUCTION INTERNAL FIXATION (ORIF) LEFT BIMALLEOLAR AND SYNDESMOSIS ANKLE FRACTURE;  Surgeon: Sheral Apleyimothy D Murphy, MD;  Location:  [facility];  Service: Orthopedics;  Laterality: Left;     reports that she quit smoking about 13 years ago. She has never used smokeless tobacco. She reports that she does not drink alcohol or use illicit drugs.  Allergies  Allergen Reactions  . Adhesive [Tape] Other (See Comments)    TEARS SKIN  . Hydrocodone Nausea Only    HALLUCINATIONS  . Ciprofloxacin Hives and Itching  . Sulfa Antibiotics Itching    Family History  Problem Relation Age of Onset  . Alcohol abuse Father   . Drug abuse Mother   . Drug  abuse Brother     Prior to Admission medications   Medication Sig Start Date End Date Taking? Authorizing Provider  ibuprofen (ADVIL,MOTRIN) 200 MG tablet Take 800 mg by mouth every 6 (six) hours as needed for headache or moderate pain.   Yes Historical Provider, MD  docusate sodium (COLACE) 100 MG capsule Take 1 capsule (100 mg total) by mouth 2 (two) times daily. Patient not taking: Reported on 01/20/2016 04/15/15   Janalee Dane, PA-C  ondansetron (ZOFRAN) 4 MG tablet Take 1 tablet (4 mg total) by mouth every 8 (eight) hours as needed for  nausea or vomiting. Patient not taking: Reported on 01/20/2016 04/15/15   Janalee Dane, PA-C  oxyCODONE-acetaminophen (PERCOCET) 5-325 MG per tablet Take 1-2 tablets by mouth every 4 (four) hours as needed for severe pain. Patient not taking: Reported on 01/20/2016 04/15/15   Janalee Dane, PA-C    Physical Exam: Filed Vitals:   01/19/16 2349 01/20/16 0216  BP: 123/82 116/79  Pulse: 78 89  Temp: 98.4 F (36.9 C)   TempSrc: Oral   Resp: 20 16  SpO2: 100% 100%      Constitutional: NAD, calm, comfortable Filed Vitals:   01/19/16 2349 01/20/16 0216  BP: 123/82 116/79  Pulse: 78 89  Temp: 98.4 F (36.9 C)   TempSrc: Oral   Resp: 20 16  SpO2: 100% 100%   Eyes: PERRL, lids and conjunctivae normal ENMT: Mucous membranes are moist. Posterior pharynx clear of any exudate or lesions.Normal dentition.  Neck: normal, supple, no masses, no thyromegaly Respiratory: clear to auscultation bilaterally, no wheezing, no crackles. Normal respiratory effort. No accessory muscle use.  Cardiovascular: Regular rate and rhythm, no murmurs / rubs / gallops. No extremity edema. 2+ pedal pulses. No carotid bruits.  Abdomen: no tenderness, no masses palpated. No hepatosplenomegaly. Bowel sounds positive.  Musculoskeletal: no clubbing / cyanosis. No joint deformity upper and lower extremities. Good ROM, no contractures. Normal muscle tone.  Skin: no rashes, lesions, ulcers. No induration Neurologic: CN 2-12 grossly intact. Sensation intact, DTR normal. Strength 5/5 in all 4.  Psychiatric: Normal judgment and insight. Alert and oriented x 3. Normal mood.    Labs on Admission: I have personally reviewed following labs and imaging studies  CBC:  Recent Labs Lab 01/20/16 0028  WBC 14.5*  HGB 13.7  HCT 35.5*  MCV 83.3  PLT 319   Basic Metabolic Panel:  Recent Labs Lab 01/20/16 0028  NA 120*  K <2.0*  CL 94*  CO2 14*  GLUCOSE 161*  BUN 19  CREATININE 0.96  CALCIUM 9.5   GFR: CrCl  cannot be calculated (Unknown ideal weight.). Liver Function Tests:  Recent Labs Lab 01/20/16 0028  AST 45*  ALT 33  ALKPHOS 71  BILITOT 0.5  PROT 7.2  ALBUMIN 4.2    Recent Labs Lab 01/20/16 0028  LIPASE 97*   No results for input(s): AMMONIA in the last 168 hours. Coagulation Profile: No results for input(s): INR, PROTIME in the last 168 hours. Cardiac Enzymes:  Recent Labs Lab 01/20/16 0243  TROPONINI <0.03   BNP (last 3 results) No results for input(s): PROBNP in the last 8760 hours. HbA1C: No results for input(s): HGBA1C in the last 72 hours. CBG: No results for input(s): GLUCAP in the last 168 hours. Lipid Profile: No results for input(s): CHOL, HDL, LDLCALC, TRIG, CHOLHDL, LDLDIRECT in the last 72 hours. Thyroid Function Tests: No results for input(s): TSH, T4TOTAL, FREET4, T3FREE, THYROIDAB in the last 72 hours. Anemia Panel:  No results for input(s): VITAMINB12, FOLATE, FERRITIN, TIBC, IRON, RETICCTPCT in the last 72 hours. Urine analysis:    Component Value Date/Time   COLORURINE YELLOW 01/20/2016 0118   APPEARANCEUR CLOUDY* 01/20/2016 0118   LABSPEC 1.008 01/20/2016 0118   PHURINE 6.5 01/20/2016 0118   GLUCOSEU NEGATIVE 01/20/2016 0118   HGBUR NEGATIVE 01/20/2016 0118   BILIRUBINUR NEGATIVE 01/20/2016 0118   KETONESUR NEGATIVE 01/20/2016 0118   PROTEINUR NEGATIVE 01/20/2016 0118   UROBILINOGEN 1.0 02/26/2013 1916   NITRITE NEGATIVE 01/20/2016 0118   LEUKOCYTESUR NEGATIVE 01/20/2016 0118   Sepsis Labs: @LABRCNTIP (procalcitonin:4,lacticidven:4) )No results found for this or any previous visit (from the past 240 hour(s)).   Radiological Exams on Admission: Ct Abdomen Pelvis W Contrast  01/20/2016  CLINICAL DATA:  Lower abdominal pain for 1 week. Nausea, vomiting, and diarrhea for 2 days. No bowel movements for 5 days. Recent antibiotics for kidney infection. Elevated white cell count. Elevated lipase. Multiple abdominal surgeries. EXAM: CT ABDOMEN  AND PELVIS WITH CONTRAST TECHNIQUE: Multidetector CT imaging of the abdomen and pelvis was performed using the standard protocol following bolus administration of intravenous contrast. CONTRAST:  ISOVUE-300 IOPAMIDOL (ISOVUE-300) INJECTION 61% COMPARISON:  None. FINDINGS: Minimal dependent changes in the lung bases. Bilateral breast implants. Diffuse fatty infiltration of the liver. Surgical absence of the gallbladder. No bile duct dilatations. The pancreas, spleen, adrenal glands, kidneys, abdominal aorta, inferior vena cava, and retroperitoneal lymph nodes are unremarkable. The postoperative changes consistent with gastric bypass. Stomach, small bowel, and colon are not abnormally distended. Scattered stool throughout the colon. Air-fluid levels of the transverse colon associated with liquid stool. No free air or free fluid in the abdomen. Pelvis: Surgical absence of the appendix. Surgical absence of the uterus. No pelvic mass or lymphadenopathy. No free or loculated pelvic fluid collections. Bladder wall is not thickened. Mild degenerative changes in the spine. No destructive bone lesions. IMPRESSION: Diffuse fatty infiltration of the liver. Postoperative changes consistent with gastric bypass. No small or large bowel distention to suggest obstruction. No bowel wall thickening or inflammatory changes. Electronically Signed   By: Burman Nieves M.D.   On: 01/20/2016 02:57    EKG: Independently reviewed. Sinus rhythm with early repolarization  Assessment/Plan Intractable nausea and vomiting: Acute. Symptoms have persisted over the last 7 days. Question possibility of acute gastroenteritis versus withdrawal versus other causes for persistent nausea or vomiting. - Admit to a telemetry bed - Zofran/ compazine prn nausea and vomiting - Advance diet as tolerated to regular diet - check I&Os - Check UDS  Leukocytosis of 14.6 on admission. Lactic acid level only noted to be 1.28 - Check blood  cultures - Continue to monitor  Severe hypokalemia: Acute. Potassium level noted to be less than 2. Given 2 g magnesium sulfate and 10 mEq of potassium chloride in the ED. - Potassium chloride 60 meq IV and  potassium chloride 40 meq po stat  - Will need to monitor and continue to replace throughout the day  Abnormal EKG: Patient found to have early repolarization  - Consider repeating EKG once electrolytes replaced   Severe hyponatremia and dehydration: Acute. Sodium level noted to be 20 on admission patient given 2 L of IV fluid in the ED. - BMP q 4 hrs to avoid overcorrection too quickly - Goal do not overcorrect sodium levels greater than 10 mEq over a 24-hour period. -We'll need to reassess IV fluid replacement  Hyperglycemia: Initial blood glucose 161 on admission. - Check a hemoglobin A1c  High risk sexual behavior: Patient reports recent high-risk sexual behavior. Given 250 mg of ceftriaxone intramuscularly. Wet prep and urinalysis show no acute signs of infection. - Checking HIV screen and RPR  Elevated lipase: On admission lipase 92, but not 3 times limit to suggest acute pancreatitis no CT scan evidence of acute inflammation noted either.. - May warrant rechecking in a.m.   GERD  - protonix IV for now   Status post gastric bypass - Continue to monitor  DVT prophylaxis: lovenox Code Status: Full Family Communication: none Disposition Plan: Hospital DC home in 1-2 days Clydie Braun MD [facility] Pager [phone]  If 7PM-7AM, please contact night-coverage [email or web address] Password [facility]  01/20/2016, 3:32 AM

## 2016-01-20 NOTE — ED Notes (Signed)
Pt is in Ct.  Will get troponin when they arrive back in room.

## 2016-01-20 NOTE — ED Provider Notes (Signed)
CSN: [id number]     Arrival date & time 01/19/16  2333 History  By signing my name below, I, [facility], attest that this documentation has been prepared under the direction and in the presence of Eber Hong, MD. Electronically Signed: Randell Patient, ED Scribe. 01/20/2016. 3:21 AM.   Chief Complaint  Patient presents with  . Flank Pain  . Abdominal Pain  . Emesis   The history is provided by the patient. No language interpreter was used.   HPI Comments: Julie Conley is a 40 y.o. female with an hx of GERD, sciatic pain who presents to the Emergency Department complaining of constant, moderate lower abdominal pain onset 1 week ago. Pt states that she had sexual intercourse with a partner 9 days ago in which the condom broke and received a text message from this same partner telling her to get checked out 8 days ago. She reports nausea followed by diarrhea that lasted for 2 days, vomiting, dizziness, difficulty ambulating secondary to pain, burning CP that she describes as heartburn, lower abdominal pain, and lower back pain. She has not had a BM in the past 4-5 days. She has been unable to eat secondary to emesis and has been only able to drink water. Denies recent sick contact with people with similar symptoms. She finished a ciprofloxacin course 2 weeks ago for a kidney infection. She has a hx of cholecystectomy, appendectomy, gastric bypass, abdominoplasty, laparotomy, abdominal hysterectomy. Denies abdominal distension or any other symptoms currently. Denies any other symptoms currently.  Past Medical History  Diagnosis Date  . GERD (gastroesophageal reflux disease)   . Complication of anesthesia     hypotension with breast augmentation  . Migraines     occasional  . Sciatic pain 04/12/2015    left  . Bimalleolar fracture of left ankle 04/2015   Past Surgical History  Procedure Laterality Date  . Cholecystectomy    . Cesarean section    . Appendectomy    .  Tonsillectomy    . Gastric bypass    . Breast enhancement surgery    . Abdominoplasty    . Laparotomy  2011  . Abdominal hysterectomy  2013    complete  . Cystoscopy with hydrodistension and biopsy  2010  . Endometrial ablation    . Dilation and curettage of uterus    . Orif ankle fracture Left 04/15/2015    Procedure: OPEN REDUCTION INTERNAL FIXATION (ORIF) LEFT BIMALLEOLAR AND SYNDESMOSIS ANKLE FRACTURE;  Surgeon: Sheral Apley, MD;  Location: [facility];  Service: Orthopedics;  Laterality: Left;   Family History  Problem Relation Age of Onset  . Alcohol abuse Father   . Drug abuse Mother   . Drug abuse Brother    Social History  Substance Use Topics  . Smoking status: Former Smoker    Quit date: 01/05/2003  . Smokeless tobacco: Never Used  . Alcohol Use: No   OB History    No data available     Review of Systems  Gastrointestinal: Positive for nausea, vomiting, abdominal pain and diarrhea. Negative for abdominal distention.  Musculoskeletal: Positive for back pain.  Neurological: Positive for dizziness.  All other systems reviewed and are negative.   Allergies  Adhesive; Hydrocodone; Ciprofloxacin; and Sulfa antibiotics  Home Medications   Prior to Admission medications   Medication Sig Start Date End Date Taking? Authorizing Provider  ibuprofen (ADVIL,MOTRIN) 200 MG tablet Take 800 mg by mouth every 6 (six) hours as needed for headache or  moderate pain.   Yes Historical Provider, MD  docusate sodium (COLACE) 100 MG capsule Take 1 capsule (100 mg total) by mouth 2 (two) times daily. Patient not taking: Reported on 01/20/2016 04/15/15   Janalee Dane, PA-C  ondansetron (ZOFRAN) 4 MG tablet Take 1 tablet (4 mg total) by mouth every 8 (eight) hours as needed for nausea or vomiting. Patient not taking: Reported on 01/20/2016 04/15/15   Janalee Dane, PA-C  oxyCODONE-acetaminophen (PERCOCET) 5-325 MG per tablet Take 1-2 tablets by mouth every 4 (four) hours  as needed for severe pain. Patient not taking: Reported on 01/20/2016 04/15/15   Janalee Dane, PA-C   BP 128/83 mmHg  Pulse 90  Temp(Src) 98.9 F (37.2 C) (Oral)  Resp 16  Ht 5\' 4"  (1.626 m)  Wt 188 lb 8 oz (85.503 kg)  BMI 32.34 kg/m2  SpO2 95% Physical Exam  Constitutional: She appears well-developed and well-nourished. No distress.  HENT:  Head: Normocephalic and atraumatic.  Mouth/Throat: Oropharynx is clear and moist. Mucous membranes are dry. No oropharyngeal exudate.  Eyes: Conjunctivae and EOM are normal. Pupils are equal, round, and reactive to light. Right eye exhibits no discharge. Left eye exhibits no discharge. No scleral icterus.  Neck: Normal range of motion. Neck supple. No JVD present. No thyromegaly present.  Cardiovascular: Regular rhythm, normal heart sounds and intact distal pulses.  Tachycardia present.  Exam reveals no gallop and no friction rub.   No murmur heard. Borderline tachycardia. No murmurs.  Pulmonary/Chest: Effort normal and breath sounds normal. No respiratory distress. She has no wheezes. She has no rales.  Abdominal: Soft. She exhibits no distension and no mass. Bowel sounds are decreased. There is tenderness.  Hypoactive bowel sounds. Diffuse abdominal tenderness but focused over the lower abdomen. No guarding or peritoneal signs.  Musculoskeletal: Normal range of motion. She exhibits no edema or tenderness.  Lymphadenopathy:    She has no cervical adenopathy.  Neurological: She is alert. Coordination normal.  Skin: Skin is warm and dry. No rash noted. No erythema.  Psychiatric: She has a normal mood and affect. Her behavior is normal.  Nursing note and vitals reviewed.   ED Course  Procedures   DIAGNOSTIC STUDIES: Oxygen Saturation is 100% on RA, normal by my interpretation.    COORDINATION OF CARE: 1:07 AM Will order IV fluids, Zofran, abdomen CT scan, labs, magnesium sulfate, potassium chloride, fentanyl.  Discussed treatment plan with  pt at bedside and pt agreed to plan.  3:20 AM Returned to discuss lab results. Will admit to hospital.  Labs Review Labs Reviewed  URINE CULTURE - Abnormal; Notable for the following:    Culture   (*)    Value: <10,000 COLONIES/mL INSIGNIFICANT GROWTH Performed at [facility]    All other components within normal limits  LIPASE, BLOOD - Abnormal; Notable for the following:    Lipase 97 (*)    All other components within normal limits  COMPREHENSIVE METABOLIC PANEL - Abnormal; Notable for the following:    Sodium 120 (*)    Potassium <2.0 (*)    Chloride 94 (*)    CO2 14 (*)    Glucose, Bld 161 (*)    AST 45 (*)    All other components within normal limits  CBC - Abnormal; Notable for the following:    WBC 14.5 (*)    HCT 35.5 (*)    MCHC 38.6 (*)    All other components within normal limits  URINALYSIS, ROUTINE W REFLEX MICROSCOPIC (  NOT AT [facility]) - Abnormal; Notable for the following:    APPearance CLOUDY (*)    All other components within normal limits  TSH - Abnormal; Notable for the following:    TSH 6.328 (*)    All other components within normal limits  BASIC METABOLIC PANEL - Abnormal; Notable for the following:    Sodium 125 (*)    Potassium 2.4 (*)    CO2 16 (*)    Glucose, Bld 135 (*)    Calcium 7.7 (*)    All other components within normal limits  BASIC METABOLIC PANEL - Abnormal; Notable for the following:    Sodium 127 (*)    Potassium 2.9 (*)    CO2 17 (*)    Glucose, Bld 107 (*)    Calcium 7.9 (*)    All other components within normal limits  BASIC METABOLIC PANEL - Abnormal; Notable for the following:    Sodium 129 (*)    Potassium 2.8 (*)    CO2 17 (*)    Glucose, Bld 111 (*)    Calcium 7.8 (*)    All other components within normal limits  CBC - Abnormal; Notable for the following:    WBC 11.3 (*)    RBC 3.22 (*)    Hemoglobin 10.2 (*)    HCT 27.8 (*)    MCHC 36.7 (*)    RDW 15.8 (*)    All other components within normal limits   BASIC METABOLIC PANEL - Abnormal; Notable for the following:    Sodium 131 (*)    Potassium 3.2 (*)    CO2 18 (*)    Glucose, Bld 101 (*)    Calcium 8.1 (*)    All other components within normal limits  WET PREP, GENITAL  CULTURE, BLOOD (ROUTINE X 2)  CULTURE, BLOOD (ROUTINE X 2)  TROPONIN I  RAPID HIV SCREEN (HIV 1/2 AB+AG)  HIV ANTIBODY (ROUTINE TESTING)  RPR  HEMOGLOBIN A1C  URINE RAPID DRUG SCREEN, HOSP PERFORMED  TROPONIN I  TROPONIN I  MAGNESIUM  T3, FREE  T4, FREE  CHLAMYDIA PANEL SERUM  POC URINE PREG, ED  I-STAT CG4 LACTIC ACID, ED  GC/CHLAMYDIA PROBE AMP ([place]) NOT AT [facility]    Imaging Review No results found. I have personally reviewed and evaluated these images and lab results as part of my medical decision-making.   EKG Interpretation   Date/Time:  Monday Jan 20 2016 02:04:08 EDT Ventricular Rate:  86 PR Interval:  169 QRS Duration: 99 QT Interval:  352 QTC Calculation: 421 R Axis:   173 Text Interpretation:  Sinus rhythm Right axis deviation Abnormal R-wave  progression, late transition ST elev, probable normal early repol pattern  Since last tracing isolated ST abnormaltiy in V2 Abnormal ekg Confirmed by  Asharia Lotter  MD, Belton Peplinski ([id number]) on 01/20/2016 2:38:35 AM Also confirmed by Hyacinth Meeker   MD, Analie Katzman ([id number]), editor [place]  CCT, BEVERLY ([id number])  on 01/20/2016  9:40:07 AM      MDM   Final diagnoses:  Hypokalemia  Hyponatremia  Non-intractable vomiting with nausea, vomiting of unspecified type    I personally performed the services described in this documentation, which was scribed in my presence. The recorded information has been reviewed and is accurate.   The patient does have a severe electrolyte abnormality with essentially undetectable potassium as well as low sodium at 120. She has required admission to the hospital due to the significant abnormalities. I have given her 2 L of IV  fluids, magnesium supplementation and potassium repletion.  She has not had any cardiac abnormalities or arrhythmias and her EKG does not show a prolonged QT.  I have requested admission with Dr. Katrinka BlazingSmith who will admit the patient to the telemetry unit for cardiac monitoring  CRITICAL CARE Performed by: Vida RollerBrian D Javier Mamone Total critical care time: 35 minutes Critical care time was exclusive of separately billable procedures and treating other patients. Critical care was necessary to treat or prevent imminent or life-threatening deterioration. Critical care was time spent personally by me on the following activities: development of treatment plan with patient and/or surrogate as well as nursing, discussions with consultants, evaluation of patient's response to treatment, examination of patient, obtaining history from patient or surrogate, ordering and performing treatments and interventions, ordering and review of laboratory studies, ordering and review of radiographic studies, pulse oximetry and re-evaluation of patient's condition.    Eber HongBrian Kayden Hutmacher, MD 01/22/16 [phone]

## 2016-01-20 NOTE — ED Notes (Signed)
Ambulating to bathroom with assistance.

## 2016-01-21 DIAGNOSIS — R1110 Vomiting, unspecified: Secondary | ICD-10-CM

## 2016-01-21 DIAGNOSIS — E871 Hypo-osmolality and hyponatremia: Secondary | ICD-10-CM

## 2016-01-21 DIAGNOSIS — E876 Hypokalemia: Secondary | ICD-10-CM

## 2016-01-21 LAB — CBC
HEMATOCRIT: 27.8 % — AB (ref 36.0–46.0)
Hemoglobin: 10.2 g/dL — ABNORMAL LOW (ref 12.0–15.0)
MCH: 31.7 pg (ref 26.0–34.0)
MCHC: 36.7 g/dL — ABNORMAL HIGH (ref 30.0–36.0)
MCV: 86.3 fL (ref 78.0–100.0)
PLATELETS: 266 10*3/uL (ref 150–400)
RBC: 3.22 MIL/uL — ABNORMAL LOW (ref 3.87–5.11)
RDW: 15.8 % — AB (ref 11.5–15.5)
WBC: 11.3 10*3/uL — AB (ref 4.0–10.5)

## 2016-01-21 LAB — HEMOGLOBIN A1C
Hgb A1c MFr Bld: 5.3 % (ref 4.8–5.6)
MEAN PLASMA GLUCOSE: 105 mg/dL

## 2016-01-21 LAB — BASIC METABOLIC PANEL
Anion gap: 7 (ref 5–15)
BUN: 12 mg/dL (ref 6–20)
CO2: 18 mmol/L — AB (ref 22–32)
CREATININE: 0.77 mg/dL (ref 0.44–1.00)
Calcium: 8.1 mg/dL — ABNORMAL LOW (ref 8.9–10.3)
Chloride: 106 mmol/L (ref 101–111)
GFR calc non Af Amer: >60 mL/min (ref >60)
GLUCOSE: 101 mg/dL — AB (ref 65–99)
Potassium: 3.2 mmol/L — ABNORMAL LOW (ref 3.5–5.1)
SODIUM: 131 mmol/L — AB (ref 135–145)

## 2016-01-21 LAB — URINE CULTURE: Culture: <10000 — AB

## 2016-01-21 LAB — T4, FREE: Free T4: 1.03 ng/dL (ref 0.61–1.12)

## 2016-01-21 LAB — RPR: RPR Ser Ql: NONREACTIVE

## 2016-01-21 LAB — HIV ANTIBODY (ROUTINE TESTING W REFLEX): HIV Screen 4th Generation wRfx: NONREACTIVE

## 2016-01-21 MED ORDER — PANTOPRAZOLE SODIUM 40 MG PO TBEC
40.0000 mg | DELAYED_RELEASE_TABLET | Freq: Every day | ORAL | Status: DC
  Administered 2016-01-22: 40 mg via ORAL
  Filled 2016-01-21: qty 1

## 2016-01-21 MED ORDER — POTASSIUM CHLORIDE CRYS ER 20 MEQ PO TBCR
40.0000 meq | EXTENDED_RELEASE_TABLET | Freq: Two times a day (BID) | ORAL | Status: AC
  Administered 2016-01-21 (×2): 40 meq via ORAL
  Filled 2016-01-21 (×2): qty 2

## 2016-01-21 NOTE — Progress Notes (Signed)
PROGRESS NOTE    Julie Conley  ZOX:[id number] DOB: April 04, 1976 DOA: 01/19/2016 PCP: Delbert Harness, MD    Brief Narrative: Julie Conley is a 40 y.o. female with an hx of GERD, sciatic pain who presents to the Emergency Department complaining of constant, moderate lower abdominal pain onset 1 week ago, associated with nausea, vomiting. She was found to be severely hyponatremic, hypokalemic   Assessment & Plan:   Principal Problem:   Intractable nausea and vomiting Active Problems:   Hypokalemia   Elevated lipase   Hyperglycemia   Unprotected sex   Hyponatremia  Intractable Nausea and Vomiting: possibly acute gastroenteritis.  Symptomatic management with IV anti emetics and IV fluids.  Improved when compared to yesterday but not resolved.    Leukocytosis: Probably reactive. Check CBC in am.    Severe Hypokalemia and Hypomagnesemia and Hyponatremia; probably from dehydration from the above.  Improved with supplementation.  Repeat levels in am.   Abnormal EKG: repeat TODAY.    High Risk Sexual Behavior: Chlamydia panel pending.  HIV negative. RPR pending.  IM Rocephin given.  UA i s negative   GERD: STABLE.    S/p Gastric Bypass: - stable.   DVT prophylaxis: (Lovenox) Code Status: (Full/) Family Communication: none at bedside.  Disposition Plan: possibly home in am.    Consultants:  none  Procedures: none   Antimicrobials: none   Subjective: Still feel very weak and nauseated.   Objective: Filed Vitals:   01/20/16 0549 01/20/16 1325 01/20/16 2044 01/21/16 0502  BP: 138/93  106/83 117/77  Pulse: 87  91 92  Temp: 98.3 F (36.8 C) 97.7 F (36.5 C) 97.8 F (36.6 C) 97.5 F (36.4 C)  TempSrc: Oral Axillary Oral Oral  Resp: 16  16 16   Height: 5\' 4"  (1.626 m)     Weight: 85.503 kg (188 lb 8 oz)     SpO2: 100% 100% 100% 100%    Intake/Output Summary (Last 24 hours) at 01/21/16 1204 Last data filed at 01/21/16 0852  Gross per 24 hour    Intake 1592.5 ml  Output      0 ml  Net 1592.5 ml   Filed Weights   01/20/16 0549  Weight: 85.503 kg (188 lb 8 oz)    Examination:  General exam: Appears calm and comfortable  Respiratory system: Clear to auscultation. Respiratory effort normal. Cardiovascular system: S1 & S2 heard, RRR. No JVD, murmurs, rubs, gallops or clicks. No pedal edema. Gastrointestinal system: Abdomen is nondistended, soft and nontender. No organomegaly or masses felt. Normal bowel sounds heard. Central nervous system: Alert and oriented. No focal neurological deficits. Extremities: Symmetric 5 x 5 power. Skin: No rashes, lesions or ulcers Psychiatry: Judgement and insight appear normal. Mood & affect appropriate.     Data Reviewed: I have personally reviewed following labs and imaging studies  CBC:  Recent Labs Lab 01/20/16 0028 01/21/16 0459  WBC 14.5* 11.3*  HGB 13.7 10.2*  HCT 35.5* 27.8*  MCV 83.3 86.3  PLT 319 266   Basic Metabolic Panel:  Recent Labs Lab 01/20/16 0028 01/20/16 0757 01/20/16 1142 01/20/16 1528 01/21/16 0459  NA 120* 125* 127* 129* 131*  K <2.0* 2.4* 2.9* 2.8* 3.2*  CL 94* 101 103 106 106  CO2 14* 16* 17* 17* 18*  GLUCOSE 161* 135* 107* 111* 101*  BUN 19 14 12 12 12   CREATININE 0.96 0.85 0.80 0.93 0.77  CALCIUM 9.5 7.7* 7.9* 7.8* 8.1*  MG  --   --   --  2.1  --    GFR: Estimated Creatinine Clearance: 98.9 mL/min (by C-G formula based on Cr of 0.77). Liver Function Tests:  Recent Labs Lab 01/20/16 0028  AST 45*  ALT 33  ALKPHOS 71  BILITOT 0.5  PROT 7.2  ALBUMIN 4.2    Recent Labs Lab 01/20/16 0028  LIPASE 97*   No results for input(s): AMMONIA in the last 168 hours. Coagulation Profile: No results for input(s): INR, PROTIME in the last 168 hours. Cardiac Enzymes:  Recent Labs Lab 01/20/16 0243 01/20/16 0441 01/20/16 0757  TROPONINI <0.03 <0.03 <0.03   BNP (last 3 results) No results for input(s): PROBNP in the last 8760  hours. HbA1C:  Recent Labs  01/20/16 0028  HGBA1C 5.3   CBG: No results for input(s): GLUCAP in the last 168 hours. Lipid Profile: No results for input(s): CHOL, HDL, LDLCALC, TRIG, CHOLHDL, LDLDIRECT in the last 72 hours. Thyroid Function Tests:  Recent Labs  01/20/16 0441 01/21/16 0459  TSH 6.328*  --   FREET4  --  1.03   Anemia Panel: No results for input(s): VITAMINB12, FOLATE, FERRITIN, TIBC, IRON, RETICCTPCT in the last 72 hours. Sepsis Labs:  Recent Labs Lab 01/20/16 0218  LATICACIDVEN 1.28    Recent Results (from the past 240 hour(s))  Urine culture     Status: Abnormal   Collection Time: 01/20/16  1:18 AM  Result Value Ref Range Status   Specimen Description URINE, CLEAN CATCH  Final   Special Requests NONE  Final   Culture (A)  Final    <10,000 COLONIES/mL INSIGNIFICANT GROWTH Performed at [facility]    Report Status 01/21/2016 FINAL  Final  Wet prep, genital     Status: None   Collection Time: 01/20/16  3:55 AM  Result Value Ref Range Status   Yeast Wet Prep HPF POC NONE SEEN NONE SEEN Final   Trich, Wet Prep NONE SEEN NONE SEEN Final   Clue Cells Wet Prep HPF POC NONE SEEN NONE SEEN Final   WBC, Wet Prep HPF POC NONE SEEN NONE SEEN Final   Sperm NONE SEEN  Final         Radiology Studies: Ct Abdomen Pelvis W Contrast  01/20/2016  CLINICAL DATA:  Lower abdominal pain for 1 week. Nausea, vomiting, and diarrhea for 2 days. No bowel movements for 5 days. Recent antibiotics for kidney infection. Elevated white cell count. Elevated lipase. Multiple abdominal surgeries. EXAM: CT ABDOMEN AND PELVIS WITH CONTRAST TECHNIQUE: Multidetector CT imaging of the abdomen and pelvis was performed using the standard protocol following bolus administration of intravenous contrast. CONTRAST:  ISOVUE-300 IOPAMIDOL (ISOVUE-300) INJECTION 61% COMPARISON:  None. FINDINGS: Minimal dependent changes in the lung bases. Bilateral breast implants. Diffuse fatty  infiltration of the liver. Surgical absence of the gallbladder. No bile duct dilatations. The pancreas, spleen, adrenal glands, kidneys, abdominal aorta, inferior vena cava, and retroperitoneal lymph nodes are unremarkable. The postoperative changes consistent with gastric bypass. Stomach, small bowel, and colon are not abnormally distended. Scattered stool throughout the colon. Air-fluid levels of the transverse colon associated with liquid stool. No free air or free fluid in the abdomen. Pelvis: Surgical absence of the appendix. Surgical absence of the uterus. No pelvic mass or lymphadenopathy. No free or loculated pelvic fluid collections. Bladder wall is not thickened. Mild degenerative changes in the spine. No destructive bone lesions. IMPRESSION: Diffuse fatty infiltration of the liver. Postoperative changes consistent with gastric bypass. No small or large bowel distention to suggest  obstruction. No bowel wall thickening or inflammatory changes. Electronically Signed   By: Burman NievesWilliam  Stevens M.D.   On: 01/20/2016 02:57        Scheduled Meds: . enoxaparin (LOVENOX) injection  40 mg Subcutaneous Q24H  . [START ON 01/22/2016] pantoprazole  40 mg Oral Q0600  . potassium chloride  40 mEq Oral BID  . sodium chloride flush  3 mL Intravenous Q12H   Continuous Infusions: . sodium chloride 75 mL/hr at 01/21/16 1005     LOS: 1 day    Time spent: 25 minutes.     Kathlen ModyAKULA,Theotis Gerdeman, MD [facility] Pager [phone]   If 7PM-7AM, please contact night-coverage [email or web address] Password [email or web address] 01/21/2016, 12:04 PM

## 2016-01-21 NOTE — Care Management Note (Signed)
Case Management Note  Patient Details  Name: Julie Conley MRN: [id number] Date of Birth: 1975/10/13  Subjective/Objective:40 y/o f admitted w/intractable n/v. From home. Has pcp.No insurance-financial counselor already following for medicaid applic f/u after  D/c.                  Action/Plan:d/c plan home.   Expected Discharge Date:                  Expected Discharge Plan:  Home/Self Care  In-House Referral:     Discharge planning Services  CM Consult  Post Acute Care Choice:    Choice offered to:     DME Arranged:    DME Agency:     HH Arranged:    HH Agency:     Status of Service:  In process, will continue to follow  Medicare Important Message Given:    Date Medicare IM Given:    Medicare IM give by:    Date Additional Medicare IM Given:    Additional Medicare Important Message give by:     If discussed at Long Length of Stay Meetings, dates discussed:    Additional Comments:  Lanier ClamMahabir, Naeema Patlan, RN 01/21/2016, 3:47 PM

## 2016-01-22 LAB — BASIC METABOLIC PANEL
ANION GAP: 4 — AB (ref 5–15)
BUN: 8 mg/dL (ref 6–20)
CALCIUM: 8.2 mg/dL — AB (ref 8.9–10.3)
CO2: 21 mmol/L — AB (ref 22–32)
CREATININE: 0.66 mg/dL (ref 0.44–1.00)
Chloride: 109 mmol/L (ref 101–111)
GFR calc non Af Amer: >60 mL/min (ref >60)
Glucose, Bld: 92 mg/dL (ref 65–99)
Potassium: 3.3 mmol/L — ABNORMAL LOW (ref 3.5–5.1)
SODIUM: 134 mmol/L — AB (ref 135–145)

## 2016-01-22 LAB — T3, FREE: T3, Free: 3.1 pg/mL (ref 2.0–4.4)

## 2016-01-22 MED ORDER — POTASSIUM CHLORIDE CRYS ER 20 MEQ PO TBCR: 40.0000 meq | EXTENDED_RELEASE_TABLET | Freq: Two times a day (BID) | ORAL | Status: DC

## 2016-01-22 MED ORDER — POTASSIUM CHLORIDE CRYS ER 20 MEQ PO TBCR
60.0000 meq | EXTENDED_RELEASE_TABLET | Freq: Once | ORAL | Status: AC
  Administered 2016-01-22: 60 meq via ORAL
  Filled 2016-01-22: qty 3

## 2016-01-22 NOTE — Care Management Note (Signed)
Case Management Note  Patient Details  Name: Hoy Mornshley Lemme MRN: [id number] Date of Birth: [birth year]  Subjective/Objective:                    Action/Plan:d/c home no needs or orders.   Expected Discharge Date:                  Expected Discharge Plan:  Home/Self Care  In-House Referral:     Discharge planning Services  CM Consult  Post Acute Care Choice:    Choice offered to:     DME Arranged:    DME Agency:     HH Arranged:    HH Agency:     Status of Service:  Completed, signed off  Medicare Important Message Given:    Date Medicare IM Given:    Medicare IM give by:    Date Additional Medicare IM Given:    Additional Medicare Important Message give by:     If discussed at Long Length of Stay Meetings, dates discussed:    Additional Comments:  Lanier ClamMahabir, Elbridge Magowan, RN 01/22/2016, 3:10 PM

## 2016-01-22 NOTE — Progress Notes (Signed)
Pt. Lab values came back K+ 3.3 and Na 134. MD made aware.

## 2016-01-22 NOTE — Progress Notes (Signed)
Pt. Given discharge instructions, education and all questions answered. Pt. Given 60 mEq before discharge. IV discontinued and CCMD called to d/c tele. Pt. Discharged with daughter to home and pt. Ambulated downstairs to car, refused wheelchair.

## 2016-01-24 LAB — CHLAMYDIA PANEL SERUM
C. Pneumoniae IgG Serum: 1:256 {titer} — AB
C. Trachomatis IgG Serum: <1:64 {titer}
C. Trachomatis IgM Serum: <1:20 {titer}

## 2016-01-25 LAB — CULTURE, BLOOD (ROUTINE X 2)
CULTURE: NO GROWTH
Culture: NO GROWTH

## 2016-01-29 NOTE — Discharge Summary (Signed)
Physician Discharge Summary  Alois Mincer ZOX:[id number] DOB: 06/03/1976 DOA: 01/19/2016  PCP: Delbert Harness, MD  Admit date: 01/19/2016 Discharge date: 01/22/2016  Time spent: 20 minutes  Recommendations for Outpatient Follow-up:  1. Follow up with PCP in 2  Weeks.    Discharge Diagnoses:  Principal Problem:   Intractable nausea and vomiting Active Problems:   Hypokalemia   Elevated lipase   Hyperglycemia   Unprotected sex   Hyponatremia   Discharge Condition: improved  Diet recommendation: regular .  Filed Weights   01/20/16 0549  Weight: 85.503 kg (188 lb 8 oz)    History of present illness:  Julie Conley is a 40 y.o. female with an hx of GERD, sciatic pain who presents to the Emergency Department complaining of constant, moderate lower abdominal pain onset 1 week ago, associated with nausea, vomiting. She was found to be severely hyponatremic, hypokalemic  Hospital Course:  Intractable Nausea and Vomiting: possibly acute gastroenteritis.  Symptomatic management with IV anti emetics and IV fluids.  Resolved.    Leukocytosis: Probably reactive. Improved.    Severe Hypokalemia and Hypomagnesemia and Hyponatremia; probably from dehydration from the above.  Improved with supplementation.     High Risk Sexual Behavior: Chlamydia panel negative.  HIV negative. RPR is negative.  IM Rocephin given.  UA i s negative   GERD: STABLE.    S/p Gastric Bypass: - stable.   Procedures:  none  Consultations:  none  Discharge Exam: Filed Vitals:   01/21/16 2056 01/22/16 0532  BP: 107/78 128/83  Pulse: 87 90  Temp: 98.6 F (37 C) 98.9 F (37.2 C)  Resp: 16 16    General: alert comfortable.  Cardiovascular: s1s2 Respiratory: ctab  Discharge Instructions   Discharge Instructions    Discharge instructions    Complete by:  As directed   Follow up with PCP  In one week          Discharge Medication List as of 01/22/2016  2:06 PM     STOP taking these medications     ibuprofen (ADVIL,MOTRIN) 200 MG tablet      docusate sodium (COLACE) 100 MG capsule      ondansetron (ZOFRAN) 4 MG tablet      oxyCODONE-acetaminophen (PERCOCET) 5-325 MG per tablet        Allergies  Allergen Reactions  . Adhesive [Tape] Other (See Comments)    TEARS SKIN  . Hydrocodone Nausea Only    HALLUCINATIONS  . Ciprofloxacin Hives and Itching  . Sulfa Antibiotics Itching   Follow-up Information    Follow up with Delbert Harness, MD. Schedule an appointment as soon as possible for a visit in 1 week.   Specialty:  Family Medicine   Contact information:   [address] [place] [address] [phone]        The results of significant diagnostics from this hospitalization (including imaging, microbiology, ancillary and laboratory) are listed below for reference.    Significant Diagnostic Studies: Ct Abdomen Pelvis W Contrast  01/20/2016  CLINICAL DATA:  Lower abdominal pain for 1 week. Nausea, vomiting, and diarrhea for 2 days. No bowel movements for 5 days. Recent antibiotics for kidney infection. Elevated white cell count. Elevated lipase. Multiple abdominal surgeries. EXAM: CT ABDOMEN AND PELVIS WITH CONTRAST TECHNIQUE: Multidetector CT imaging of the abdomen and pelvis was performed using the standard protocol following bolus administration of intravenous contrast. CONTRAST:  ISOVUE-300 IOPAMIDOL (ISOVUE-300) INJECTION 61% COMPARISON:  None. FINDINGS: Minimal dependent changes in  the lung bases. Bilateral breast implants. Diffuse fatty infiltration of the liver. Surgical absence of the gallbladder. No bile duct dilatations. The pancreas, spleen, adrenal glands, kidneys, abdominal aorta, inferior vena cava, and retroperitoneal lymph nodes are unremarkable. The postoperative changes consistent with gastric bypass. Stomach, small bowel, and colon are not abnormally distended. Scattered stool throughout the colon.  Air-fluid levels of the transverse colon associated with liquid stool. No free air or free fluid in the abdomen. Pelvis: Surgical absence of the appendix. Surgical absence of the uterus. No pelvic mass or lymphadenopathy. No free or loculated pelvic fluid collections. Bladder wall is not thickened. Mild degenerative changes in the spine. No destructive bone lesions. IMPRESSION: Diffuse fatty infiltration of the liver. Postoperative changes consistent with gastric bypass. No small or large bowel distention to suggest obstruction. No bowel wall thickening or inflammatory changes. Electronically Signed   By: Burman Nieves M.D.   On: 01/20/2016 02:57    Microbiology: Recent Results (from the past 240 hour(s))  Urine culture     Status: Abnormal   Collection Time: 01/20/16  1:18 AM  Result Value Ref Range Status   Specimen Description URINE, CLEAN CATCH  Final   Special Requests NONE  Final   Culture (A)  Final    <10,000 COLONIES/mL INSIGNIFICANT GROWTH Performed at [facility]    Report Status 01/21/2016 FINAL  Final  Wet prep, genital     Status: None   Collection Time: 01/20/16  3:55 AM  Result Value Ref Range Status   Yeast Wet Prep HPF POC NONE SEEN NONE SEEN Final   Trich, Wet Prep NONE SEEN NONE SEEN Final   Clue Cells Wet Prep HPF POC NONE SEEN NONE SEEN Final   WBC, Wet Prep HPF POC NONE SEEN NONE SEEN Final   Sperm NONE SEEN  Final  Culture, blood (routine x 2)     Status: None   Collection Time: 01/20/16  4:56 AM  Result Value Ref Range Status   Specimen Description BLOOD RIGHT ANTECUBITAL  Final   Special Requests BOTTLES DRAWN AEROBIC AND ANAEROBIC  Final   Culture   Final    NO GROWTH 5 DAYS Performed at [facility]    Report Status 01/25/2016 FINAL  Final  Culture, blood (routine x 2)     Status: None   Collection Time: 01/20/16  7:57 AM  Result Value Ref Range Status   Specimen Description BLOOD RIGHT ARM  Final   Special Requests IN PEDIATRIC  BOTTLE 2CC  Final   Culture   Final    NO GROWTH 5 DAYS Performed at [facility]    Report Status 01/25/2016 FINAL  Final     Labs: Basic Metabolic Panel:  Recent Labs Lab 01/22/16 1134  NA 134*  K 3.3*  CL 109  CO2 21*  GLUCOSE 92  BUN 8  CREATININE 0.66  CALCIUM 8.2*   Liver Function Tests: No results for input(s): AST, ALT, ALKPHOS, BILITOT, PROT, ALBUMIN in the last 168 hours. No results for input(s): LIPASE, AMYLASE in the last 168 hours. No results for input(s): AMMONIA in the last 168 hours. CBC: No results for input(s): WBC, NEUTROABS, HGB, HCT, MCV, PLT in the last 168 hours. Cardiac Enzymes: No results for input(s): CKTOTAL, CKMB, CKMBINDEX, TROPONINI in the last 168 hours. BNP: BNP (last 3 results) No results for input(s): BNP in the last 8760 hours.  ProBNP (last 3 results) No results for input(s): PROBNP in the last  8760 hours.  CBG: No results for input(s): GLUCAP in the last 168 hours.     SignedKathlen Mody:  Aarilyn Dye MD.  [facility] 01/29/2016, 9:47 AM

## 2016-03-20 ENCOUNTER — Observation Stay (HOSPITAL_COMMUNITY)
Admission: EM | Admit: 2016-03-20 | Discharge: 2016-03-21 | Disposition: A | Discharge status: Home / Self Care | Payer: Self-pay | Attending: Internal Medicine | Admitting: Internal Medicine

## 2016-03-20 ENCOUNTER — Emergency Department (HOSPITAL_COMMUNITY): Payer: Self-pay

## 2016-03-20 ENCOUNTER — Encounter (HOSPITAL_COMMUNITY): Payer: Self-pay | Admitting: Emergency Medicine

## 2016-03-20 DIAGNOSIS — E872 Acidosis: Secondary | ICD-10-CM | POA: Insufficient documentation

## 2016-03-20 DIAGNOSIS — K2970 Gastritis, unspecified, without bleeding: Principal | ICD-10-CM | POA: Diagnosis present

## 2016-03-20 DIAGNOSIS — F329 Major depressive disorder, single episode, unspecified: Secondary | ICD-10-CM | POA: Insufficient documentation

## 2016-03-20 DIAGNOSIS — K859 Acute pancreatitis without necrosis or infection, unspecified: Secondary | ICD-10-CM

## 2016-03-20 DIAGNOSIS — E876 Hypokalemia: Secondary | ICD-10-CM | POA: Diagnosis present

## 2016-03-20 DIAGNOSIS — Z87891 Personal history of nicotine dependence: Secondary | ICD-10-CM | POA: Insufficient documentation

## 2016-03-20 DIAGNOSIS — E871 Hypo-osmolality and hyponatremia: Secondary | ICD-10-CM | POA: Diagnosis present

## 2016-03-20 DIAGNOSIS — K2920 Alcoholic gastritis without bleeding: Secondary | ICD-10-CM

## 2016-03-20 DIAGNOSIS — F1010 Alcohol abuse, uncomplicated: Secondary | ICD-10-CM | POA: Diagnosis present

## 2016-03-20 DIAGNOSIS — K219 Gastro-esophageal reflux disease without esophagitis: Secondary | ICD-10-CM | POA: Insufficient documentation

## 2016-03-20 DIAGNOSIS — R079 Chest pain, unspecified: Secondary | ICD-10-CM | POA: Diagnosis present

## 2016-03-20 DIAGNOSIS — F419 Anxiety disorder, unspecified: Secondary | ICD-10-CM | POA: Insufficient documentation

## 2016-03-20 DIAGNOSIS — Y907 Blood alcohol level of 200-239 mg/100 ml: Secondary | ICD-10-CM | POA: Insufficient documentation

## 2016-03-20 DIAGNOSIS — F10129 Alcohol abuse with intoxication, unspecified: Secondary | ICD-10-CM

## 2016-03-20 DIAGNOSIS — Z9884 Bariatric surgery status: Secondary | ICD-10-CM

## 2016-03-20 DIAGNOSIS — F32A Depression, unspecified: Secondary | ICD-10-CM | POA: Diagnosis present

## 2016-03-20 DIAGNOSIS — E8729 Other acidosis: Secondary | ICD-10-CM | POA: Diagnosis present

## 2016-03-20 DIAGNOSIS — R748 Abnormal levels of other serum enzymes: Secondary | ICD-10-CM | POA: Insufficient documentation

## 2016-03-20 HISTORY — DX: Headache, unspecified: R51.9

## 2016-03-20 HISTORY — DX: Iron deficiency anemia, unspecified: D50.9

## 2016-03-20 HISTORY — DX: Pneumonia, unspecified organism: J18.9

## 2016-03-20 HISTORY — DX: Hypothyroidism, unspecified: E03.9

## 2016-03-20 HISTORY — DX: Headache: R51

## 2016-03-20 LAB — RAPID URINE DRUG SCREEN, HOSP PERFORMED
Amphetamines: NOT DETECTED
BARBITURATES: NOT DETECTED
Benzodiazepines: NOT DETECTED
COCAINE: NOT DETECTED
Opiates: NOT DETECTED
Tetrahydrocannabinol: NOT DETECTED

## 2016-03-20 LAB — COMPREHENSIVE METABOLIC PANEL
ALBUMIN: 4.5 g/dL (ref 3.5–5.0)
ALK PHOS: 79 U/L (ref 38–126)
ALT: 22 U/L (ref 14–54)
AST: 33 U/L (ref 15–41)
Anion gap: 23 — ABNORMAL HIGH (ref 5–15)
BUN: <5 mg/dL — ABNORMAL LOW (ref 6–20)
CALCIUM: 9.2 mg/dL (ref 8.9–10.3)
CO2: 17 mmol/L — AB (ref 22–32)
CREATININE: 0.78 mg/dL (ref 0.44–1.00)
Chloride: 90 mmol/L — ABNORMAL LOW (ref 101–111)
GFR calc Af Amer: >60 mL/min (ref >60)
GFR calc non Af Amer: >60 mL/min (ref >60)
GLUCOSE: 131 mg/dL — AB (ref 65–99)
Potassium: 2.9 mmol/L — ABNORMAL LOW (ref 3.5–5.1)
SODIUM: 130 mmol/L — AB (ref 135–145)
Total Bilirubin: 0.4 mg/dL (ref 0.3–1.2)
Total Protein: 7.4 g/dL (ref 6.5–8.1)

## 2016-03-20 LAB — URINALYSIS, ROUTINE W REFLEX MICROSCOPIC
BILIRUBIN URINE: NEGATIVE
GLUCOSE, UA: 100 mg/dL — AB
Hgb urine dipstick: NEGATIVE
KETONES UR: 15 mg/dL — AB
LEUKOCYTES UA: NEGATIVE
NITRITE: NEGATIVE
PH: 6 (ref 5.0–8.0)
PROTEIN: 30 mg/dL — AB
Specific Gravity, Urine: 1.013 (ref 1.005–1.030)

## 2016-03-20 LAB — CBC
HEMATOCRIT: 42.2 % (ref 36.0–46.0)
HEMOGLOBIN: 15.1 g/dL — AB (ref 12.0–15.0)
MCH: 32.2 pg (ref 26.0–34.0)
MCHC: 35.8 g/dL (ref 30.0–36.0)
MCV: 90 fL (ref 78.0–100.0)
Platelets: 239 10*3/uL (ref 150–400)
RBC: 4.69 MIL/uL (ref 3.87–5.11)
RDW: 13 % (ref 11.5–15.5)
WBC: 7 10*3/uL (ref 4.0–10.5)

## 2016-03-20 LAB — URINE MICROSCOPIC-ADD ON

## 2016-03-20 LAB — TROPONIN I: Troponin I: <0.03 ng/mL (ref <0.03)

## 2016-03-20 LAB — LIPASE, BLOOD
Lipase: 105 U/L — ABNORMAL HIGH (ref 11–51)
Lipase: 103 U/L — ABNORMAL HIGH (ref 11–51)

## 2016-03-20 LAB — I-STAT TROPONIN, ED: Troponin i, poc: 0 ng/mL (ref 0.00–0.08)

## 2016-03-20 LAB — ETHANOL: Alcohol, Ethyl (B): 203 mg/dL — ABNORMAL HIGH (ref <5)

## 2016-03-20 LAB — MAGNESIUM: Magnesium: 2.1 mg/dL (ref 1.7–2.4)

## 2016-03-20 MED ORDER — THIAMINE HCL 100 MG/ML IJ SOLN
100.0000 mg | Freq: Once | INTRAMUSCULAR | Status: AC
  Administered 2016-03-20: 100 mg via INTRAVENOUS
  Filled 2016-03-20: qty 2

## 2016-03-20 MED ORDER — PANTOPRAZOLE SODIUM 40 MG PO TBEC
40.0000 mg | DELAYED_RELEASE_TABLET | Freq: Every day | ORAL | Status: DC
  Administered 2016-03-20 – 2016-03-21 (×2): 40 mg via ORAL
  Filled 2016-03-20 (×2): qty 1

## 2016-03-20 MED ORDER — LORAZEPAM 2 MG/ML IJ SOLN
1.0000 mg | Freq: Once | INTRAMUSCULAR | Status: AC
  Administered 2016-03-20: 1 mg via INTRAVENOUS
  Filled 2016-03-20: qty 1

## 2016-03-20 MED ORDER — POTASSIUM CHLORIDE CRYS ER 20 MEQ PO TBCR
40.0000 meq | EXTENDED_RELEASE_TABLET | Freq: Once | ORAL | Status: AC
  Administered 2016-03-20: 40 meq via ORAL
  Filled 2016-03-20: qty 2

## 2016-03-20 MED ORDER — FAMOTIDINE IN NACL 20-0.9 MG/50ML-% IV SOLN
20.0000 mg | Freq: Once | INTRAVENOUS | Status: AC
  Administered 2016-03-20: 20 mg via INTRAVENOUS
  Filled 2016-03-20: qty 50

## 2016-03-20 MED ORDER — IOPAMIDOL (ISOVUE-370) INJECTION 76%
INTRAVENOUS | Status: AC
  Administered 2016-03-20: 100 mL
  Filled 2016-03-20: qty 100

## 2016-03-20 MED ORDER — GI COCKTAIL ~~LOC~~
30.0000 mL | Freq: Three times a day (TID) | ORAL | Status: DC | PRN
  Administered 2016-03-20 – 2016-03-21 (×3): 30 mL via ORAL
  Filled 2016-03-20 (×3): qty 30

## 2016-03-20 MED ORDER — LORAZEPAM 1 MG PO TABS
1.0000 mg | ORAL_TABLET | Freq: Four times a day (QID) | ORAL | Status: DC | PRN
  Administered 2016-03-20: 1 mg via ORAL
  Filled 2016-03-20: qty 1

## 2016-03-20 MED ORDER — SODIUM CHLORIDE 0.9 % IV BOLUS (SEPSIS)
1000.0000 mL | Freq: Once | INTRAVENOUS | Status: AC
  Administered 2016-03-20: 1000 mL via INTRAVENOUS

## 2016-03-20 MED ORDER — SODIUM CHLORIDE 0.9 % IV BOLUS (SEPSIS)
500.0000 mL | Freq: Once | INTRAVENOUS | Status: AC
  Administered 2016-03-20: 500 mL via INTRAVENOUS

## 2016-03-20 MED ORDER — DEXTROSE-NACL 5-0.9 % IV SOLN
INTRAVENOUS | Status: DC
  Administered 2016-03-20: 17:00:00 via INTRAVENOUS

## 2016-03-20 MED ORDER — FOLIC ACID 1 MG PO TABS
1.0000 mg | ORAL_TABLET | Freq: Every day | ORAL | Status: DC
  Administered 2016-03-20 – 2016-03-21 (×2): 1 mg via ORAL
  Filled 2016-03-20 (×2): qty 1

## 2016-03-20 MED ORDER — SODIUM CHLORIDE 0.9% FLUSH
3.0000 mL | Freq: Two times a day (BID) | INTRAVENOUS | Status: DC
  Administered 2016-03-20 – 2016-03-21 (×2): 3 mL via INTRAVENOUS

## 2016-03-20 MED ORDER — DIPHENOXYLATE-ATROPINE 2.5-0.025 MG PO TABS: 1.0000 | ORAL_TABLET | Freq: Four times a day (QID) | ORAL | Status: DC | PRN

## 2016-03-20 MED ORDER — SODIUM CHLORIDE 0.9 % IV SOLN
INTRAVENOUS | Status: DC
Start: 2016-03-20 — End: 2016-03-20
  Administered 2016-03-20: 13:00:00 via INTRAVENOUS

## 2016-03-20 MED ORDER — LORAZEPAM 2 MG/ML IJ SOLN
1.0000 mg | Freq: Once | INTRAMUSCULAR | Status: AC
Start: 2016-03-20 — End: 2016-03-20
  Administered 2016-03-20: 1 mg via INTRAVENOUS
  Filled 2016-03-20: qty 1

## 2016-03-20 MED ORDER — ACETAMINOPHEN 325 MG PO TABS
650.0000 mg | ORAL_TABLET | Freq: Four times a day (QID) | ORAL | Status: DC | PRN
  Administered 2016-03-20: 650 mg via ORAL
  Filled 2016-03-20: qty 2

## 2016-03-20 MED ORDER — LORAZEPAM 2 MG/ML IJ SOLN
1.0000 mg | Freq: Four times a day (QID) | INTRAMUSCULAR | Status: DC | PRN
  Administered 2016-03-20: 1 mg via INTRAVENOUS
  Filled 2016-03-20: qty 1

## 2016-03-20 MED ORDER — ACETAMINOPHEN 650 MG RE SUPP: 650.0000 mg | Freq: Four times a day (QID) | RECTAL | Status: DC | PRN

## 2016-03-20 MED ORDER — VITAMIN B-1 100 MG PO TABS
100.0000 mg | ORAL_TABLET | Freq: Every day | ORAL | Status: DC
  Administered 2016-03-21: 100 mg via ORAL
  Filled 2016-03-20: qty 1

## 2016-03-20 MED ORDER — GI COCKTAIL ~~LOC~~
30.0000 mL | Freq: Once | ORAL | Status: AC
  Administered 2016-03-20: 30 mL via ORAL
  Filled 2016-03-20: qty 30

## 2016-03-20 MED ORDER — ADULT MULTIVITAMIN W/MINERALS CH
1.0000 | ORAL_TABLET | Freq: Every day | ORAL | Status: DC
  Administered 2016-03-20 – 2016-03-21 (×2): 1 via ORAL
  Filled 2016-03-20 (×2): qty 1

## 2016-03-20 MED ORDER — TRAZODONE HCL 50 MG PO TABS
50.0000 mg | ORAL_TABLET | Freq: Once | ORAL | Status: AC
  Administered 2016-03-20: 50 mg via ORAL
  Filled 2016-03-20: qty 1

## 2016-03-20 MED ORDER — FLUOXETINE HCL 20 MG PO CAPS
20.0000 mg | ORAL_CAPSULE | Freq: Every day | ORAL | Status: DC
  Administered 2016-03-20 – 2016-03-21 (×2): 20 mg via ORAL
  Filled 2016-03-20 (×2): qty 1

## 2016-03-20 MED ORDER — ENOXAPARIN SODIUM 40 MG/0.4ML ~~LOC~~ SOLN
40.0000 mg | SUBCUTANEOUS | Status: DC
  Administered 2016-03-20: 40 mg via SUBCUTANEOUS
  Filled 2016-03-20: qty 0.4

## 2016-03-20 MED ORDER — ONDANSETRON HCL 4 MG PO TABS
4.0000 mg | ORAL_TABLET | Freq: Four times a day (QID) | ORAL | Status: DC | PRN
  Administered 2016-03-21: 4 mg via ORAL
  Filled 2016-03-20: qty 1

## 2016-03-20 MED ORDER — ONDANSETRON HCL 4 MG/2ML IJ SOLN: 4.0000 mg | Freq: Four times a day (QID) | INTRAMUSCULAR | Status: DC | PRN

## 2016-03-20 MED ORDER — METOCLOPRAMIDE HCL 5 MG/ML IJ SOLN
10.0000 mg | Freq: Once | INTRAMUSCULAR | Status: AC
  Administered 2016-03-20: 10 mg via INTRAVENOUS
  Filled 2016-03-20: qty 2

## 2016-03-20 NOTE — ED Notes (Signed)
MD at bedside. 
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
## 2016-03-20 NOTE — H&P (Signed)
Date: 03/20/2016               Patient Name:  Pearle Wandler MRN: [id number]  DOB: 12-13-1975 Age / Sex: 40 y.o., female   PCP: Macy Mis, MD         Medical Service: Internal Medicine Teaching Service         Attending Physician: Dr. Doneen Poisson, MD    First Contact: Dr. Antony Contras Pager: [phone]  Second Contact: Dr. Isabella Bowens Pager: [phone]       After Hours (After 5p/  First Contact Pager: [phone]  weekends / holidays): Second Contact Pager: [phone]   Chief Complaint: "my chest is hurting"  History of Present Illness: Ms. Budnick is a 40 yo female with PMHx of alcohol abuse, tobacco abuse, obesity s/p gastric bypass in 2009 and depression who presents to the ED with complaint of chest pain.   Patient states that for the past 8 days she has been binge drinking tequila- 2 pints per day for 8 days. She admits to chronic underlying stress and depression, but she has had increased social stressors at home for the past 2 months. Due to this, she started binge drinking again. Patient has a history of binge drinking, stating she would being drink every 3 to 4 months ever since December 2015. She has not drank any alcohol is the past 3 months. She knows that she cannot control the amount she drinks. For the past 8 days, she has laid in bed, drinking, and sleeping. She admits to "blacking out and passing out" several times in the last few days.  Last night, patient was laying in bed when she had a sudden onset of left sided chest pain radiating to her left arm and left side of her neck. Pain was a 10/10, pressure like, worse with inspiration and movement, better with sitting up. The pain was associated with shortness of breath. She admits to nausea and diaphoresis; however, she has been nauseous for the past several days and she is intermittently diaphoretic from hot flashes which is chronic for her. She has not vomited. She admits epigastric pain and burning to loose, mucous like stools  for the past 4 days, 4-5 bowel movements per day. She had a formed stool this morning. She denies any history of heart disease, HTN, HLD, history of chest pain or DOE. She denies any recent travel.   On presentation to the ED, patient was afebrile, tachycardic to the 140s, RR 24, satting 99% on room air. Sodium 130, potassium 2.9, Cl 90, Bicarb 17, BUN 5, creatining 0.78, glucose 131 with an anion gap of 23. CBC normal. Troponin negative. Magnesium 2.1. LFTs normal. UA with ketones and glucose. UDS negative. Alcohol level 203. Acute chest and abdomen normal. Given concern for PE, CTA chest was obtained which was normal. Patient was given a GI cocktail, reglan, pepcid and a 2L NS bolus in the ED. Given her ongoing tachycardia despite IVF, IMTS was called for admission.  Currently, patient states chest pain is left sided and non-radiating, improved to a 5/10. She continues to have nausea, epigastric burning. She admits to feeling depressed and frustrated with her problems at home with her two children ages 35, and 64. She is interested in an antidepressant.   Meds: Current Facility-Administered Medications  Medication Dose Route Frequency Provider Last Rate Last Dose  . acetaminophen (TYLENOL) tablet 650 mg  650 mg Oral Q6H PRN Servando Snare, MD  Or  . acetaminophen (TYLENOL) suppository 650 mg  650 mg Rectal Q6H PRN Dannelle Rhymes R Elese Rane, MD      . dextrose 5 %-0.9 % sodium chloride infusion   Intravenous Continuous Phillp Dolores Lucrezia Starch, MD 150 mL/hr at 03/20/16 1636    . diphenoxylate-atropine (LOMOTIL) 2.5-0.025 MG per tablet 1 tablet  1 tablet Oral QID PRN Kennede Lusk Lucrezia Starch, MD      . enoxaparin (LOVENOX) injection 40 mg  40 mg Subcutaneous Q24H Rayden Dock R Coltan Spinello, MD   40 mg at 03/20/16 1310  . FLUoxetine (PROZAC) capsule 20 mg  20 mg Oral Daily Marca Gadsby R Lawerance Bach, MD   20 mg at 03/20/16 1311  . folic acid (FOLVITE) tablet 1 mg  1 mg Oral Daily Jett Kulzer R Joven Mom, MD   1 mg at 03/20/16 1311  . gi cocktail  (Maalox,Lidocaine,Donnatal)  30 mL Oral TID PRN Rmani Kellogg Lucrezia Starch, MD   30 mL at 03/20/16 1311  . LORazepam (ATIVAN) tablet 1 mg  1 mg Oral Q6H PRN Servando Snare, MD   1 mg at 03/20/16 1422   Or  . LORazepam (ATIVAN) injection 1 mg  1 mg Intravenous Q6H PRN Konor Noren Lucrezia Starch, MD      . multivitamin with minerals tablet 1 tablet  1 tablet Oral Daily Mikhayla Phillis Lucrezia Starch, MD   1 tablet at 03/20/16 1311  . ondansetron (ZOFRAN) tablet 4 mg  4 mg Oral Q6H PRN Emilyrose Darrah Lucrezia Starch, MD       Or  . ondansetron (ZOFRAN) injection 4 mg  4 mg Intravenous Q6H PRN Umberto Pavek R Rakel Junio, MD      . pantoprazole (PROTONIX) EC tablet 40 mg  40 mg Oral Daily Taygan Connell R Lawerance Bach, MD   40 mg at 03/20/16 1311  . sodium chloride flush (NS) 0.9 % injection 3 mL  3 mL Intravenous Q12H Amilyah Nack Lucrezia Starch, MD   3 mL at 03/20/16 1313  . [START ON 03/21/2016] thiamine (VITAMIN B-1) tablet 100 mg  100 mg Oral Daily Marquette Piontek Lucrezia Starch, MD        Allergies: Allergies as of 03/20/2016 - Review Complete 03/20/2016  Allergen Reaction Noted  . Adhesive [tape] Other (See Comments) 04/12/2015  . Hydrocodone Nausea Only 04/12/2015  . Sulfa antibiotics Itching 04/10/2012   Past Medical History  Diagnosis Date  . GERD (gastroesophageal reflux disease)   . Complication of anesthesia     hypotension with breast augmentation  . Migraines     occasional  . Sciatic pain 04/12/2015    left  . Bimalleolar fracture of left ankle 04/2015    Family History:  Mother: COPD, DM, Depression, Anxiety Father: Asthma, T2DM  Social History:  Tobacco Use: 1 pack per week for 20 years Alcohol Use: Binge drinker- 2 pints per day for the past 8 days. Binges every 3-4 months.  Illicit Drug Use: Denies Occupation: Charity fundraiser, currently not working Daughter age 88, Son age 75  Surgical History: Gastric Bypass 2009 Cholecystecomty 2006 Appendectomy 1986 "Tummy Tuck" B/l Breast augmentation Hysterectomy  Review of Systems: A complete ROS was negative except as per HPI.   Physical  Exam: Filed Vitals:   03/20/16 1145 03/20/16 1215 03/20/16 1241 03/20/16 1505  BP: 132/78 131/81 133/80 125/69  Pulse: 105 107 114 126  Temp:   98.5 F (36.9 C) 98.8 F (37.1 C)  TempSrc:   Oral Oral  Resp: Height:      Weight:      SpO2:  96% 98% 100% 94%   General: Vital signs reviewed.  Patient is well-developed and well-nourished, in no acute distress and cooperative with exam.  Head: Normocephalic and atraumatic. Eyes: PERRLA, no scleral icterus.  Neck: Supple, trachea midline, no JVD, no carotid bruit present.  Cardiovascular: Tachycardic, regular rhythm, S1 normal, S2 normal, no murmurs, gallops, or rubs. Pulmonary/Chest: Clear to auscultation bilaterally, no wheezes, rales, or rhonchi. Abdominal: Soft, mildly tender in the epigastric region, non-distended, BS +, no masses, organomegaly, or guarding present.  Extremities: No lower extremity edema bilaterally, pulses symmetric and intact bilaterally.  Neurological: A&O x3, answers questions appropriately, moving all extremities.  Skin: Warm, dry and intact. No rashes or erythema. Multiple tattoos.  Psychiatric: Pleasant. Normal mood and affect. speech and behavior is normal. Cognition and memory are normal.   EKG: None  CTA Chest: FINDINGS: Cardiovascular: Aorta normal caliber without aneurysm or dissection. Pulmonary arteries well opacified and patent. No evidence of pulmonary embolism. No pericardial effusion. Mediastinum/Nodes: Few normal size mediastinal lymph nodes without thoracic adenopathy. Base of cervical region normal appearance. Lungs/Pleura: Lungs clear. No pleural effusion or pneumothorax. Upper Abdomen: Post cholecystectomy. Post gastric bypass surgery. Musculoskeletal: No acute osseous findings. BILATERAL breast prostheses. Review of the MIP images confirms the above findings. IMPRESSION: No evidence of pulmonary embolism. No acute intra thoracic abnormalities.  Acute Abd Xray w/ Chest:   FINDINGS: The lungs are clear. No pleural effusion or pneumothorax. The cardiac silhouette is within normal limits. No acute osseous pathology. Multiple surgical clips as well as anastomotic suture in the left hemi abdomen. No bowel dilatation or evidence of obstruction. Air is noted within the transverse and sigmoid bone. No free air. No radiopaque calculi. The soft tissues and osseous structures appear unremarkable. IMPRESSION: No acute cardiopulmonary process. No evidence of bowel obstruction.  Assessment & Plan by Problem: Principal Problem:   Alcoholic ketoacidosis Active Problems:   Hypokalemia   Hyponatremia   Chest pain   Alcohol consumption binge drinking   Depression   Gastritis   History of gastric bypass  Ms. Worthy is a 40 yo female with PMHx of alcohol abuse, tobacco abuse, obesity s/p gastric bypass in 2009 and depression who presents to the ED with complaint of chest pain, epigastric pain, and nausea after 8 days of binge drinking.  Alcoholic Ketoacidosis: Patient presents after an 8 day binge drinking episode with 2 pints of tequila per day due to recent stressors at home and underlying dementia. She presented with tachycardia, urine with ketones and alcohol level of 203. Anion gap of 23. CIWA 5. Patient received 2L of NS in the ED with improvement of her tachycardia from 140s to 110s. Patient is at higher risk of alcohol dependency, intoxication and electrolyte disturbances given her gastric bypass in 2009.  -Admit to observation telemetry -Continue CIWA -Counseled on alcohol abuse -Treat depression as below -IVF, thiamine, folate -Repeat BMET tomorrow am  Chest Pain: Patient presents with left sided chest pain radiating to left arm and neck and associated with shortness of breath and nausea. Pain has improved from a 10 to a 5. No history of heart disease. No known history of HTN, DM, HLD. Troponin negative. EKG was not obtained in the ED. Given history, CTA  Chest was obtained for PE which was negative. Differential include cardiac- ACS (will obtain EKG and trend troponins), gastritis/pancreatitis (given nausea, epigastric pain and alcohol use), pericarditis (given pleurisy and relief sitting forward, will obtain EKG), early aspiration pneumonia (doubt as no clinical symptoms and  lack of findings on CT; however, recent heavy alcohol use), GERD, or anxiety.  -EKG  -Telemetry -Trend troponins -GI Cocktail prn -Zofran prn  Likely Gastritis: Given epigastric pain and nausea, patient likely has developed gastritis from not eating and heavy alcohol use. She denies history of PUD. She is at increased risk due to gastric bypass surgery in the past. She states GI cocktails have helped. Acute abdomen was negative for acute abnormality. Will check lipase for pancreatitis. -Lipase -GI cocktail -Zofran prn -Protonix 40 mg daily -Avoid alcohol use and NSAIDs  Addendum: Lipase 104, however also elevated in May, could be chronic pancreatitis; however, no calcifications are seen on abdominal xray. CTA chest captures the liver and there are possible calcifications. The pancreas does not appear to be enlarged or to have heterogenous enhancement.  Hypokalemia: Potassium 2.9 on admission. 2/2 Alcoholic Ketoacidosis. Patient received KDur 40 mEq once in the ED.  -Repeat BMET tomorrow am -KDur 40 mEq once  Hyponatremia: Sodium 130 on admission. Likely due to dehydration and alcohol use.  -IVF -Repeat BMET tomorrow am  Moderate Depression: Patient scored a 10 on the PHQ-9 scale. She denies SI/HI. She is open to taking medications as long as they are affordable as she does not have insurance. Will start Fluoxetine as this is available on the $4 list at [facility]. Unfortunately, I do not think Bupropion is available for $4 as this could help with smoking cessation.  -Initiate Fluoxetine 20 mg daily -Follow up with PCP for titration, consideration of bupropion  DVT/PE  ppx: Lovenox SQ QD FEN: Regular CODE: FULL  Dispo: Admit patient to Observation with expected length of stay less than 2 midnights.  Signed: Karlene LinemanAlexa Hadasah Brugger, DO PGY-3 Internal Medicine Resident Pager # [phone] 03/20/2016 4:45 PM

## 2016-03-20 NOTE — ED Provider Notes (Signed)
Patient signed out to me by Dr. Oletta Cohnpolina and CT of the chest negative for PE. Patient still tachycardic here. Treated with Ativan. Does have increased anion gap. She was hydrated will require admission for observation  Lorre NickAnthony Eulises Kijowski, MD 03/20/16 [phone]

## 2016-03-20 NOTE — Progress Notes (Addendum)
Patient arrived on the unit from the ED, assessment completed see flowsheet, patient oriented to room and staff, they verbalised understanding plcaed on tele, ccmd notified, bed in lowest position ,call light within reach, will continue to monitor  
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
## 2016-03-21 DIAGNOSIS — F329 Major depressive disorder, single episode, unspecified: Secondary | ICD-10-CM

## 2016-03-21 DIAGNOSIS — Z9884 Bariatric surgery status: Secondary | ICD-10-CM

## 2016-03-21 DIAGNOSIS — F1010 Alcohol abuse, uncomplicated: Secondary | ICD-10-CM

## 2016-03-21 DIAGNOSIS — K2970 Gastritis, unspecified, without bleeding: Principal | ICD-10-CM

## 2016-03-21 DIAGNOSIS — E872 Acidosis: Secondary | ICD-10-CM

## 2016-03-21 LAB — CBC
HEMATOCRIT: 32.9 % — AB (ref 36.0–46.0)
HEMOGLOBIN: 11.4 g/dL — AB (ref 12.0–15.0)
MCH: 32.4 pg (ref 26.0–34.0)
MCHC: 34.7 g/dL (ref 30.0–36.0)
MCV: 93.5 fL (ref 78.0–100.0)
Platelets: 138 10*3/uL — ABNORMAL LOW (ref 150–400)
RBC: 3.52 MIL/uL — AB (ref 3.87–5.11)
RDW: 13.6 % (ref 11.5–15.5)
WBC: 4.7 10*3/uL (ref 4.0–10.5)

## 2016-03-21 LAB — BASIC METABOLIC PANEL
ANION GAP: 10 (ref 5–15)
BUN: <5 mg/dL — ABNORMAL LOW (ref 6–20)
CHLORIDE: 102 mmol/L (ref 101–111)
CO2: 27 mmol/L (ref 22–32)
Calcium: 9 mg/dL (ref 8.9–10.3)
Creatinine, Ser: 0.72 mg/dL (ref 0.44–1.00)
Glucose, Bld: 107 mg/dL — ABNORMAL HIGH (ref 65–99)
POTASSIUM: 3.4 mmol/L — AB (ref 3.5–5.1)
SODIUM: 139 mmol/L (ref 135–145)

## 2016-03-21 MED ORDER — PANTOPRAZOLE SODIUM 40 MG PO TBEC
40.0000 mg | DELAYED_RELEASE_TABLET | Freq: Every day | ORAL | Status: DC
Start: 2016-03-21 — End: 2016-05-21

## 2016-03-21 MED ORDER — FLUOXETINE HCL 20 MG PO CAPS: 20.0000 mg | ORAL_CAPSULE | Freq: Every day | ORAL | Status: DC

## 2016-03-21 MED ORDER — PHENOL 1.4 % MT LIQD
1.0000 | OROMUCOSAL | Status: DC | PRN
  Administered 2016-03-21: 1 via OROMUCOSAL
  Filled 2016-03-21: qty 177

## 2016-03-21 MED ORDER — PANTOPRAZOLE SODIUM 40 MG PO TBEC: 40.0000 mg | DELAYED_RELEASE_TABLET | Freq: Every day | ORAL | Status: DC

## 2016-03-21 MED ORDER — POTASSIUM CHLORIDE CRYS ER 20 MEQ PO TBCR
30.0000 meq | EXTENDED_RELEASE_TABLET | Freq: Once | ORAL | Status: AC
  Administered 2016-03-21: 30 meq via ORAL
  Filled 2016-03-21: qty 1

## 2016-03-21 NOTE — Progress Notes (Signed)
   Subjective: Feels better overall this morning. She still has some epigastric pain and nausea but was able to eat her breakfast without emesis.   Objective: Vital signs in last 24 hours: Filed Vitals:   03/20/16 1241 03/20/16 1505 03/20/16 2234 03/21/16 0513  BP: 133/80 125/69 136/91 129/86  Pulse: 114 126 89 82  Temp: 98.5 F (36.9 C) 98.8 F (37.1 C) 98.5 F (36.9 C) 98.2 F (36.8 C)  TempSrc: Oral Oral Oral Oral  Resp: 18 18 18 20   Height:      Weight:      SpO2: 100% 94% 100% 97%   Physical Exam General Apperance: NAD HEENT: Normocephalic, atraumatic, anicteric sclera Neck: Supple, trachea midline Lungs: Clear to auscultation bilaterally. No wheezes, rhonchi or rales. Breathing comfortably Heart: Regular rate and rhythm, no murmur/rub/gallop Abdomen: Soft, nontender, nondistended, no rebound/guarding Extremities: Warm and well perfused, no edema Skin: No rashes or lesions Neurologic: Alert and interactive. No gross deficits.   Assessment/Plan: 40 year old woman with alcohol abuse, tobacco abuse, obesity s/p gastric bypass in 2009 and depression who presented with chest pain, epigastric pain, and nausea after 8 days of binge drinking.  Alcoholic Ketoacidosis: Tachycardia improved this morning. CO2 this morning 27. Resolved. -d/c IV fluids.  Chest Pain: Possible gastritis versus chronic pancreatitis. Troponins were negative x 3 overnight.  -Protonix 40 mg daily -Avoid alcohol use and NSAIDs  Hypokalemia: K improved to 3.4 this morning. -KDur 30 mEq once  Hyponatremia: Resolved.  Moderate Depression: Patient scored a 10 on the PHQ-9 scale.  -Continue Fluoxetine 20 mg daily -Follow up with PCP for titration, consideration of bupropion  VTE ppx: Lovenox SQ QD FEN: Regular CODE: FULL  Dispo: Likely home today.    LOS: 1 day   Lora PaulaJennifer T Robbie Rideaux, MD 03/21/2016, 7:24 AM Pager: [phone]

## 2016-03-21 NOTE — Discharge Summary (Signed)
Name: Julie Conley MRN: [id number] DOB: 1975-09-12 [age] y.o. PCP: Julie MisKim K Briscoe, MD  Date of Admission: 03/20/2016  1:35 AM Date of Discharge: 03/21/2016 Attending Physician: Doneen PoissonLawrence Klima, MD  Discharge Diagnosis: Principal Problem:   Alcoholic ketoacidosis Active Problems:   Alcohol consumption binge drinking   Depression   Gastritis   History of gastric bypass  Discharge Medications:   Medication List    TAKE these medications        FLUoxetine 20 MG capsule  Commonly known as:  PROZAC  Take 1 capsule (20 mg total) by mouth daily.     pantoprazole 40 MG tablet  Commonly known as:  PROTONIX  Take 1 tablet (40 mg total) by mouth daily.        Disposition and follow-up:   [facility].Jul[facility] Con[facility] St[facility].  At the hospital follow up visit please address:  1.  Gastritis: She was started on Protonix 40mg  daily and counseled on alcohol cessation and avoiding use of NSAIDs  Moderate Depression: Patient scored a 10 on the PHQ-9 scale. Follow up with PCP for titration of fluoxetine 20mg  daily, consideration of bupropion.  2.  Labs / imaging needed at time of follow-up: BMP, CBC  3.  Pending labs/ test needing follow-up: None  Follow-up Appointments:     Follow-up Information    Follow up with Julie Conley, KIM, MD. Schedule an appointment as soon as possible for a visit in 2 weeks.   Specialty:  Family Medicine   Why:  For hospital follow up.   Contact information:   [address] [place] [address] [phone]       Hospital Course by problem list:   1. Alcoholic Ketoacidosis: Patient presents after an 8 day binge drinking episode with 2 pints of tequila per day due to recent stressors at home and underlying dementia. She presented with tachycardia, urine with ketones and alcohol level of 203. Anion gap of 23. CIWA 5. Patient received 2L of NS in the ED with improvement of her  tachycardia from 140s to 110s. Patient is at higher risk of alcohol dependency, intoxication and electrolyte disturbances given her gastric bypass in 2009. She was admitted for observation. She was continued on a CIWA protocol and given D5 NS over night. She was also given thiamine and folate. By hospital day 2, her tachycardia had improved and her CO2 improved to 27. Fluids were discontinued. At discharge, she was tolerating PO intake. She was discharged with instructions to follow up with her PCP.  2. Gastritis: Patient presents with left sided chest pain radiating to left arm and neck and associated with shortness of breath and nausea. Pain has improved from a 10 to a 5. Given history, CTA chest was obtained for PE which was negative. Troponins were negative x 3 overnight. Given epigastric pain and nausea, patient likely has developed gastritis from not eating and heavy alcohol use. Acute abdomen was negative for acute abnormality. Lipase was 104 but it was also elevated in May - may represent chronic pancreatitis. Possible calcifications on CTA chest in the pancreas and it does not appear to be enlarged or have heterogenous enhancement. She was started on Protonix 40mg  daily and counseled on alcohol cessation and avoiding use of NSAIDs  3. Moderate Depression: Patient scored a 10 on the PHQ-9 scale. She denies SI/HI. She is open to taking medications as long as they are affordable as she does not have insurance.  She was started on fluoxetine as this is available on the $4 list at [facility]. Follow up with PCP for titration, consideration of bupropion.  Discharge Vitals:   BP 129/86 mmHg  Pulse 82  Temp(Src) 98.2 F (36.8 C) (Oral)  Resp 20  Ht  (1.626 m)  Wt 174 lb (78.926 kg)  BMI 29.85 kg/m2  SpO2 97%  Pertinent Labs, Studies, and Procedures:  BMP Latest Ref Rng 03/21/2016 03/20/2016 01/22/2016  Glucose 65 - 99 mg/dL 413(K) 440(N) 92  BUN 6 - 20 mg/dL <0(U) <7(O) 8  Creatinine 0.44 - 1.00  mg/dL 5.36 6.44 0.34  Sodium 135 - 145 mmol/L 139 130(L) 134(L)  Potassium 3.5 - 5.1 mmol/L 3.4(L) 2.9(L) 3.3(L)  Chloride 101 - 111 mmol/L 102 90(L) 109  CO2 22 - 32 mmol/L 27 17(L) 21(L)  Calcium 8.9 - 10.3 mg/dL 9.0 9.2 7.4(Q)    CBC Latest Ref Rng 03/21/2016 03/20/2016 01/21/2016  WBC 4.0 - 10.5 K/uL 4.7 7.0 11.3(H)  Hemoglobin 12.0 - 15.0 g/dL 11.4(L) 15.1(H) 10.2(L)  Hematocrit 36.0 - 46.0 % 32.9(L) 42.2 27.8(L)  Platelets 150 - 400 K/uL 138(L) 239 266   CT ANGIOGRAPHY CHEST WITH CONTRAST  TECHNIQUE: Multidetector CT imaging of the chest was performed using the standard protocol during bolus administration of intravenous contrast. Multiplanar CT image reconstructions and MIPs were obtained to evaluate the vascular anatomy.  CONTRAST: 100 cc Isovue 370 IV  COMPARISON: None  FINDINGS: Cardiovascular: Aorta normal caliber without aneurysm or dissection. Pulmonary arteries well opacified and patent. No evidence of pulmonary embolism. No pericardial effusion.  Mediastinum/Nodes: Few normal size mediastinal lymph nodes without thoracic adenopathy. Base of cervical region normal appearance.  Lungs/Pleura: Lungs clear. No pleural effusion or pneumothorax.  Upper Abdomen: Post cholecystectomy. Post gastric bypass surgery.  Musculoskeletal: No acute osseous findings. BILATERAL breast prostheses.  Review of the MIP images confirms the above findings.  IMPRESSION: No evidence of pulmonary embolism.  No acute intra thoracic abnormalities.   Discharge Instructions: Discharge Instructions    Call MD for:  difficulty breathing, headache or visual disturbances    Complete by:  As directed      Call MD for:  persistant dizziness or light-headedness    Complete by:  As directed      Call MD for:  persistant nausea and vomiting    Complete by:  As directed      Call MD for:  severe uncontrolled pain    Complete by:  As directed      Call MD for:  temperature  >100.4    Complete by:  As directed      Diet - low sodium heart healthy    Complete by:  As directed      Discharge instructions    Complete by:  As directed   Take Protonix (pantoprazole) for your gastritis. You will need to cut back on alcohol and avoid NSAIDs (ibuprofen, naproxen, aspirin, etc). Take fluoxetine for your depression. Please follow up with your primary care provider in 1-2 weeks.     Increase activity slowly    Complete by:  As directed            Signed: Lora Paula, MD 03/21/2016, 10:44 AM   Pager: [phone]

## 2016-04-24 ENCOUNTER — Observation Stay (HOSPITAL_COMMUNITY)
Admission: EM | Admit: 2016-04-24 | Discharge: 2016-04-26 | Disposition: A | Discharge status: Home / Self Care | Payer: Self-pay | Attending: Internal Medicine | Admitting: Internal Medicine

## 2016-04-24 ENCOUNTER — Encounter (HOSPITAL_COMMUNITY): Payer: Self-pay | Admitting: Oncology

## 2016-04-24 DIAGNOSIS — Z9884 Bariatric surgery status: Secondary | ICD-10-CM | POA: Insufficient documentation

## 2016-04-24 DIAGNOSIS — R000 Tachycardia, unspecified: Secondary | ICD-10-CM

## 2016-04-24 DIAGNOSIS — F10232 Alcohol dependence with withdrawal with perceptual disturbance: Secondary | ICD-10-CM

## 2016-04-24 DIAGNOSIS — F10239 Alcohol dependence with withdrawal, unspecified: Principal | ICD-10-CM | POA: Insufficient documentation

## 2016-04-24 DIAGNOSIS — D509 Iron deficiency anemia, unspecified: Secondary | ICD-10-CM | POA: Insufficient documentation

## 2016-04-24 DIAGNOSIS — F419 Anxiety disorder, unspecified: Secondary | ICD-10-CM | POA: Insufficient documentation

## 2016-04-24 DIAGNOSIS — F10230 Alcohol dependence with withdrawal, uncomplicated: Secondary | ICD-10-CM

## 2016-04-24 DIAGNOSIS — F1010 Alcohol abuse, uncomplicated: Secondary | ICD-10-CM

## 2016-04-24 DIAGNOSIS — F10930 Alcohol use, unspecified with withdrawal, uncomplicated: Secondary | ICD-10-CM | POA: Diagnosis present

## 2016-04-24 DIAGNOSIS — D649 Anemia, unspecified: Secondary | ICD-10-CM

## 2016-04-24 DIAGNOSIS — F329 Major depressive disorder, single episode, unspecified: Secondary | ICD-10-CM | POA: Insufficient documentation

## 2016-04-24 DIAGNOSIS — K219 Gastro-esophageal reflux disease without esophagitis: Secondary | ICD-10-CM | POA: Insufficient documentation

## 2016-04-24 DIAGNOSIS — E861 Hypovolemia: Secondary | ICD-10-CM | POA: Insufficient documentation

## 2016-04-24 DIAGNOSIS — F17210 Nicotine dependence, cigarettes, uncomplicated: Secondary | ICD-10-CM | POA: Insufficient documentation

## 2016-04-24 LAB — URINE MICROSCOPIC-ADD ON

## 2016-04-24 LAB — CBC
HEMATOCRIT: 38.2 % (ref 36.0–46.0)
Hemoglobin: 13.3 g/dL (ref 12.0–15.0)
MCH: 31.5 pg (ref 26.0–34.0)
MCHC: 34.8 g/dL (ref 30.0–36.0)
MCV: 90.5 fL (ref 78.0–100.0)
Platelets: 184 10*3/uL (ref 150–400)
RBC: 4.22 MIL/uL (ref 3.87–5.11)
RDW: 12.9 % (ref 11.5–15.5)
WBC: 7.3 10*3/uL (ref 4.0–10.5)

## 2016-04-24 LAB — RAPID URINE DRUG SCREEN, HOSP PERFORMED
Amphetamines: NOT DETECTED
BARBITURATES: NOT DETECTED
BENZODIAZEPINES: NOT DETECTED
Cocaine: NOT DETECTED
Opiates: NOT DETECTED
Tetrahydrocannabinol: POSITIVE — AB

## 2016-04-24 LAB — COMPREHENSIVE METABOLIC PANEL
ALBUMIN: 4.3 g/dL (ref 3.5–5.0)
ALK PHOS: 89 U/L (ref 38–126)
ALT: 36 U/L (ref 14–54)
AST: 43 U/L — AB (ref 15–41)
Anion gap: 12 (ref 5–15)
BUN: 6 mg/dL (ref 6–20)
CHLORIDE: 96 mmol/L — AB (ref 101–111)
CO2: 27 mmol/L (ref 22–32)
CREATININE: 0.67 mg/dL (ref 0.44–1.00)
Calcium: 8.5 mg/dL — ABNORMAL LOW (ref 8.9–10.3)
GFR calc non Af Amer: >60 mL/min (ref >60)
GLUCOSE: 113 mg/dL — AB (ref 65–99)
Potassium: 3.7 mmol/L (ref 3.5–5.1)
SODIUM: 135 mmol/L (ref 135–145)
Total Bilirubin: 1 mg/dL (ref 0.3–1.2)
Total Protein: 7 g/dL (ref 6.5–8.1)

## 2016-04-24 LAB — URINALYSIS, ROUTINE W REFLEX MICROSCOPIC
BILIRUBIN URINE: NEGATIVE
Glucose, UA: NEGATIVE mg/dL
HGB URINE DIPSTICK: NEGATIVE
Ketones, ur: 40 mg/dL — AB
NITRITE: NEGATIVE
PROTEIN: 30 mg/dL — AB
Specific Gravity, Urine: 1.021 (ref 1.005–1.030)
pH: 6 (ref 5.0–8.0)

## 2016-04-24 LAB — ETHANOL: Alcohol, Ethyl (B): <5 mg/dL (ref <5)

## 2016-04-24 MED ORDER — THIAMINE HCL 100 MG/ML IJ SOLN
100.0000 mg | Freq: Once | INTRAMUSCULAR | Status: AC
Start: 2016-04-24 — End: 2016-04-24
  Administered 2016-04-24: 100 mg via INTRAMUSCULAR
  Filled 2016-04-24: qty 2

## 2016-04-24 MED ORDER — FOLIC ACID 1 MG PO TABS
1.0000 mg | ORAL_TABLET | Freq: Once | ORAL | Status: AC
  Administered 2016-04-24: 1 mg via ORAL
  Filled 2016-04-24: qty 1

## 2016-04-24 MED ORDER — SODIUM CHLORIDE 0.9 % IV BOLUS (SEPSIS)
1000.0000 mL | Freq: Once | INTRAVENOUS | Status: AC
  Administered 2016-04-24: 1000 mL via INTRAVENOUS

## 2016-04-24 MED ORDER — ONDANSETRON HCL 4 MG/2ML IJ SOLN
4.0000 mg | Freq: Four times a day (QID) | INTRAMUSCULAR | Status: DC | PRN
  Administered 2016-04-24 (×2): 4 mg via INTRAVENOUS
  Filled 2016-04-24 (×2): qty 2

## 2016-04-24 MED ORDER — VITAMIN B-1 100 MG PO TABS
100.0000 mg | ORAL_TABLET | Freq: Every day | ORAL | Status: DC
  Administered 2016-04-24: 100 mg via ORAL
  Filled 2016-04-24: qty 1

## 2016-04-24 MED ORDER — CHLORDIAZEPOXIDE HCL 25 MG PO CAPS: 25.0000 mg | ORAL_CAPSULE | Freq: Every day | ORAL | Status: DC

## 2016-04-24 MED ORDER — ONDANSETRON HCL 4 MG/2ML IJ SOLN
4.0000 mg | Freq: Once | INTRAMUSCULAR | Status: AC
  Administered 2016-04-24: 4 mg via INTRAVENOUS
  Filled 2016-04-24: qty 2

## 2016-04-24 MED ORDER — LORAZEPAM 1 MG PO TABS: 1.0000 mg | ORAL_TABLET | Freq: Once | ORAL | Status: DC

## 2016-04-24 MED ORDER — LORAZEPAM 1 MG PO TABS
1.0000 mg | ORAL_TABLET | Freq: Once | ORAL | Status: AC
  Administered 2016-04-24: 1 mg via ORAL
  Filled 2016-04-24: qty 1

## 2016-04-24 MED ORDER — VITAMIN B-1 100 MG PO TABS
100.0000 mg | ORAL_TABLET | Freq: Every day | ORAL | Status: DC
  Administered 2016-04-25 – 2016-04-26 (×2): 100 mg via ORAL
  Filled 2016-04-24 (×2): qty 1

## 2016-04-24 MED ORDER — PANTOPRAZOLE SODIUM 40 MG PO TBEC
40.0000 mg | DELAYED_RELEASE_TABLET | Freq: Every day | ORAL | Status: DC
  Administered 2016-04-24 – 2016-04-26 (×3): 40 mg via ORAL
  Filled 2016-04-24 (×3): qty 1

## 2016-04-24 MED ORDER — DEXTROSE-NACL 5-0.45 % IV SOLN
INTRAVENOUS | Status: DC
  Administered 2016-04-24 (×2): via INTRAVENOUS

## 2016-04-24 MED ORDER — LORAZEPAM 2 MG/ML IJ SOLN
0.0000 mg | Freq: Four times a day (QID) | INTRAMUSCULAR | Status: DC
  Administered 2016-04-24: 1 mg via INTRAVENOUS
  Administered 2016-04-24: 2 mg via INTRAVENOUS
  Filled 2016-04-24 (×2): qty 1

## 2016-04-24 MED ORDER — LORAZEPAM 2 MG/ML IJ SOLN: 0.0000 mg | Freq: Two times a day (BID) | INTRAMUSCULAR | Status: DC

## 2016-04-24 MED ORDER — ENOXAPARIN SODIUM 40 MG/0.4ML ~~LOC~~ SOLN
40.0000 mg | SUBCUTANEOUS | Status: DC
Start: 2016-04-24 — End: 2016-04-26
  Administered 2016-04-24 – 2016-04-25 (×2): 40 mg via SUBCUTANEOUS
  Filled 2016-04-24 (×2): qty 0.4

## 2016-04-24 MED ORDER — CHLORDIAZEPOXIDE HCL 25 MG PO CAPS
25.0000 mg | ORAL_CAPSULE | Freq: Four times a day (QID) | ORAL | Status: DC | PRN
  Administered 2016-04-24: 25 mg via ORAL
  Filled 2016-04-24: qty 1

## 2016-04-24 MED ORDER — ADULT MULTIVITAMIN W/MINERALS CH
1.0000 | ORAL_TABLET | Freq: Every day | ORAL | Status: DC
  Administered 2016-04-24 – 2016-04-26 (×3): 1 via ORAL
  Filled 2016-04-24 (×3): qty 1

## 2016-04-24 MED ORDER — THIAMINE HCL 100 MG/ML IJ SOLN: 100.0000 mg | Freq: Every day | INTRAMUSCULAR | Status: DC

## 2016-04-24 MED ORDER — CHLORDIAZEPOXIDE HCL 25 MG PO CAPS
25.0000 mg | ORAL_CAPSULE | Freq: Three times a day (TID) | ORAL | Status: AC
  Administered 2016-04-25 – 2016-04-26 (×3): 25 mg via ORAL
  Filled 2016-04-24 (×3): qty 1

## 2016-04-24 MED ORDER — HYDROXYZINE HCL 25 MG PO TABS
25.0000 mg | ORAL_TABLET | Freq: Four times a day (QID) | ORAL | Status: DC | PRN
  Administered 2016-04-25 – 2016-04-26 (×4): 25 mg via ORAL
  Filled 2016-04-24 (×4): qty 1

## 2016-04-24 MED ORDER — CHLORDIAZEPOXIDE HCL 25 MG PO CAPS
25.0000 mg | ORAL_CAPSULE | Freq: Once | ORAL | Status: AC
  Administered 2016-04-24: 25 mg via ORAL
  Filled 2016-04-24: qty 1

## 2016-04-24 MED ORDER — ONDANSETRON HCL 4 MG PO TABS: 4.0000 mg | ORAL_TABLET | Freq: Four times a day (QID) | ORAL | Status: DC | PRN

## 2016-04-24 MED ORDER — TRAMADOL HCL 50 MG PO TABS
50.0000 mg | ORAL_TABLET | Freq: Four times a day (QID) | ORAL | Status: DC | PRN
  Administered 2016-04-24 – 2016-04-26 (×4): 50 mg via ORAL
  Filled 2016-04-24 (×5): qty 1

## 2016-04-24 MED ORDER — LOPERAMIDE HCL 2 MG PO CAPS: 2.0000 mg | ORAL_CAPSULE | ORAL | Status: DC | PRN

## 2016-04-24 MED ORDER — ONDANSETRON 4 MG PO TBDP: 4.0000 mg | ORAL_TABLET | Freq: Four times a day (QID) | ORAL | Status: DC | PRN

## 2016-04-24 MED ORDER — POLYETHYLENE GLYCOL 3350 17 G PO PACK: 17.0000 g | PACK | Freq: Every day | ORAL | Status: DC | PRN

## 2016-04-24 MED ORDER — CHLORDIAZEPOXIDE HCL 25 MG PO CAPS
25.0000 mg | ORAL_CAPSULE | Freq: Four times a day (QID) | ORAL | Status: AC
  Administered 2016-04-24 – 2016-04-25 (×4): 25 mg via ORAL
  Filled 2016-04-24 (×4): qty 1

## 2016-04-24 MED ORDER — CHLORDIAZEPOXIDE HCL 25 MG PO CAPS: 25.0000 mg | ORAL_CAPSULE | ORAL | Status: DC

## 2016-04-24 MED ORDER — FLUOXETINE HCL 20 MG PO CAPS
20.0000 mg | ORAL_CAPSULE | Freq: Every day | ORAL | Status: DC
  Administered 2016-04-24 – 2016-04-26 (×3): 20 mg via ORAL
  Filled 2016-04-24 (×3): qty 1

## 2016-04-24 MED ORDER — CHLORDIAZEPOXIDE HCL 25 MG PO CAPS
25.0000 mg | ORAL_CAPSULE | Freq: Once | ORAL | Status: AC
  Filled 2016-04-24: qty 1

## 2016-04-24 NOTE — ED Triage Notes (Signed)
Patient arrives by EMS with complaints of alcohol withdrawal, requesting detox. Patient states last drink was noon on Sunday.  Patient also states she is depressed. Patient is actively vomiting

## 2016-04-24 NOTE — H&P (Signed)
History and Physical    Julie Mornshley Menta OZH:[id number] DOB: August 28, 1976 DOA: 04/24/2016  PCP: Delbert HarnessBRISCOE, KIM, MD ( Patient coming from: home  Chief Complaint: Nausea vomiting, tremors and hallucinations  HPI: Julie Conley is a 40 y.o. female with medical history significant of past medical history significant for alcohol abuse, alcohol withdrawals she has never had delirium tremens., alcoholic lactic acidosis comes in today and she started binge drinking 8 days prior to admission, stopped drinking around 24-30 hours prior to coming to the ED when she didn't started having nausea and vomiting not able to tolerate orals, tremors hallucinations and formication, no vaginal discharge or burning when she urinates, no tingling or numbness.  ED Course: In the ED she was started on IV fluids thiamine folate and Ativan.  Review of Systems: As per HPI otherwise 10 point review of systems negative.   Past Medical History:  Diagnosis Date  . Anxiety   . Bimalleolar fracture of left ankle 04/2015  . Complication of anesthesia    hypotension with breast augmentation  . Depression   . GERD (gastroesophageal reflux disease)   . Headache    "once/week" (03/20/2016)  . Hypothyroidism 2010-2012   "stopped RX in 2012 when I didn't meet standards" (03/20/2016)  . Iron deficiency anemia   . Migraines    "once/month" (03/20/2016)  . Pneumonia "several times"  . Sciatic pain 04/12/2015   left    Past Surgical History:  Procedure Laterality Date  . ABDOMINOPLASTY  2015  . APPENDECTOMY  ~ 1986  . AUGMENTATION MAMMAPLASTY  2015  . CESAREAN SECTION  1996; 2000  . CYSTOSCOPY WITH HYDRODISTENSION AND BIOPSY  2010  . DILATION AND CURETTAGE OF UTERUS  2010  . ENDOMETRIAL ABLATION  2010  . FRACTURE SURGERY    . LAPAROSCOPIC CHOLECYSTECTOMY  2006  . LAPAROSCOPIC TUBAL LIGATION  2001   w/banding  . LAPAROTOMY  2011  . NASAL SEPTOPLASTY W/ TURBINOPLASTY  1991; 2008  . ORIF ANKLE FRACTURE Left 04/15/2015   Procedure: OPEN REDUCTION INTERNAL FIXATION (ORIF) LEFT BIMALLEOLAR AND SYNDESMOSIS ANKLE FRACTURE;  Surgeon: Sheral Apleyimothy D Murphy, MD;  Location:  [facility];  Service: Orthopedics;  Laterality: Left;  . ROUX-EN-Y GASTRIC BYPASS  2009  . TONSILLECTOMY  ~ 1981  . VAGINAL HYSTERECTOMY  2013   complete     reports that she has been smoking Cigarettes.  She has a 10.00 pack-year smoking history. She has never used smokeless tobacco. She reports that she drinks about 46.2 oz of alcohol per week . She reports that she does not use drugs.  Allergies  Allergen Reactions  . Adhesive [Tape] Other (See Comments)    TEARS SKIN  . Hydrocodone Nausea Only    HALLUCINATIONS  . Ambien [Zolpidem Tartrate] Other (See Comments)    Delirium, disorientation  . Sulfa Antibiotics Itching    Family History  Problem Relation Age of Onset  . Drug abuse Brother   . Alcohol abuse Father   . Drug abuse Mother     Prior to Admission medications   Medication Sig Start Date End Date Taking? Authorizing Provider  aspirin-acetaminophen-caffeine (EXCEDRIN MIGRAINE) [phone] MG tablet Take 1 tablet by mouth every 6 (six) hours as needed for headache.   Yes Historical Provider, MD  FLUoxetine (PROZAC) 20 MG capsule Take 1 capsule (20 mg total) by mouth daily. Patient not taking: Reported on 04/24/2016 03/21/16   Lora PaulaJennifer T Krall, MD  pantoprazole (PROTONIX) 40 MG tablet Take 1 tablet (40 mg total) by mouth  daily. Patient not taking: Reported on 04/24/2016 03/21/16   Lora PaulaJennifer T Krall, MD    Physical Exam: Vitals:   04/24/16 0645 04/24/16 0759 04/24/16 0800 04/24/16 0830  BP:  134/79 137/74 134/85  Pulse:  103 103 102  Resp: 15  13 17   Temp:      TempSrc:      SpO2:   99% 99%  Weight:      Height:          Constitutional: NAD, calm, comfortable Vitals:   04/24/16 0645 04/24/16 0759 04/24/16 0800 04/24/16 0830  BP:  134/79 137/74 134/85  Pulse:  103 103 102  Resp: 15  13 17   Temp:        TempSrc:      SpO2:   99% 99%  Weight:      Height:       Eyes: PERRL, lids and conjunctivae normal ENMT: Mucous membranes are moist. Posterior pharynx clear of any exudate or lesions.Normal dentition.  Neck: normal, supple, no masses, no thyromegaly Respiratory: clear to auscultation bilaterally, no wheezing, no crackles. Normal respiratory effort. No accessory muscle use.  Cardiovascular: Regular rate and rhythm, no murmurs / rubs / gallops. No extremity edema. 2+ pedal pulses. No carotid bruits.  Abdomen: no tenderness, no masses palpated. No hepatosplenomegaly. Bowel sounds positive.  Musculoskeletal: no clubbing / cyanosis. No joint deformity upper and lower extremities. Good ROM, no contractures. Normal muscle tone.  Skin: no rashes, lesions, ulcers. No induration Neurologic: CN 2-12 grossly intact. Sensation intact, DTR normal. Strength 5/5 in all 4.  Psychiatric: Normal judgment and insight. Alert and oriented x 3. Normal mood.   (Anything < 9 systems with 2 bullets each down codes to level 1) (If patient refuses exam can't bill higher level) (Make sure to document decubitus ulcers present on admission -- if possible -- and whether patient has chronic indwelling catheter at time of admission)  Labs on Admission: I have personally reviewed following labs and imaging studies  CBC:  Recent Labs Lab 04/24/16 0812  WBC 7.3  HGB 13.3  HCT 38.2  MCV 90.5  PLT 184   Basic Metabolic Panel:  Recent Labs Lab 04/24/16 0812  NA 135  K 3.7  CL 96*  CO2 27  GLUCOSE 113*  BUN 6  CREATININE 0.67  CALCIUM 8.5*   GFR: Estimated Creatinine Clearance: 95.3 mL/min (by C-G formula based on SCr of 0.8 mg/dL). Liver Function Tests:  Recent Labs Lab 04/24/16 0812  AST 43*  ALT 36  ALKPHOS 89  BILITOT 1.0  PROT 7.0  ALBUMIN 4.3   No results for input(s): LIPASE, AMYLASE in the last 168 hours. No results for input(s): AMMONIA in the last 168 hours. Coagulation  Profile: No results for input(s): INR, PROTIME in the last 168 hours. Cardiac Enzymes: No results for input(s): CKTOTAL, CKMB, CKMBINDEX, TROPONINI in the last 168 hours. BNP (last 3 results) No results for input(s): PROBNP in the last 8760 hours. HbA1C: No results for input(s): HGBA1C in the last 72 hours. CBG: No results for input(s): GLUCAP in the last 168 hours. Lipid Profile: No results for input(s): CHOL, HDL, LDLCALC, TRIG, CHOLHDL, LDLDIRECT in the last 72 hours. Thyroid Function Tests: No results for input(s): TSH, T4TOTAL, FREET4, T3FREE, THYROIDAB in the last 72 hours. Anemia Panel: No results for input(s): VITAMINB12, FOLATE, FERRITIN, TIBC, IRON, RETICCTPCT in the last 72 hours. Urine analysis:    Component Value Date/Time   COLORURINE YELLOW 03/20/2016 0538   APPEARANCEUR  CLEAR 03/20/2016 0538   LABSPEC 1.013 03/20/2016 0538   PHURINE 6.0 03/20/2016 0538   GLUCOSEU 100 (A) 03/20/2016 0538   HGBUR NEGATIVE 03/20/2016 0538   BILIRUBINUR NEGATIVE 03/20/2016 0538   KETONESUR 15 (A) 03/20/2016 0538   PROTEINUR 30 (A) 03/20/2016 0538   UROBILINOGEN 1.0 02/26/2013 1916   NITRITE NEGATIVE 03/20/2016 0538   LEUKOCYTESUR NEGATIVE 03/20/2016 0538   Sepsis Labs: !!!!!!!!!!!!!!!!!!!!!!!!!!!!!!!!!!!!!!!!!!!! @LABRCNTIP (procalcitonin:4,lacticidven:4) )No results found for this or any previous visit (from the past 240 hour(s)).   Radiological Exams on Admission: No results found.  EKG: Independently reviewed. Sinus tachycardia with left anterior fascicular block left axis deviation no T wave abnormalities  Assessment/Plan Alcohol withdrawal (HCC) She is having mild hyperactive symptoms, like tachycardia hypertension and visual hallucinations. She was given Ativan in the ED I will go ahead and repeat the dose of Ativan, and will start her on Librium protocol. We will continue to monitor CIWA protocol closely, will have a low threshold if her score was greater than 15 we will  have to reevaluate her. Continue IV thiamine and folate, also started on aggressive IV fluid hydration. Check a UDS and UA.  Sinus tachycardia (HCC) Likely due to withdrawal symptoms.  Normocytic anemia Her CBC is hemoconcentrated likely due to hypovolemia, will start on aggressive IV fluid hydration expected drop in her hemoglobin.    DVT prophylaxis: lovenox Code Status: full Family Communication: none Disposition Plan: home once withdrawls improved Consults called: none Admission status: observation   Marinda Elk MD [facility] Pager [phone]  If 7PM-7AM, please contact night-coverage [email or web address] Password [facility]  04/24/2016, 11:16 AM

## 2016-04-24 NOTE — ED Provider Notes (Signed)
[facility]-EMERGENCY DEPT Provider Note   CSN: [id number] Arrival date & time: 04/24/16  [id number]  History   Chief Complaint Chief Complaint  Patient presents with  . Alcohol Intoxication  . Emesis  . Depression    HPI Julie Conley is a 40 y.o. female.  HPI 40 y.o. female with a hx of Anxiety, GERD, ETOH Withdrawal, ETOH Abuse, presents to the Emergency Department today complaining of ETOH withdrawal. Pt states that she went on an ETOH binge x 1 week and drank a 5th of tequila per day. Notes the cause was due to a fight with her son. States that she has been admitted in the past for the same. Has Hx of withdrawals. No hx seizures. Discharged last month on 03-21-16 due to same (see below). Currently pt states that "she hurts all over" and feels like she is popping out of her skin. Notes lower abdominal pain that is typical for her withdrawals. No dysuria. No vaginal bleeding. No vaginal discharge. Notes dry heaving without production. Pt states that she has had little to eat in the last days and has just had alcohol. No fevers. No numbness/tingling. No other symptoms noted. Pt states that she wants to get detox   Discharge Note 03-21-16: Patient presents after an 8 day binge drinking episode with 2 pints of tequila per day due to recent stressors at home and underlying dementia. She presented with tachycardia, urine with ketones and alcohol level of 203. Anion gap of 23. CIWA 5. Patient received 2L of NS in the ED with improvement of her tachycardia from 140s to 110s. Patient is at higher risk of alcohol dependency, intoxication and electrolyte disturbances given her gastric bypass in 2009. She was admitted for observation. She was continued on a CIWA protocol and given D5 NS over night. She was also given thiamine and folate. By hospital day 2, her tachycardia had improved and her CO2 improved to 27. Fluids were discontinued. At discharge, she was tolerating PO intake. She was discharged with  instructions to follow up with her PCP.  Past Medical History:  Diagnosis Date  . Anxiety   . Bimalleolar fracture of left ankle 04/2015  . Complication of anesthesia    hypotension with breast augmentation  . Depression   . GERD (gastroesophageal reflux disease)   . Headache    "once/week" (03/20/2016)  . Hypothyroidism 2010-2012   "stopped RX in 2012 when I didn't meet standards" (03/20/2016)  . Iron deficiency anemia   . Migraines    "once/month" (03/20/2016)  . Pneumonia "several times"  . Sciatic pain 04/12/2015   left    Patient Active Problem List   Diagnosis Date Noted  . Alcoholic ketoacidosis 03/20/2016  . Chest pain 03/20/2016  . Alcohol consumption binge drinking 03/20/2016  . Depression 03/20/2016  . Gastritis 03/20/2016  . 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 Elevated lipase   . Hypokalemia 01/20/2016  . Hyponatremia 01/20/2016  . Alcohol dependence with withdrawal with complication (HCC) 11/05/2014  . 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 ABDOMINOPLASTY  2015  . APPENDECTOMY  ~ 1986  . AUGMENTATION MAMMAPLASTY  2015  . CESAREAN SECTION  1996; 2000  . CYSTOSCOPY WITH HYDRODISTENSION AND BIOPSY  2010  . DILATION AND CURETTAGE OF UTERUS  2010  . ENDOMETRIAL ABLATION  2010  . FRACTURE SURGERY    . LAPAROSCOPIC CHOLECYSTECTOMY  2006  . LAPAROSCOPIC TUBAL LIGATION  2001   w/banding  .  LAPAROTOMY  2011  . NASAL SEPTOPLASTY W/ TURBINOPLASTY  1991; 2008  . ORIF ANKLE FRACTURE Left 04/15/2015   Procedure: OPEN REDUCTION INTERNAL FIXATION (ORIF) LEFT BIMALLEOLAR AND SYNDESMOSIS ANKLE FRACTURE;  Surgeon: Sheral Apley, MD;  Location: [facility];  Service: Orthopedics;  Laterality: Left;  . ROUX-EN-Y GASTRIC BYPASS  2009  . TONSILLECTOMY  ~ 1981  . VAGINAL HYSTERECTOMY  2013   complete    OB History    No data available       Home Medications    Prior to Admission medications     Medication Sig Start Date End Date Taking? Authorizing Provider  FLUoxetine (PROZAC) 20 MG capsule Take 1 capsule (20 mg total) by mouth daily. 03/21/16   Lora Paula, MD  pantoprazole (PROTONIX) 40 MG tablet Take 1 tablet (40 mg total) by mouth daily. 03/21/16   Lora Paula, MD    Family History Family History  Problem Relation Age of Onset  . Drug abuse Brother   . Alcohol abuse Father   . Drug abuse Mother     Social History Social History  Substance Use Topics  . Smoking status: Current Every Day Smoker    Packs/day: 0.12    Years: 20.00  . Smokeless tobacco: Never Used  . Alcohol use 46.2 oz/week    77 Shots of liquor per week     Comment: 03/20/2016 "I've had 1 pint of  tequila/day for 8 days"     Allergies   Adhesive [tape]; Hydrocodone; Ambien [zolpidem tartrate]; and Sulfa antibiotics   Review of Systems Review of Systems ROS reviewed and all are negative for acute change except as noted in the HPI.  Physical Exam Updated Vital Signs BP 149/93 (BP Location: Right Arm)   Pulse 108   Temp 97.4 F (36.3 C) (Oral)   Resp 17   Ht 5\' 4"  (1.626 m)   Wt 79.4 kg   SpO2 99%   BMI 30.04 kg/m   Physical Exam  Constitutional: She is oriented to person, place, and time. Vital signs are normal. She appears well-developed and well-nourished.  Resting Tremor noted. Actively dry heaving.   HENT:  Head: Normocephalic.  Right Ear: Hearing normal.  Left Ear: Hearing normal.  Eyes: Conjunctivae and EOM are normal. Pupils are equal, round, and reactive to light.  Neck: Normal range of motion. Neck supple.  Cardiovascular: Regular rhythm, normal heart sounds, intact distal pulses and normal pulses.  Tachycardia present.   Pulmonary/Chest: Effort normal.  Abdominal: Normal appearance. There is no tenderness. There is no rigidity, no rebound, no guarding, no CVA tenderness, no tenderness at McBurney's point and negative Murphy's sign.  Neurological: She is alert and  oriented to person, place, and time. She has normal strength. No cranial nerve deficit or sensory deficit.  Cranial Nerves:  II: Pupils equal, round, reactive to light III,IV, VI: ptosis not present, extra-ocular motions intact bilaterally  V,VII: smile symmetric, facial light touch sensation equal VIII: hearing grossly normal bilaterally  IX,X: midline uvula rise  XI: bilateral shoulder shrug equal and strong XII: midline tongue extension  Skin: Skin is warm and dry.  Psychiatric: She has a normal mood and affect. Her speech is normal and behavior is normal. Thought content normal.  Nursing note and vitals reviewed.  ED Treatments / Results  Labs (all labs ordered are listed, but only abnormal results are displayed) Labs Reviewed  COMPREHENSIVE METABOLIC PANEL - Abnormal; Notable for the following:  Result Value   Chloride 96 (*)    Glucose, Bld 113 (*)    Calcium 8.5 (*)    AST 43 (*)    All other components within normal limits  ETHANOL  CBC  URINE RAPID DRUG SCREEN, HOSP PERFORMED  URINALYSIS, ROUTINE W REFLEX MICROSCOPIC (NOT AT [facility])    EKG  EKG Interpretation None       Radiology No results found.  Procedures Procedures (including critical care time)  Medications Ordered in ED Medications  LORazepam (ATIVAN) injection 0-4 mg (1 mg Intravenous Given 04/24/16 0633)    Followed by  LORazepam (ATIVAN) injection 0-4 mg (not administered)  thiamine (VITAMIN B-1) tablet 100 mg (100 mg Oral Given 04/24/16 0916)    Or  thiamine (B-1) injection 100 mg ( Intravenous See Alternative 04/24/16 0916)  sodium chloride 0.9 % bolus 1,000 mL (0 mLs Intravenous Stopped 04/24/16 0754)  folic acid (FOLVITE) tablet 1 mg (1 mg Oral Given 04/24/16 0636)  ondansetron (ZOFRAN) injection 4 mg (4 mg Intravenous Given 04/24/16 0815)  LORazepam (ATIVAN) tablet 1 mg (1 mg Oral Given 04/24/16 0916)  sodium chloride 0.9 % bolus 1,000 mL (1,000 mLs Intravenous New Bag/Given 04/24/16 0917)      Initial Impression / Assessment and Plan / ED Course  I have reviewed the triage vital signs and the nursing notes.  Pertinent labs & imaging results that were available during my care of the patient were reviewed by me and considered in my medical decision making (see chart for details).  Clinical Course     Final Clinical Impressions(s) / ED Diagnoses  I have reviewed and evaluated the relevant laboratory values. I have interpreted the relevant EKG. I have reviewed the relevant previous healthcare records. I have reviewed EMS Documentation. I obtained HPI from historian. Patient discussed with supervising physician  ED Course:   Assessment: Pt is a 40yF with hx Anxiety, GERD, ETOH Withdrawal, ETOH Abuse who presents with N/V with resting tremor. Hx same. Recent DC on 03-21-16 for same. Noted binging x 1 week. Last ETOH intake yesterday around 4pm. Noted drinking 5th of Tequila daily. On exam, pt dry heaving. Resting tremor. Nontoxic/nonseptic appearing. VS with Tachycardia. Afebrile. Lungs CTA. Heart RRR. Abdomen nontender soft. CBC/BMP unremarkable. No gap.. Ethanol <5. Given fluids, thiamine, folate, Ativan per CIWA protocol in ED. CIWA Score 14-15. Pt remains tachycardic despite fluids and ativan. Pt would benefit with overnight obs. Pt adamant for detox. Plan is to Admit to medicine.    Disposition/Plan:  Admit Pt acknowledges and agrees with plan  Supervising Physician Azalia BilisKevin Campos, MD   Final diagnoses:  ETOH abuse  Alcohol withdrawal, uncomplicated [facility](HCC)    New Prescriptions New Prescriptions   No medications on file     Audry Piliyler Pasqual Farias, PA-C 04/24/16 1052    Azalia BilisKevin Campos, MD 04/24/16 [phone]

## 2016-04-24 NOTE — ED Notes (Signed)
Pt is unable to give a urine sample but is aware one is needed.

## 2016-04-24 NOTE — ED Notes (Signed)
Attempted to call report but RN was at lunch. Will call back when she is available

## 2016-04-24 NOTE — ED Notes (Signed)
Bed: RESA Expected date:  Expected time:  Means of arrival:  Comments: EMS 40 yo female drinking heavily and vomiting

## 2016-04-25 LAB — RETICULOCYTES
RBC.: 3.78 MIL/uL — AB (ref 3.87–5.11)
RETIC COUNT ABSOLUTE: 41.6 10*3/uL (ref 19.0–186.0)
RETIC CT PCT: 1.1 % (ref 0.4–3.1)

## 2016-04-25 LAB — VITAMIN B12: Vitamin B-12: 252 pg/mL (ref 180–914)

## 2016-04-25 LAB — BASIC METABOLIC PANEL
Anion gap: 8 (ref 5–15)
BUN: 5 mg/dL — AB (ref 6–20)
CO2: 30 mmol/L (ref 22–32)
Calcium: 8.9 mg/dL (ref 8.9–10.3)
Chloride: 100 mmol/L — ABNORMAL LOW (ref 101–111)
Creatinine, Ser: 0.58 mg/dL (ref 0.44–1.00)
GFR calc Af Amer: >60 mL/min (ref >60)
GLUCOSE: 103 mg/dL — AB (ref 65–99)
POTASSIUM: 3.1 mmol/L — AB (ref 3.5–5.1)
Sodium: 138 mmol/L (ref 135–145)

## 2016-04-25 LAB — CBC
HEMATOCRIT: 34.1 % — AB (ref 36.0–46.0)
Hemoglobin: 11.8 g/dL — ABNORMAL LOW (ref 12.0–15.0)
MCH: 31.8 pg (ref 26.0–34.0)
MCHC: 34.6 g/dL (ref 30.0–36.0)
MCV: 91.9 fL (ref 78.0–100.0)
PLATELETS: 129 10*3/uL — AB (ref 150–400)
RBC: 3.71 MIL/uL — AB (ref 3.87–5.11)
RDW: 12.7 % (ref 11.5–15.5)
WBC: 4.1 10*3/uL (ref 4.0–10.5)

## 2016-04-25 LAB — FERRITIN: FERRITIN: 69 ng/mL (ref 11–307)

## 2016-04-25 LAB — IRON AND TIBC
Iron: 100 ug/dL (ref 28–170)
SATURATION RATIOS: 37 % — AB (ref 10.4–31.8)
TIBC: 267 ug/dL (ref 250–450)
UIBC: 167 ug/dL

## 2016-04-25 MED ORDER — POTASSIUM CHLORIDE CRYS ER 20 MEQ PO TBCR
40.0000 meq | EXTENDED_RELEASE_TABLET | Freq: Two times a day (BID) | ORAL | Status: AC
  Administered 2016-04-25 (×2): 40 meq via ORAL
  Filled 2016-04-25 (×2): qty 2

## 2016-04-25 NOTE — Progress Notes (Signed)
[facility] PROGRESS NOTE    Progress Note  Julie Conley  VFI:[id number] DOB: 11/01/1975 DOA: 04/24/2016 PCP: Delbert HarnessBRISCOE, KIM, MD     Brief Narrative:   Julie Conley is an 40 y.o. female past medical history of alcohol abuse, withdraws an alcoholic acidosis that comes in for nausea vomiting tremors and formication for medications.  Assessment/Plan:   Alcohol withdrawal (HCC): Her ZCIWA score has decreased to 6 continue routine protocol. UDS was positive for marijuana, UA showed no signs of infection. Was able to tolerate her diet now, will increase Protonix to twice a days she relates some nausea.  Sinus tachycardia (HCC) No resolved.  Normocytic anemia Check an anemia panel, there is minimal hemoglobin hemoglobin was probably hemodilution. She is menstruating female is probably iron deficiency anemia plus or minus alcohol or vitamin deficiency. B-12 and RBC folate are pending.   DVT prophylaxis: lovenox Family Communication:none Disposition Plan/Barrier to D/C: home in am Code Status:     Code Status Orders        Start     Ordered   04/24/16 1302  Full code  Continuous     04/24/16 1301    Code Status History    Date Active Date Inactive Code Status Order ID Comments User Context   03/20/2016 12:29 PM 03/21/2016  5:08 PM Full Code [id number]  Servando SnareAlexa R Burns, MD Inpatient   01/20/2016  4:49 AM 01/22/2016  6:21 PM Full Code [id number]  Clydie Braunondell A Smith, MD ED   02/26/2013  7:05 PM 02/27/2013  2:23 AM Full Code [id number]  Carleene CooperAlan Davidson, MD ED        IV Access:    Peripheral IV   Procedures and diagnostic studies:   No results found.   Medical Consultants:    None.  Anti-Infectives:   None  Subjective:    Julie Conley she relates her tremors are much better, her nausea is improved but still mildly nauseated she is tolerating her diet.  Objective:    Vitals:   04/24/16 1300 04/24/16 1327 04/24/16 2007 04/25/16 0506  BP: 124/74 (!)  142/84 (!) 129/93 112/89  Pulse: 84 91 79 72  Resp:  16 17 16   Temp:  98.2 F (36.8 C) 98.8 F (37.1 C) 98.5 F (36.9 C)  TempSrc:  Oral Oral Oral  SpO2: 95% 98% 99% 97%  Weight:  77.1 kg (170 lb)    Height:  5\' 4"  (1.626 m)      Intake/Output Summary (Last 24 hours) at 04/25/16 0751 Last data filed at 04/24/16 1900  Gross per 24 hour  Intake           910.84 ml  Output                0 ml  Net           910.84 ml   Filed Weights   04/24/16 0529 04/24/16 1327  Weight: 79.4 kg (175 lb) 77.1 kg (170 lb)    Exam: General exam: In no acute distress. Respiratory system: Good air movement and clear to auscultation. Cardiovascular system: S1 & S2 heard, RRR.  Gastrointestinal system: Abdomen is nondistended, soft and nontender.  Central nervous system: Alert and oriented. No focal neurological deficits. Extremities: No pedal edema. Skin: No rashes, lesions or ulcers Psychiatry: Judgement and insight appear normal. Mood & affect appropriate.    Data Reviewed:    Labs: Basic Metabolic Panel:  Recent Labs Lab 04/24/16 0812 04/25/16 0532  NA 135 138  K 3.7 3.1*  CL 96* 100*  CO2 27 30  GLUCOSE 113* 103*  BUN 6 5*  CREATININE 0.67 0.58  CALCIUM 8.5* 8.9   GFR Estimated Creatinine Clearance: 94 mL/min (by C-G formula based on SCr of 0.8 mg/dL). Liver Function Tests:  Recent Labs Lab 04/24/16 0812  AST 43*  ALT 36  ALKPHOS 89  BILITOT 1.0  PROT 7.0  ALBUMIN 4.3   No results for input(s): LIPASE, AMYLASE in the last 168 hours. No results for input(s): AMMONIA in the last 168 hours. Coagulation profile No results for input(s): INR, PROTIME in the last 168 hours.  CBC:  Recent Labs Lab 04/24/16 0812 04/25/16 0532  WBC 7.3 4.1  HGB 13.3 11.8*  HCT 38.2 34.1*  MCV 90.5 91.9  PLT 184 129*   Cardiac Enzymes: No results for input(s): CKTOTAL, CKMB, CKMBINDEX, TROPONINI in the last 168 hours. BNP (last 3 results) No results for input(s): PROBNP in the  last 8760 hours. CBG: No results for input(s): GLUCAP in the last 168 hours. D-Dimer: No results for input(s): DDIMER in the last 72 hours. Hgb A1c: No results for input(s): HGBA1C in the last 72 hours. Lipid Profile: No results for input(s): CHOL, HDL, LDLCALC, TRIG, CHOLHDL, LDLDIRECT in the last 72 hours. Thyroid function studies: No results for input(s): TSH, T4TOTAL, T3FREE, THYROIDAB in the last 72 hours.  Invalid input(s): FREET3 Anemia work up: No results for input(s): VITAMINB12, FOLATE, FERRITIN, TIBC, IRON, RETICCTPCT in the last 72 hours. Sepsis Labs:  Recent Labs Lab 04/24/16 0812 04/25/16 0532  WBC 7.3 4.1   Microbiology No results found for this or any previous visit (from the past 240 hour(s)).   Medications:   . chlordiazePOXIDE  25 mg Oral QID   Followed by  . chlordiazePOXIDE  25 mg Oral TID   Followed by  . [START ON 04/26/2016] chlordiazePOXIDE  25 mg Oral BH-qamhs   Followed by  . [START ON 04/27/2016] chlordiazePOXIDE  25 mg Oral Daily  . enoxaparin (LOVENOX) injection  40 mg Subcutaneous Q24H  . FLUoxetine  20 mg Oral Daily  . multivitamin with minerals  1 tablet Oral Daily  . pantoprazole  40 mg Oral Daily  . thiamine  100 mg Oral Daily   Continuous Infusions: . dextrose 5 % and 0.45% NaCl 125 mL/hr at 04/24/16 2137    Time spent: 25 min   LOS: 0 days   Marinda ElkFELIZ ORTIZ, Cashel Bellina  [facility] Pager [phone]  *Please refer to [email or web address], password [email or web address] to get updated schedule on who will round on this patient, as hospitalists switch teams weekly. If 7PM-7AM, please contact night-coverage at [email or web address], password [email or web address] for any overnight needs.  04/25/2016, 7:51 AM

## 2016-04-26 DIAGNOSIS — D509 Iron deficiency anemia, unspecified: Secondary | ICD-10-CM

## 2016-04-26 MED ORDER — CHLORDIAZEPOXIDE HCL 25 MG PO CAPS: 25.0000 mg | ORAL_CAPSULE | Freq: Every day | ORAL | 0 refills | Status: DC

## 2016-04-26 MED ORDER — HYDROXYZINE HCL 25 MG PO TABS: 25.0000 mg | ORAL_TABLET | Freq: Four times a day (QID) | ORAL | 0 refills | Status: DC | PRN

## 2016-04-26 MED ORDER — FERROUS SULFATE 325 (65 FE) MG PO TBEC: 325.0000 mg | DELAYED_RELEASE_TABLET | Freq: Three times a day (TID) | ORAL | 3 refills | Status: DC

## 2016-04-26 MED ORDER — FLUOXETINE HCL 20 MG PO CAPS: 20.0000 mg | ORAL_CAPSULE | Freq: Every day | ORAL | 0 refills | Status: DC

## 2016-04-26 NOTE — Progress Notes (Signed)
Pt discharged to home. DC instructions given. Prescriptions x 2 given. Pt encouraged to stop by pharmacy and pick up 2 meds that were e-prescribed by MD. Acknowledged same. Left unit ambulatorily accompanied by female friend. No concerns voiced.  VWilliams, rn.

## 2016-04-26 NOTE — Discharge Summary (Signed)
Physician Discharge Summary  Julie Conley ZOX:[id number] DOB: [birth year] DOA: 04/24/2016  PCP: Delbert HarnessBRISCOE, KIM, MD  Admit date: 04/24/2016 Discharge date: 04/26/2016  Admitted From: home Disposition:  Home  Recommendations for Outpatient Follow-up:  1. Follow up with PCP in 1-2 weeks   Home Health:no Equipment/Devices:none  Discharge Condition:stable CODE STATUS:full Diet recommendation: Regular    Brief/Interim Summary: 40 year old with past medical history of alcohol abuse and alcohol withdrawals comes in today for symptoms of withdrawal with tachycardia nausea vomiting hallucinations and for medications that started on the day of admission.  Discharge Diagnoses:  Active Problems:   Alcohol withdrawal (HCC)   Sinus tachycardia (HCC)   Normocytic anemia  She was admitted to the hospital and started on CIWA protocol [place] improve, UDS was positive for marijuana UA showed no signs of infection. After 24 hours from admission she was to tolerate her diet so Protonix was changed to once a day. Her sinus tachycardia was likely due to withdrawals. An anemia panel was checked and it showed a ferritin of 69 and a low saturation, so her anemia is due to iron deficient she was sent home on Percocet from 325. She relates that she will get itching after she was sent on a short course of Atarax.  Discharge Instructions  Discharge Instructions    Diet - low sodium heart healthy    Complete by:  As directed   Increase activity slowly    Complete by:  As directed       Medication List    TAKE these medications   aspirin-acetaminophen-caffeine 250-250-65 MG tablet Commonly known as:  EXCEDRIN MIGRAINE Take 1 tablet by mouth every 6 (six) hours as needed for headache.   chlordiazePOXIDE 25 MG capsule Commonly known as:  LIBRIUM Take 1 capsule (25 mg total) by mouth daily. Start taking on:  04/27/2016   FLUoxetine 20 MG capsule Commonly known as:  PROZAC Take 1 capsule (20 mg  total) by mouth daily.   hydrOXYzine 25 MG tablet Commonly known as:  ATARAX/VISTARIL Take 1 tablet (25 mg total) by mouth every 6 (six) hours as needed for anxiety (or CIWA score </= 10).   pantoprazole 40 MG tablet Commonly known as:  PROTONIX Take 1 tablet (40 mg total) by mouth daily.       Allergies  Allergen Reactions  . Adhesive [Tape] Other (See Comments)    TEARS SKIN  . Hydrocodone Nausea Only    HALLUCINATIONS  . Ambien [Zolpidem Tartrate] Other (See Comments)    Delirium, disorientation  . Sulfa Antibiotics Itching    Consultations:  None   Procedures/Studies:  No results found.   Subjective:   Discharge Exam: Vitals:   04/25/16 2000 04/26/16 0419  BP: 135/80 120/83  Pulse: 89 72  Resp: 20 18  Temp: 98.5 F (36.9 C) 98.1 F (36.7 C)   Vitals:   04/25/16 1413 04/25/16 1823 04/25/16 2000 04/26/16 0419  BP: (!) 124/93 138/82 135/80 120/83  Pulse: 75 83 89 72  Resp: 17  20 18   Temp: 98.6 F (37 C)  98.5 F (36.9 C) 98.1 F (36.7 C)  TempSrc: Oral  Oral Oral  SpO2: 99%  100% 99%  Weight:      Height:        General: Pt is alert, awake, not in acute distress Cardiovascular: RRR, S1/S2 +, no rubs, no gallops Respiratory: CTA bilaterally, no wheezing, no rhonchi Abdominal: Soft, NT, ND, bowel sounds + Extremities: no edema, no cyanosis  The results of significant diagnostics from this hospitalization (including imaging, microbiology, ancillary and laboratory) are listed below for reference.     Microbiology: No results found for this or any previous visit (from the past 240 hour(s)).   Labs: BNP (last 3 results) No results for input(s): BNP in the last 8760 hours. Basic Metabolic Panel:  Recent Labs Lab 04/24/16 0812 04/25/16 0532  NA 135 138  K 3.7 3.1*  CL 96* 100*  CO2 27 30  GLUCOSE 113* 103*  BUN 6 5*  CREATININE 0.67 0.58  CALCIUM 8.5* 8.9   Liver Function Tests:  Recent Labs Lab 04/24/16 0812  AST 43*   ALT 36  ALKPHOS 89  BILITOT 1.0  PROT 7.0  ALBUMIN 4.3   No results for input(s): LIPASE, AMYLASE in the last 168 hours. No results for input(s): AMMONIA in the last 168 hours. CBC:  Recent Labs Lab 04/24/16 0812 04/25/16 0532  WBC 7.3 4.1  HGB 13.3 11.8*  HCT 38.2 34.1*  MCV 90.5 91.9  PLT 184 129*   Cardiac Enzymes: No results for input(s): CKTOTAL, CKMB, CKMBINDEX, TROPONINI in the last 168 hours. BNP: Invalid input(s): POCBNP CBG: No results for input(s): GLUCAP in the last 168 hours. D-Dimer No results for input(s): DDIMER in the last 72 hours. Hgb A1c No results for input(s): HGBA1C in the last 72 hours. Lipid Profile No results for input(s): CHOL, HDL, LDLCALC, TRIG, CHOLHDL, LDLDIRECT in the last 72 hours. Thyroid function studies No results for input(s): TSH, T4TOTAL, T3FREE, THYROIDAB in the last 72 hours.  Invalid input(s): FREET3 Anemia work up  Recent Labs  04/25/16 0912  VITAMINB12 252  FERRITIN 69  TIBC 267  IRON 100  RETICCTPCT 1.1   Urinalysis    Component Value Date/Time   COLORURINE AMBER (A) 04/24/2016 1204   APPEARANCEUR CLOUDY (A) 04/24/2016 1204   LABSPEC 1.021 04/24/2016 1204   PHURINE 6.0 04/24/2016 1204   GLUCOSEU NEGATIVE 04/24/2016 1204   HGBUR NEGATIVE 04/24/2016 1204   BILIRUBINUR NEGATIVE 04/24/2016 1204   KETONESUR 40 (A) 04/24/2016 1204   PROTEINUR 30 (A) 04/24/2016 1204   UROBILINOGEN 1.0 02/26/2013 1916   NITRITE NEGATIVE 04/24/2016 1204   LEUKOCYTESUR SMALL (A) 04/24/2016 1204   Sepsis Labs Invalid input(s): PROCALCITONIN,  WBC,  LACTICIDVEN Microbiology No results found for this or any previous visit (from the past 240 hour(s)).   Time coordinating discharge: Over 30 minutes  SIGNED:   Marinda ElkFELIZ ORTIZ, ABRAHAM, MD  [facility] 04/26/2016, 7:57 AM Pager   If 7PM-7AM, please contact night-coverage [email or web address] Password [email or web address]

## 2016-04-27 LAB — FOLATE RBC
Folate, Hemolysate: 256.1 ng/mL
Folate, RBC: 744 ng/mL (ref >498)
HEMATOCRIT: 34.4 % (ref 34.0–46.6)

## 2016-05-21 ENCOUNTER — Encounter (HOSPITAL_BASED_OUTPATIENT_CLINIC_OR_DEPARTMENT_OTHER): Payer: Self-pay

## 2016-05-21 ENCOUNTER — Emergency Department (HOSPITAL_BASED_OUTPATIENT_CLINIC_OR_DEPARTMENT_OTHER)
Admission: EM | Admit: 2016-05-21 | Discharge: 2016-05-21 | Disposition: A | Discharge status: Home / Self Care | Payer: Self-pay | Attending: Emergency Medicine | Admitting: Emergency Medicine

## 2016-05-21 DIAGNOSIS — F17210 Nicotine dependence, cigarettes, uncomplicated: Secondary | ICD-10-CM | POA: Insufficient documentation

## 2016-05-21 DIAGNOSIS — A599 Trichomoniasis, unspecified: Secondary | ICD-10-CM | POA: Insufficient documentation

## 2016-05-21 DIAGNOSIS — E039 Hypothyroidism, unspecified: Secondary | ICD-10-CM | POA: Insufficient documentation

## 2016-05-21 LAB — URINALYSIS, ROUTINE W REFLEX MICROSCOPIC
BILIRUBIN URINE: NEGATIVE
GLUCOSE, UA: NEGATIVE mg/dL
HGB URINE DIPSTICK: NEGATIVE
Ketones, ur: NEGATIVE mg/dL
Leukocytes, UA: NEGATIVE
Nitrite: NEGATIVE
Protein, ur: NEGATIVE mg/dL
SPECIFIC GRAVITY, URINE: 1.002 — AB (ref 1.005–1.030)
pH: 6 (ref 5.0–8.0)

## 2016-05-21 LAB — WET PREP, GENITAL
Clue Cells Wet Prep HPF POC: NONE SEEN
SPERM: NONE SEEN
Yeast Wet Prep HPF POC: NONE SEEN

## 2016-05-21 MED ORDER — METRONIDAZOLE 500 MG PO TABS
2000.0000 mg | ORAL_TABLET | Freq: Once | ORAL | Status: AC
  Administered 2016-05-21: 2000 mg via ORAL
  Filled 2016-05-21: qty 4

## 2016-05-21 NOTE — Discharge Instructions (Signed)
Please follow with your primary care doctor in the next 2 days for a check-up. They must obtain records for further management.  ° °Do not hesitate to return to the Emergency Department for any new, worsening or concerning symptoms.  ° °

## 2016-05-21 NOTE — ED Provider Notes (Signed)
[facility] Provider Note   CSN: [id number] Arrival date & time: 05/21/16  1144     History   Chief Complaint Chief Complaint  Patient presents with  . Vaginal Discharge    HPI  Blood pressure 124/64, pulse 89, temperature 98.2 F (36.8 C), temperature source Oral, resp. rate 18, height 5\' 4"  (1.626 m), weight 81.6 kg, SpO2 99 %.  Julie Conley is a 40 y.o. female complaining of vaginal itching, vaginal discharge and lower abdominal pain which she rates at 5 out of 10 onset 2 weeks ago. States that she self treated for a yeast infection with over-the-counter Monistat with little relief. She denies change in urination, nausea, vomiting, change in bowel habits. Pt is status post appendectomy, cholecystectomy, total hysterectomy and bilateral oophorectomy.  HPI  Past Medical History:  Diagnosis Date  . Anxiety   . Bimalleolar fracture of left ankle 04/2015  . Complication of anesthesia    hypotension with breast augmentation  . Depression   . GERD (gastroesophageal reflux disease)   . Headache    "once/week" (03/20/2016)  . Hypothyroidism 2010-2012   "stopped RX in 2012 when I didn't meet standards" (03/20/2016)  . Iron deficiency anemia   . Migraines    "once/month" (03/20/2016)  . Pneumonia "several times"  . Sciatic pain 04/12/2015   left    Patient Active Problem List   Diagnosis Date Noted  . Alcohol withdrawal (HCC) 04/24/2016  . Sinus tachycardia (HCC) 04/24/2016  . Normocytic anemia 04/24/2016  . Alcoholic ketoacidosis 03/20/2016  . Chest pain 03/20/2016  . Alcohol consumption binge drinking 03/20/2016  . Depression 03/20/2016  . Gastritis 03/20/2016  . 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 Elevated lipase   . Hypokalemia 01/20/2016  . Hyperglycemia 01/20/2016  . Hyponatremia 01/20/2016  . Alcohol dependence with withdrawal with complication (HCC) 11/05/2014  . 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 ABDOMINOPLASTY  2015  . APPENDECTOMY  ~ 1986  . AUGMENTATION MAMMAPLASTY  2015  . CESAREAN SECTION  1996; 2000  . CYSTOSCOPY WITH HYDRODISTENSION AND BIOPSY  2010  . DILATION AND CURETTAGE OF UTERUS  2010  . ENDOMETRIAL ABLATION  2010  . FRACTURE SURGERY    . LAPAROSCOPIC CHOLECYSTECTOMY  2006  . LAPAROSCOPIC TUBAL LIGATION  2001   w/banding  . LAPAROTOMY  2011  . NASAL SEPTOPLASTY W/ TURBINOPLASTY  1991; 2008  . ORIF ANKLE FRACTURE Left 04/15/2015   Procedure: OPEN REDUCTION INTERNAL FIXATION (ORIF) LEFT BIMALLEOLAR AND SYNDESMOSIS ANKLE FRACTURE;  Surgeon: Sheral Apley, MD;  Location: [facility];  Service: Orthopedics;  Laterality: Left;  . ROUX-EN-Y GASTRIC BYPASS  2009  . TONSILLECTOMY  ~ 1981  . VAGINAL HYSTERECTOMY  2013   complete    OB History    No data available       Home Medications    Prior to Admission medications   Medication Sig Start Date End Date Taking? Authorizing Provider  ferrous sulfate 325 (65 FE) MG EC tablet Take 1 tablet (325 mg total) by mouth 3 (three) times daily with meals. 04/26/16   Marinda Elk, MD    Family History Family History  Problem Relation Age of Onset  . Drug abuse Brother   . Alcohol abuse Father   . Drug abuse Mother     Social History Social History  Substance Use Topics  . Smoking status: Current Every Day Smoker    Packs/day:  0.50    Years: 20.00    Types: Cigarettes  . Smokeless tobacco: Never Used  . Alcohol use No     Allergies   Adhesive [tape]; Ambien [zolpidem tartrate]; Oxycodone; and Sulfa antibiotics   Review of Systems Review of Systems  10 systems reviewed and found to be negative, except as noted in the HPI.   Physical Exam Updated Vital Signs BP 124/64 (BP Location: Left Arm)   Pulse 89   Temp 98.2 F (36.8 C) (Oral)   Resp 18   Ht 5\' 4"  (1.626 m)   Wt 81.6 kg   SpO2 99%   BMI 30.90 kg/m   Physical Exam  Constitutional: She is oriented  to person, place, and time. She appears well-developed and well-nourished. No distress.  HENT:  Head: Normocephalic and atraumatic.  Mouth/Throat: Oropharynx is clear and moist.  Eyes: Conjunctivae and EOM are normal. Pupils are equal, round, and reactive to light.  Neck: Normal range of motion.  Cardiovascular: Normal rate, regular rhythm and intact distal pulses.   Pulmonary/Chest: Effort normal and breath sounds normal.  Abdominal: Soft. Bowel sounds are normal. She exhibits no distension and no mass. There is no tenderness. There is no rebound and no guarding. No hernia.  Genitourinary:  Genitourinary Comments: Pelvic exam a chaperoned by technician: No rashes or lesions, there is a thin, white, non-foul-smelling vaginal discharge. No cervical motion or adnexal tenderness.  Musculoskeletal: Normal range of motion.  Neurological: She is alert and oriented to person, place, and time.  Skin: She is not diaphoretic.  Psychiatric: She has a normal mood and affect.  Nursing note and vitals reviewed.    ED Treatments / Results  Labs (all labs ordered are listed, but only abnormal results are displayed) Labs Reviewed  URINALYSIS, ROUTINE W REFLEX MICROSCOPIC (NOT AT [facility]) - Abnormal; Notable for the following:       Result Value   Specific Gravity, Urine 1.002 (*)    All other components within normal limits  WET PREP, GENITAL  GC/CHLAMYDIA PROBE AMP ([place]) NOT AT [facility]    EKG  EKG Interpretation None       Radiology No results found.  Procedures Procedures (including critical care time)  Medications Ordered in ED Medications - No data to display   Initial Impression / Assessment and Plan / ED Course  I have reviewed the triage vital signs and the nursing notes.  Pertinent labs & imaging results that were available during my care of the patient were reviewed by me and considered in my medical decision making (see chart for details).  Clinical Course     Vitals:   05/21/16 1150 05/21/16 1412  BP: 124/64 139/89  Pulse: 89 78  Resp: 18 18  Temp: 98.2 F (36.8 C) 97.9 F (36.6 C)  TempSrc: Oral Oral  SpO2: 99% 100%  Weight: 81.6 kg   Height: 5\' 4"  (1.626 m)     Medications  metroNIDAZOLE (FLAGYL) tablet 2,000 mg (2,000 mg Oral Given 05/21/16 1411)    Neela Zecca is 40 y.o. female presenting with Vaginal discharge and vaginal itching onset week ago she has some lower abdominal pain however my abdominal exam is benign. Pelvic exam is without significant abnormality. Wet prep shows trichomoniasis. Patient is treated with 2 g Flagyl by mouth. GC chlamydia pending. Repeat abdominal exam remains benign. Patient given referral to OB/GYN.  Evaluation does not show pathology that would require ongoing emergent intervention or inpatient treatment. Pt is hemodynamically stable and  mentating appropriately. Discussed findings and plan with patient/guardian, who agrees with care plan. All questions answered. Return precautions discussed and outpatient follow up given.      Final Clinical Impressions(s) / ED Diagnoses   Final diagnoses:  None    New Prescriptions New Prescriptions   No medications on file     Wynetta Emeryicole Tayley Mudrick, PA-C 05/21/16 1620    Nelva Nayobert Beaton, MD 05/23/16 [phone]

## 2016-05-21 NOTE — ED Triage Notes (Signed)
C/o vaginal d/c and itching x 2 weeks-NAD-steady gait

## 2016-05-22 LAB — GC/CHLAMYDIA PROBE AMP (~~LOC~~) NOT AT ARMC
Chlamydia: NEGATIVE
Neisseria Gonorrhea: NEGATIVE

## 2016-06-11 ENCOUNTER — Emergency Department (HOSPITAL_BASED_OUTPATIENT_CLINIC_OR_DEPARTMENT_OTHER)
Admission: EM | Admit: 2016-06-11 | Discharge: 2016-06-11 | Disposition: A | Discharge status: Home / Self Care | Payer: Self-pay | Attending: Emergency Medicine | Admitting: Emergency Medicine

## 2016-06-11 ENCOUNTER — Encounter (HOSPITAL_BASED_OUTPATIENT_CLINIC_OR_DEPARTMENT_OTHER): Payer: Self-pay | Admitting: *Deleted

## 2016-06-11 DIAGNOSIS — F10230 Alcohol dependence with withdrawal, uncomplicated: Secondary | ICD-10-CM

## 2016-06-11 DIAGNOSIS — F10239 Alcohol dependence with withdrawal, unspecified: Secondary | ICD-10-CM | POA: Insufficient documentation

## 2016-06-11 DIAGNOSIS — F17210 Nicotine dependence, cigarettes, uncomplicated: Secondary | ICD-10-CM | POA: Insufficient documentation

## 2016-06-11 DIAGNOSIS — F10930 Alcohol use, unspecified with withdrawal, uncomplicated: Secondary | ICD-10-CM

## 2016-06-11 DIAGNOSIS — E039 Hypothyroidism, unspecified: Secondary | ICD-10-CM | POA: Insufficient documentation

## 2016-06-11 HISTORY — DX: Alcohol abuse, uncomplicated: F10.10

## 2016-06-11 MED ORDER — CHLORDIAZEPOXIDE HCL 25 MG PO CAPS
25.0000 mg | ORAL_CAPSULE | Freq: Once | ORAL | Status: DC
  Filled 2016-06-11: qty 1

## 2016-06-11 MED ORDER — CHLORDIAZEPOXIDE HCL 25 MG PO CAPS
50.0000 mg | ORAL_CAPSULE | Freq: Once | ORAL | Status: AC
  Administered 2016-06-11: 50 mg via ORAL
  Filled 2016-06-11: qty 2

## 2016-06-11 MED ORDER — ONDANSETRON 4 MG PO TBDP: 4.0000 mg | ORAL_TABLET | Freq: Three times a day (TID) | ORAL | 0 refills | Status: DC | PRN

## 2016-06-11 MED ORDER — CHLORDIAZEPOXIDE HCL 25 MG PO CAPS: ORAL_CAPSULE | ORAL | 0 refills | Status: DC

## 2016-06-11 MED ORDER — ONDANSETRON 4 MG PO TBDP
4.0000 mg | ORAL_TABLET | Freq: Once | ORAL | Status: AC
  Administered 2016-06-11: 4 mg via ORAL
  Filled 2016-06-11: qty 1
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
## 2016-06-11 NOTE — ED Notes (Signed)
EKG given to Dr. Knott 

## 2016-06-11 NOTE — ED Notes (Signed)
MD notified of CIWA score.

## 2016-06-11 NOTE — ED Provider Notes (Signed)
[facility]   CSN: [id number] Arrival date & time: 06/11/16  [id number]     History   Chief Complaint Chief Complaint  Patient presents with  . Withdrawal    ETOH    HPI Julie Conley is a 40 y.o. female.  The history is provided by the patient.  Alcohol Problem  This is a recurrent problem. Episode onset: 10 days ago. Episode frequency: every few months. The problem has been gradually worsening (withdrawal symptoms today after 10 days of drinking 2-3 750cc bottles of rum daily). Pertinent negatives include no chest pain, no abdominal pain and no shortness of breath. Associated symptoms comments: Tremulous, headache, feels unwell, palpitations. Exacerbated by: abrupt discontinuation of drinking. Nothing relieves the symptoms. She has tried nothing for the symptoms.    Past Medical History:  Diagnosis Date  . Alcohol abuse   . Anxiety   . Bimalleolar fracture of left ankle 04/2015  . Complication of anesthesia    hypotension with breast augmentation  . Depression   . GERD (gastroesophageal reflux disease)   . Headache    "once/week" (03/20/2016)  . Hypothyroidism 2010-2012   "stopped RX in 2012 when I didn't meet standards" (03/20/2016)  . Iron deficiency anemia   . Migraines    "once/month" (03/20/2016)  . Pneumonia "several times"  . Sciatic pain 04/12/2015   left    Patient Active Problem List   Diagnosis Date Noted  . Alcohol withdrawal (HCC) 04/24/2016  . Sinus tachycardia 04/24/2016  . Normocytic anemia 04/24/2016  . Alcoholic ketoacidosis 03/20/2016  . Chest pain 03/20/2016  . Alcohol consumption binge drinking 03/20/2016  . Depression 03/20/2016  . Gastritis 03/20/2016  . 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 Elevated lipase   . Hypokalemia 01/20/2016  . Hyperglycemia 01/20/2016  . Hyponatremia 01/20/2016  . Alcohol dependence with withdrawal with complication (HCC) 11/05/2014  . 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 ABDOMINOPLASTY  2015  . APPENDECTOMY  ~ 1986  . AUGMENTATION MAMMAPLASTY  2015  . CESAREAN SECTION  1996; 2000  . CYSTOSCOPY WITH HYDRODISTENSION AND BIOPSY  2010  . DILATION AND CURETTAGE OF UTERUS  2010  . ENDOMETRIAL ABLATION  2010  . FRACTURE SURGERY    . LAPAROSCOPIC CHOLECYSTECTOMY  2006  . LAPAROSCOPIC TUBAL LIGATION  2001   w/banding  . LAPAROTOMY  2011  . NASAL SEPTOPLASTY W/ TURBINOPLASTY  1991; 2008  . ORIF ANKLE FRACTURE Left 04/15/2015   Procedure: OPEN REDUCTION INTERNAL FIXATION (ORIF) LEFT BIMALLEOLAR AND SYNDESMOSIS ANKLE FRACTURE;  Surgeon: Sheral Apleyimothy D Murphy, MD;  Location: [facility];  Service: Orthopedics;  Laterality: Left;  . ROUX-EN-Y GASTRIC BYPASS  2009  . TONSILLECTOMY  ~ 1981  . VAGINAL HYSTERECTOMY  2013   complete    OB History    No data available       Home Medications    Prior to Admission medications   Medication Sig Start Date End Date Taking? Authorizing Provider  ferrous sulfate 325 (65 FE) MG EC tablet Take 1 tablet (325 mg total) by mouth 3 (three) times daily with meals. 04/26/16   Marinda ElkAbraham Feliz Ortiz, MD    Family History Family History  Problem Relation Age of Onset  . Drug abuse Brother   . Alcohol abuse Father   . Drug abuse Mother     Social History Social History  Substance Use Topics  . Smoking status: Current Every Day  Smoker    Packs/day: 0.50    Years: 20.00    Types: Cigarettes  . Smokeless tobacco: Never Used  . Alcohol use Yes     Allergies   Adhesive [tape]; Ambien [zolpidem tartrate]; Oxycodone; and Sulfa antibiotics   Review of Systems Review of Systems  Respiratory: Negative for shortness of breath.   Cardiovascular: Negative for chest pain.  Gastrointestinal: Negative for abdominal pain.  All other systems reviewed and are negative.    Physical Exam Updated Vital Signs BP 142/89 (BP Location: Left Arm)   Pulse 87   Temp 98.6 F  (37 C) (Oral)   Resp 12   Ht 5\' 4"  (1.626 m)   Wt 180 lb (81.6 kg)   SpO2 98%   BMI 30.90 kg/m   Physical Exam  Constitutional: She is oriented to person, place, and time. She appears well-developed and well-nourished. No distress.  Mildly tremulous  HENT:  Head: Normocephalic.  Nose: Nose normal.  Eyes: Conjunctivae are normal.  Neck: Neck supple. No tracheal deviation present.  Cardiovascular: Normal rate, regular rhythm and normal heart sounds.   Pulmonary/Chest: Effort normal and breath sounds normal. No respiratory distress.  Abdominal: Soft. She exhibits no distension.  Neurological: She is alert and oriented to person, place, and time. She has normal strength. No cranial nerve deficit. Coordination and gait normal. GCS eye subscore is 4. GCS verbal subscore is 5. GCS motor subscore is 6.  Skin: Skin is warm and dry.  Psychiatric: Her speech is normal. Her mood appears anxious. She is withdrawn. She is not actively hallucinating. She expresses no suicidal ideation.  Vitals reviewed.    ED Treatments / Results  Labs (all labs ordered are listed, but only abnormal results are displayed) Labs Reviewed - No data to display  EKG  EKG Interpretation  Date/Time:  Thursday June 11 2016 10:30:22 EDT Ventricular Rate:  79 PR Interval:    QRS Duration: 80 QT Interval:  385 QTC Calculation: 442 R Axis:   2 Text Interpretation:  Sinus rhythm Since last tracing rate slower Otherwise no significant change Confirmed by Sherree Shankman MD, Waynesha Rammel ([id number]) on 06/11/2016 10:53:22 AM       Radiology No results found.  Procedures Procedures (including critical care time)  Medications Ordered in ED Medications  ondansetron (ZOFRAN-ODT) disintegrating tablet 4 mg (4 mg Oral Given 06/11/16 1008)  chlordiazePOXIDE (LIBRIUM) capsule 50 mg (50 mg Oral Given 06/11/16 1049)  ondansetron (ZOFRAN-ODT) disintegrating tablet 4 mg (4 mg Oral Given 06/11/16 1037)     Initial Impression /  Assessment and Plan / ED Course  I have reviewed the triage vital signs and the nursing notes.  Pertinent labs & imaging results that were available during my care of the patient were reviewed by me and considered in my medical decision making (see chart for details).  Clinical Course    40 y.o. female presents with recurrent alcohol withdrawal. She went on a 10 day binge drinking 2-3 750cc bottles of rum daily. She appears to be in withdrawal but no arrhythmia, halllucinations, incoordination, or other severe features. Given dose of librium here with zofran to help with nausea. Pt appears reliable and wishes to discontinue alcohol use. Discharged with zofran and librium taper to help blunt withdrawal symptoms and return precautions discussed for worsening severity or other concerning signs.   Final Clinical Impressions(s) / ED Diagnoses   Final diagnoses:  Alcohol withdrawal syndrome without complication [facility])    New Prescriptions Discharge Medication List as of  06/11/2016 11:50 AM    START taking these medications   Details  chlordiazePOXIDE (LIBRIUM) 25 MG capsule 50mg  PO TID x 1D, then 25-50mg  PO BID X 1D, then 25-50mg  PO QD X 1D, Print         Lyndal Pulley, MD 06/11/16 1806

## 2016-06-11 NOTE — ED Notes (Signed)
Pt reports she thinks her binge was triggered by arguments with children and her late husband's birthday is coming up. Reports she was widowed three years ago.

## 2016-07-04 ENCOUNTER — Encounter (HOSPITAL_COMMUNITY): Payer: Self-pay | Admitting: Nurse Practitioner

## 2016-07-04 ENCOUNTER — Emergency Department (HOSPITAL_COMMUNITY)
Admission: EM | Admit: 2016-07-04 | Discharge: 2016-07-04 | Disposition: A | Discharge status: Home / Self Care | Payer: Self-pay | Attending: Emergency Medicine | Admitting: Emergency Medicine

## 2016-07-04 DIAGNOSIS — Y929 Unspecified place or not applicable: Secondary | ICD-10-CM | POA: Insufficient documentation

## 2016-07-04 DIAGNOSIS — Z79899 Other long term (current) drug therapy: Secondary | ICD-10-CM | POA: Insufficient documentation

## 2016-07-04 DIAGNOSIS — Y939 Activity, unspecified: Secondary | ICD-10-CM | POA: Insufficient documentation

## 2016-07-04 DIAGNOSIS — F1010 Alcohol abuse, uncomplicated: Secondary | ICD-10-CM | POA: Insufficient documentation

## 2016-07-04 DIAGNOSIS — IMO0002 Reserved for concepts with insufficient information to code with codable children: Secondary | ICD-10-CM

## 2016-07-04 DIAGNOSIS — E039 Hypothyroidism, unspecified: Secondary | ICD-10-CM | POA: Insufficient documentation

## 2016-07-04 DIAGNOSIS — F17210 Nicotine dependence, cigarettes, uncomplicated: Secondary | ICD-10-CM | POA: Insufficient documentation

## 2016-07-04 DIAGNOSIS — S0083XA Contusion of other part of head, initial encounter: Secondary | ICD-10-CM | POA: Insufficient documentation

## 2016-07-04 DIAGNOSIS — Y999 Unspecified external cause status: Secondary | ICD-10-CM | POA: Insufficient documentation

## 2016-07-04 LAB — CBC
HEMATOCRIT: 41.1 % (ref 36.0–46.0)
HEMOGLOBIN: 13.9 g/dL (ref 12.0–15.0)
MCH: 30.8 pg (ref 26.0–34.0)
MCHC: 33.8 g/dL (ref 30.0–36.0)
MCV: 91.1 fL (ref 78.0–100.0)
Platelets: 247 10*3/uL (ref 150–400)
RBC: 4.51 MIL/uL (ref 3.87–5.11)
RDW: 14.1 % (ref 11.5–15.5)
WBC: 6.5 10*3/uL (ref 4.0–10.5)
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
## 2016-07-04 NOTE — ED Notes (Signed)
Bed: [facility] Expected date:  Expected time:  Means of arrival:  Comments: 

## 2016-07-04 NOTE — ED Notes (Signed)
IVC paperwork has been rescinded.  

## 2016-08-09 ENCOUNTER — Encounter (HOSPITAL_COMMUNITY): Payer: Self-pay | Admitting: *Deleted

## 2016-08-09 ENCOUNTER — Inpatient Hospital Stay (HOSPITAL_COMMUNITY)
Admission: EM | Admit: 2016-08-09 | Discharge: 2016-08-11 | DRG: 897 | Disposition: A | Discharge status: Home / Self Care | Payer: Self-pay | Attending: Internal Medicine | Admitting: Internal Medicine

## 2016-08-09 DIAGNOSIS — F1010 Alcohol abuse, uncomplicated: Secondary | ICD-10-CM | POA: Diagnosis present

## 2016-08-09 DIAGNOSIS — Z813 Family history of other psychoactive substance abuse and dependence: Secondary | ICD-10-CM

## 2016-08-09 DIAGNOSIS — Z91048 Other nonmedicinal substance allergy status: Secondary | ICD-10-CM

## 2016-08-09 DIAGNOSIS — Z9884 Bariatric surgery status: Secondary | ICD-10-CM

## 2016-08-09 DIAGNOSIS — F10930 Alcohol use, unspecified with withdrawal, uncomplicated: Secondary | ICD-10-CM | POA: Diagnosis present

## 2016-08-09 DIAGNOSIS — F10230 Alcohol dependence with withdrawal, uncomplicated: Principal | ICD-10-CM | POA: Diagnosis present

## 2016-08-09 DIAGNOSIS — E876 Hypokalemia: Secondary | ICD-10-CM | POA: Diagnosis present

## 2016-08-09 DIAGNOSIS — F17210 Nicotine dependence, cigarettes, uncomplicated: Secondary | ICD-10-CM | POA: Diagnosis present

## 2016-08-09 DIAGNOSIS — Y905 Blood alcohol level of 100-119 mg/100 ml: Secondary | ICD-10-CM | POA: Diagnosis present

## 2016-08-09 DIAGNOSIS — Z811 Family history of alcohol abuse and dependence: Secondary | ICD-10-CM

## 2016-08-09 DIAGNOSIS — K911 Postgastric surgery syndromes: Secondary | ICD-10-CM | POA: Diagnosis present

## 2016-08-09 DIAGNOSIS — K2920 Alcoholic gastritis without bleeding: Secondary | ICD-10-CM | POA: Diagnosis present

## 2016-08-09 DIAGNOSIS — E871 Hypo-osmolality and hyponatremia: Secondary | ICD-10-CM | POA: Diagnosis present

## 2016-08-09 DIAGNOSIS — Z882 Allergy status to sulfonamides status: Secondary | ICD-10-CM

## 2016-08-09 DIAGNOSIS — Z888 Allergy status to other drugs, medicaments and biological substances status: Secondary | ICD-10-CM

## 2016-08-09 DIAGNOSIS — E872 Acidosis: Secondary | ICD-10-CM | POA: Diagnosis present

## 2016-08-09 DIAGNOSIS — R002 Palpitations: Secondary | ICD-10-CM | POA: Diagnosis present

## 2016-08-09 DIAGNOSIS — Z885 Allergy status to narcotic agent status: Secondary | ICD-10-CM

## 2016-08-09 DIAGNOSIS — F329 Major depressive disorder, single episode, unspecified: Secondary | ICD-10-CM | POA: Diagnosis present

## 2016-08-09 LAB — COMPREHENSIVE METABOLIC PANEL
ALK PHOS: 96 U/L (ref 38–126)
ALT: 40 U/L (ref 14–54)
AST: 50 U/L — ABNORMAL HIGH (ref 15–41)
Albumin: 4.8 g/dL (ref 3.5–5.0)
Anion gap: 22 — ABNORMAL HIGH (ref 5–15)
BILIRUBIN TOTAL: 0.5 mg/dL (ref 0.3–1.2)
BUN: <5 mg/dL — ABNORMAL LOW (ref 6–20)
CALCIUM: 9 mg/dL (ref 8.9–10.3)
CO2: 16 mmol/L — ABNORMAL LOW (ref 22–32)
CREATININE: 0.82 mg/dL (ref 0.44–1.00)
Chloride: 90 mmol/L — ABNORMAL LOW (ref 101–111)
Glucose, Bld: 106 mg/dL — ABNORMAL HIGH (ref 65–99)
Potassium: 3.5 mmol/L (ref 3.5–5.1)
Sodium: 128 mmol/L — ABNORMAL LOW (ref 135–145)
Total Protein: 7.9 g/dL (ref 6.5–8.1)

## 2016-08-09 LAB — CBC WITH DIFFERENTIAL/PLATELET
BASOS PCT: 0 %
Basophils Absolute: 0 10*3/uL (ref 0.0–0.1)
EOS PCT: 1 %
Eosinophils Absolute: 0.1 10*3/uL (ref 0.0–0.7)
HCT: 41.9 % (ref 36.0–46.0)
Hemoglobin: 14.8 g/dL (ref 12.0–15.0)
LYMPHS PCT: 28 %
Lymphs Abs: 2 10*3/uL (ref 0.7–4.0)
MCH: 30.9 pg (ref 26.0–34.0)
MCHC: 35.3 g/dL (ref 30.0–36.0)
MCV: 87.5 fL (ref 78.0–100.0)
MONO ABS: 0.3 10*3/uL (ref 0.1–1.0)
Monocytes Relative: 5 %
Neutro Abs: 4.8 10*3/uL (ref 1.7–7.7)
Neutrophils Relative %: 66 %
PLATELETS: 217 10*3/uL (ref 150–400)
RBC: 4.79 MIL/uL (ref 3.87–5.11)
RDW: 14.6 % (ref 11.5–15.5)
WBC: 7.2 10*3/uL (ref 4.0–10.5)

## 2016-08-09 LAB — URINALYSIS, ROUTINE W REFLEX MICROSCOPIC
BILIRUBIN URINE: NEGATIVE
GLUCOSE, UA: NEGATIVE mg/dL
Hgb urine dipstick: NEGATIVE
KETONES UR: NEGATIVE mg/dL
LEUKOCYTES UA: NEGATIVE
Nitrite: NEGATIVE
PROTEIN: NEGATIVE mg/dL
Specific Gravity, Urine: 1.007 (ref 1.005–1.030)
pH: 6.5 (ref 5.0–8.0)

## 2016-08-09 LAB — RAPID URINE DRUG SCREEN, HOSP PERFORMED
Amphetamines: NOT DETECTED
BARBITURATES: NOT DETECTED
Benzodiazepines: NOT DETECTED
COCAINE: NOT DETECTED
Opiates: NOT DETECTED
Tetrahydrocannabinol: NOT DETECTED

## 2016-08-09 LAB — LIPASE, BLOOD: LIPASE: 24 U/L (ref 11–51)

## 2016-08-09 LAB — ETHANOL: ALCOHOL ETHYL (B): 105 mg/dL — AB (ref <5)

## 2016-08-09 LAB — PREGNANCY, URINE: Preg Test, Ur: NEGATIVE

## 2016-08-09 MED ORDER — GI COCKTAIL ~~LOC~~
30.0000 mL | Freq: Once | ORAL | Status: AC
  Administered 2016-08-09: 30 mL via ORAL
  Filled 2016-08-09: qty 30

## 2016-08-09 MED ORDER — HYDROCODONE-ACETAMINOPHEN 5-325 MG PO TABS
2.0000 | ORAL_TABLET | Freq: Once | ORAL | Status: AC
  Administered 2016-08-10: 2 via ORAL
  Filled 2016-08-09: qty 2

## 2016-08-09 MED ORDER — LORAZEPAM 2 MG/ML IJ SOLN
1.0000 mg | Freq: Once | INTRAMUSCULAR | Status: AC
  Administered 2016-08-09: 1 mg via INTRAVENOUS
  Filled 2016-08-09: qty 1

## 2016-08-09 MED ORDER — LORAZEPAM 2 MG/ML IJ SOLN: 0.0000 mg | Freq: Two times a day (BID) | INTRAMUSCULAR | Status: DC

## 2016-08-09 MED ORDER — LORAZEPAM 2 MG/ML IJ SOLN
0.0000 mg | Freq: Four times a day (QID) | INTRAMUSCULAR | Status: DC
  Administered 2016-08-09 – 2016-08-10 (×2): 2 mg via INTRAVENOUS
  Filled 2016-08-09 (×2): qty 1

## 2016-08-09 MED ORDER — SODIUM CHLORIDE 0.9 % IV SOLN
INTRAVENOUS | Status: DC
  Administered 2016-08-09: via INTRAVENOUS

## 2016-08-09 MED ORDER — ONDANSETRON HCL 4 MG/2ML IJ SOLN
4.0000 mg | Freq: Once | INTRAMUSCULAR | Status: AC
  Administered 2016-08-09: 4 mg via INTRAVENOUS
  Filled 2016-08-09: qty 2

## 2016-08-09 MED ORDER — INFLUENZA VAC SPLIT QUAD 0.5 ML IM SUSY: 0.5000 mL | PREFILLED_SYRINGE | INTRAMUSCULAR | Status: DC

## 2016-08-09 MED ORDER — PNEUMOCOCCAL VAC POLYVALENT 25 MCG/0.5ML IJ INJ: 0.5000 mL | INJECTION | INTRAMUSCULAR | Status: DC

## 2016-08-09 MED ORDER — ONDANSETRON HCL 4 MG/2ML IJ SOLN
4.0000 mg | Freq: Four times a day (QID) | INTRAMUSCULAR | Status: DC | PRN
  Administered 2016-08-10: 4 mg via INTRAVENOUS
  Filled 2016-08-09: qty 2

## 2016-08-09 MED ORDER — SODIUM CHLORIDE 0.9 % IV BOLUS (SEPSIS)
1000.0000 mL | Freq: Once | INTRAVENOUS | Status: AC
  Administered 2016-08-09: 1000 mL via INTRAVENOUS

## 2016-08-09 NOTE — ED Notes (Signed)
Report attempted to be called and was told nurse was ready for report at this time.

## 2016-08-09 NOTE — ED Provider Notes (Signed)
[facility]-EMER[facility] DEPT Provider Note   CSN: [id number] Arrival date & time: 08/09/16  1752     History   Chief Complaint No chief complaint on file.   HPI Julie Conley is a 40 y.o. female.  HPI  Patient presents with concern of nausea, abdominal discomfort, unsteadiness, alcohol abuse. She notes over the past 9 days she has been drinking half pint of tequila twice daily. Today, the patient decided she needed to stop drinking alcohol, felt increasingly nauseous. Symptoms do not improve in spite of drinking a beer. No fever, chills, no confusion, no disorientation. Patient does have history of prior withdrawal complication. She has been hospitalized for withdrawal as well, last about one year ago.   Past Medical History:  Diagnosis Date  . Alcohol abuse   . Anxiety   . Bimalleolar fracture of left ankle 04/2015  . Complication of anesthesia    hypotension with breast augmentation  . Depression   . GERD (gastroesophageal reflux disease)   . Headache    "once/week" (03/20/2016)  . Hypothyroidism 2010-2012   "stopped RX in 2012 when I didn't meet standards" (03/20/2016)  . Iron deficiency anemia   . Migraines    "once/month" (03/20/2016)  . Pneumonia "several times"  . Sciatic pain 04/12/2015   left    Patient Active Problem List   Diagnosis Date Noted  . Alcohol withdrawal (HCC) 04/24/2016  . Sinus tachycardia 04/24/2016  . Normocytic anemia 04/24/2016  . Alcoholic ketoacidosis 03/20/2016  . Chest pain 03/20/2016  . Alcohol consumption binge drinking 03/20/2016  . Depression 03/20/2016  . Gastritis 03/20/2016  . 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 Elevated lipase   . Hypokalemia 01/20/2016  . Hyperglycemia 01/20/2016  . Hyponatremia 01/20/2016  . Alcohol dependence with withdrawal with complication (HCC) 11/05/2014  . 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 ABDOMINOPLASTY  2015  . APPENDECTOMY  ~  1986  . AUGMENTATION MAMMAPLASTY  2015  . CESAREAN SECTION  1996; 2000  . CYSTOSCOPY WITH HYDRODISTENSION AND BIOPSY  2010  . DILATION AND CURETTAGE OF UTERUS  2010  . ENDOMETRIAL ABLATION  2010  . FRACTURE SURGERY    . LAPAROSCOPIC CHOLECYSTECTOMY  2006  . LAPAROSCOPIC TUBAL LIGATION  2001   w/banding  . LAPAROTOMY  2011  . NASAL SEPTOPLASTY W/ TURBINOPLASTY  1991; 2008  . ORIF ANKLE FRACTURE Left 04/15/2015   Procedure: OPEN REDUCTION INTERNAL FIXATION (ORIF) LEFT BIMALLEOLAR AND SYNDESMOSIS ANKLE FRACTURE;  Surgeon: Sheral Apleyimothy D Murphy, MD;  Location: [facility];  Service: Orthopedics;  Laterality: Left;  . ROUX-EN-Y GASTRIC BYPASS  2009  . TONSILLECTOMY  ~ 1981  . VAGINAL HYSTERECTOMY  2013   complete    OB History    No data available       Home Medications    Prior to Admission medications   Medication Sig Start Date End Date Taking? Authorizing Provider  chlordiazePOXIDE (LIBRIUM) 25 MG capsule 50mg  PO TID x 1D, then 25-50mg  PO BID X 1D, then 25-50mg  PO QD X 1D Patient not taking: Reported on 07/04/2016 06/11/16   Lyndal Pulleyaniel Knott, MD  ferrous sulfate 325 (65 FE) MG EC tablet Take 1 tablet (325 mg total) by mouth 3 (three) times daily with meals. Patient not taking: Reported on 07/04/2016 04/26/16   Marinda ElkAbraham Feliz Ortiz, MD  Multiple Vitamin (MULTIVITAMIN WITH MINERALS) TABS tablet Take 1 tablet by mouth daily.    Historical Provider, MD  naproxen sodium (  ANAPROX) 220 MG tablet Take 220 mg by mouth 2 (two) times daily as needed (headache).    Historical Provider, MD  ondansetron (ZOFRAN ODT) 4 MG disintegrating tablet Take 1 tablet (4 mg total) by mouth every 8 (eight) hours as needed for nausea or vomiting. Patient not taking: Reported on 07/04/2016 06/11/16   Lyndal Pulley, MD  OVER THE COUNTER MEDICATION Take 1 capsule by mouth 2 (two) times daily. "Hair Skin and Nails" supplement    Historical Provider, MD  vitamin C (ASCORBIC ACID) 250 MG tablet Take 500 mg by  mouth daily.    Historical Provider, MD    Family History Family History  Problem Relation Age of Onset  . Drug abuse Brother   . Alcohol abuse Father   . Drug abuse Mother     Social History Social History  Substance Use Topics  . Smoking status: Current Every Day Smoker    Packs/day: 0.50    Years: 20.00    Types: Cigarettes  . Smokeless tobacco: Never Used  . Alcohol use Yes     Allergies   Adhesive [tape]; Ambien [zolpidem tartrate]; Oxycodone; and Sulfa antibiotics   Review of Systems Review of Systems  Constitutional:       Per HPI, otherwise negative  HENT:       Per HPI, otherwise negative  Respiratory:       Per HPI, otherwise negative  Cardiovascular:       Per HPI, otherwise negative  Gastrointestinal: Negative for vomiting.  Endocrine:       Negative aside from HPI  Genitourinary:       Neg aside from HPI   Musculoskeletal:       Per HPI, otherwise negative  Skin: Negative for wound.  Allergic/Immunologic: Negative for immunocompromised state.  Neurological: Negative for syncope.  Psychiatric/Behavioral: Positive for behavioral problems, confusion, dysphoric mood and sleep disturbance. The patient is nervous/anxious.      Physical Exam Updated Vital Signs BP 134/78   Pulse (!) 141   Temp 98.7 F (37.1 C) (Oral)   Resp 18   SpO2 97%   Physical Exam  Constitutional: She is oriented to person, place, and time. She appears well-developed and well-nourished.  Uncomfortable appearing 40 year old female awake, alert, speaking clearly  HENT:  Head: Normocephalic and atraumatic.  Eyes: Conjunctivae and EOM are normal.  Cardiovascular: Normal rate and regular rhythm.   Pulmonary/Chest: Effort normal and breath sounds normal. No stridor. No respiratory distress.  Abdominal: She exhibits no distension.  Minimal abdominal TTP, no guarding, rebound.  Musculoskeletal: She exhibits no edema.  Neurological: She is alert and oriented to person, place,  and time. No cranial nerve deficit.  Skin: Skin is warm and dry.  Psychiatric: Her mood appears anxious.  Nursing note and vitals reviewed.    ED Treatments / Results  Labs (all labs ordered are listed, but only abnormal results are displayed) Labs Reviewed  COMPREHENSIVE METABOLIC PANEL - Abnormal; Notable for the following:       Result Value   Sodium 128 (*)    Chloride 90 (*)    CO2 16 (*)    Glucose, Bld 106 (*)    BUN <5 (*)    AST 50 (*)    Anion gap 22 (*)    All other components within normal limits  ETHANOL - Abnormal; Notable for the following:    Alcohol, Ethyl (B) 105 (*)    All other components within normal limits  LIPASE, BLOOD  CBC WITH DIFFERENTIAL/PLATELET  URINALYSIS, ROUTINE W REFLEX MICROSCOPIC (NOT AT [facility])     Procedures Procedures (including critical care time)  Medications Ordered in ED Medications  LORazepam (ATIVAN) injection 0-4 mg (2 mg Intravenous Given 08/09/16 2049)    Followed by  LORazepam (ATIVAN) injection 0-4 mg (not administered)  sodium chloride 0.9 % bolus 1,000 mL (0 mLs Intravenous Stopped 08/09/16 2015)  LORazepam (ATIVAN) injection 1 mg (1 mg Intravenous Given 08/09/16 1917)  ondansetron (ZOFRAN) injection 4 mg (4 mg Intravenous Given 08/09/16 1929)  gi cocktail (Maalox,Lidocaine,Donnatal) (30 mLs Oral Given 08/09/16 2048)     Initial Impression / Assessment and Plan / ED Course  I have reviewed the triage vital signs and the nursing notes.  Pertinent labs & imaging results that were available during my care of the patient were reviewed by me and considered in my medical decision making (see chart for details).  Clinical Course     Patient remains nauseous, uncomfortable, in spite of multiple doses of Ativan. With consideration of withdrawal, and see was score of 21, patient has started our protocol required admission for further evaluation and management.   Final Clinical Impressions(s) / ED Diagnoses  Acute alcohol  withdrawal   Gerhard Munchobert Genella Bas, MD 08/09/16 [phone]

## 2016-08-09 NOTE — ED Triage Notes (Signed)
Pt reports she has been drinking tequila for the past 9 days straight.  Pt has a hx of binging on ETOH.  Pt reports that she is having trouble d/t this is around the anniversary of her husband's death and she recently found out her son eloped. Pt reports a headache, back pain, abd pain, and nausea.  Pt report she feels like her skin is tingling and she is seeing dark spots on her eyes.  Pt reports her last drink was 30 minutes prior to arrival to ED.

## 2016-08-09 NOTE — ED Notes (Signed)
Dr.Lockwood notified of CIWA and protocol to be ordered.

## 2016-08-10 ENCOUNTER — Encounter (HOSPITAL_COMMUNITY): Payer: Self-pay | Admitting: Internal Medicine

## 2016-08-10 DIAGNOSIS — F10230 Alcohol dependence with withdrawal, uncomplicated: Principal | ICD-10-CM

## 2016-08-10 LAB — BASIC METABOLIC PANEL
Anion gap: 10 (ref 5–15)
BUN: 5 mg/dL — AB (ref 6–20)
CALCIUM: 8.5 mg/dL — AB (ref 8.9–10.3)
CO2: 26 mmol/L (ref 22–32)
CREATININE: 0.77 mg/dL (ref 0.44–1.00)
Chloride: 95 mmol/L — ABNORMAL LOW (ref 101–111)
GFR calc Af Amer: >60 mL/min (ref >60)
Glucose, Bld: 141 mg/dL — ABNORMAL HIGH (ref 65–99)
Potassium: 3 mmol/L — ABNORMAL LOW (ref 3.5–5.1)
SODIUM: 131 mmol/L — AB (ref 135–145)

## 2016-08-10 LAB — COMPREHENSIVE METABOLIC PANEL
ALK PHOS: 72 U/L (ref 38–126)
ALT: 29 U/L (ref 14–54)
AST: 36 U/L (ref 15–41)
Albumin: 3.7 g/dL (ref 3.5–5.0)
Anion gap: 8 (ref 5–15)
BUN: 10 mg/dL (ref 6–20)
CALCIUM: 8.8 mg/dL — AB (ref 8.9–10.3)
CHLORIDE: 100 mmol/L — AB (ref 101–111)
CO2: 27 mmol/L (ref 22–32)
CREATININE: 0.81 mg/dL (ref 0.44–1.00)
Glucose, Bld: 107 mg/dL — ABNORMAL HIGH (ref 65–99)
Potassium: 4.1 mmol/L (ref 3.5–5.1)
Sodium: 135 mmol/L (ref 135–145)
Total Bilirubin: 0.8 mg/dL (ref 0.3–1.2)
Total Protein: 6.2 g/dL — ABNORMAL LOW (ref 6.5–8.1)

## 2016-08-10 LAB — CBC
HCT: 35 % — ABNORMAL LOW (ref 36.0–46.0)
Hemoglobin: 12.2 g/dL (ref 12.0–15.0)
MCH: 30.8 pg (ref 26.0–34.0)
MCHC: 34.9 g/dL (ref 30.0–36.0)
MCV: 88.4 fL (ref 78.0–100.0)
Platelets: 167 10*3/uL (ref 150–400)
RBC: 3.96 MIL/uL (ref 3.87–5.11)
RDW: 14.7 % (ref 11.5–15.5)
WBC: 5.6 10*3/uL (ref 4.0–10.5)

## 2016-08-10 LAB — HEPATIC FUNCTION PANEL
ALK PHOS: 78 U/L (ref 38–126)
ALT: 33 U/L (ref 14–54)
AST: 42 U/L — ABNORMAL HIGH (ref 15–41)
Albumin: 4.5 g/dL (ref 3.5–5.0)
BILIRUBIN INDIRECT: 1 mg/dL — AB (ref 0.3–0.9)
BILIRUBIN TOTAL: 1.1 mg/dL (ref 0.3–1.2)
Bilirubin, Direct: 0.1 mg/dL (ref 0.1–0.5)
Total Protein: 6.8 g/dL (ref 6.5–8.1)

## 2016-08-10 LAB — MAGNESIUM: Magnesium: 2 mg/dL (ref 1.7–2.4)

## 2016-08-10 LAB — SALICYLATE LEVEL: Salicylate Lvl: <7 mg/dL (ref 2.8–30.0)

## 2016-08-10 MED ORDER — LORAZEPAM 2 MG/ML IJ SOLN: 1.0000 mg | Freq: Four times a day (QID) | INTRAMUSCULAR | Status: DC | PRN

## 2016-08-10 MED ORDER — SODIUM CHLORIDE 0.9 % IV SOLN
INTRAVENOUS | Status: AC
  Administered 2016-08-10: 15:00:00 via INTRAVENOUS

## 2016-08-10 MED ORDER — LORAZEPAM 2 MG/ML IJ SOLN
0.0000 mg | Freq: Two times a day (BID) | INTRAMUSCULAR | Status: DC
Start: 2016-08-12 — End: 2016-08-11
  Filled 2016-08-10: qty 2

## 2016-08-10 MED ORDER — KETOROLAC TROMETHAMINE 15 MG/ML IJ SOLN: 15.0000 mg | Freq: Four times a day (QID) | INTRAMUSCULAR | Status: DC | PRN

## 2016-08-10 MED ORDER — THIAMINE HCL 100 MG/ML IJ SOLN: 100.0000 mg | Freq: Every day | INTRAMUSCULAR | Status: DC

## 2016-08-10 MED ORDER — FAMOTIDINE IN NACL 20-0.9 MG/50ML-% IV SOLN
20.0000 mg | Freq: Two times a day (BID) | INTRAVENOUS | Status: DC
  Administered 2016-08-10 (×3): 20 mg via INTRAVENOUS
  Filled 2016-08-10 (×4): qty 50

## 2016-08-10 MED ORDER — LORAZEPAM 1 MG PO TABS
1.0000 mg | ORAL_TABLET | Freq: Four times a day (QID) | ORAL | Status: DC | PRN
  Administered 2016-08-10 – 2016-08-11 (×3): 1 mg via ORAL
  Filled 2016-08-10 (×3): qty 1

## 2016-08-10 MED ORDER — LORAZEPAM 2 MG/ML IJ SOLN
0.0000 mg | Freq: Four times a day (QID) | INTRAMUSCULAR | Status: DC
  Administered 2016-08-10 – 2016-08-11 (×5): 2 mg via INTRAVENOUS
  Administered 2016-08-11: 4 mg via INTRAVENOUS
  Filled 2016-08-10: qty 1
  Filled 2016-08-10: qty 2
  Filled 2016-08-10 (×3): qty 1

## 2016-08-10 MED ORDER — ONDANSETRON HCL 4 MG/2ML IJ SOLN
4.0000 mg | Freq: Four times a day (QID) | INTRAMUSCULAR | Status: DC | PRN
  Administered 2016-08-10 – 2016-08-11 (×2): 4 mg via INTRAVENOUS
  Filled 2016-08-10 (×2): qty 2

## 2016-08-10 MED ORDER — ONDANSETRON HCL 4 MG PO TABS: 4.0000 mg | ORAL_TABLET | Freq: Four times a day (QID) | ORAL | Status: DC | PRN

## 2016-08-10 MED ORDER — ALUM & MAG HYDROXIDE-SIMETH 200-200-20 MG/5ML PO SUSP
30.0000 mL | ORAL | Status: DC | PRN
  Administered 2016-08-10 – 2016-08-11 (×3): 30 mL via ORAL
  Filled 2016-08-10 (×3): qty 30

## 2016-08-10 MED ORDER — ACETAMINOPHEN 650 MG RE SUPP: 650.0000 mg | Freq: Four times a day (QID) | RECTAL | Status: DC | PRN

## 2016-08-10 MED ORDER — FOLIC ACID 1 MG PO TABS
1.0000 mg | ORAL_TABLET | Freq: Every day | ORAL | Status: DC
  Administered 2016-08-10 – 2016-08-11 (×2): 1 mg via ORAL
  Filled 2016-08-10 (×2): qty 1

## 2016-08-10 MED ORDER — POTASSIUM CHLORIDE CRYS ER 20 MEQ PO TBCR
40.0000 meq | EXTENDED_RELEASE_TABLET | ORAL | Status: AC
  Administered 2016-08-10 (×2): 40 meq via ORAL
  Filled 2016-08-10 (×2): qty 2

## 2016-08-10 MED ORDER — ADULT MULTIVITAMIN W/MINERALS CH
1.0000 | ORAL_TABLET | Freq: Every day | ORAL | Status: DC
  Administered 2016-08-10 – 2016-08-11 (×2): 1 via ORAL
  Filled 2016-08-10 (×2): qty 1

## 2016-08-10 MED ORDER — VITAMIN B-1 100 MG PO TABS
100.0000 mg | ORAL_TABLET | Freq: Every day | ORAL | Status: DC
  Administered 2016-08-10 – 2016-08-11 (×2): 100 mg via ORAL
  Filled 2016-08-10 (×2): qty 1

## 2016-08-10 MED ORDER — ACETAMINOPHEN 325 MG PO TABS
650.0000 mg | ORAL_TABLET | Freq: Four times a day (QID) | ORAL | Status: DC | PRN
  Administered 2016-08-10: 650 mg via ORAL
  Filled 2016-08-10: qty 2

## 2016-08-10 NOTE — H&P (Signed)
History and Physical    Julie Conley ZOX:[id number] DOB: 12/14/1975 DOA: 08/09/2016  PCP: Pcp Not In System  Patient coming from: Home.  Chief Complaint: Tremors.  HPI: Julie Conley is a 40 y.o. female with history of alcoholism presents to the ER because of tremors and nausea. Patient states she drinks alcohol daily and yesterday morning she had her last drink and thought she would quit. Later in the evening patient started developing palpitations and tremors and also had some confusion. In the ER patient was found to be tremulous with labs showing low bicarbonate. Patient was tachycardic. Patient is being admitted for alcohol withdrawal. Patient in addition complains of epigastric discomfort lipase is normal with AST mildly elevated. Abdomen appears benign on exam. Patient also has been having some headache for the last 2 days. Patient appears nonfocal on exam.   ED Course: Patient was started on CIWA protocol.  Review of Systems: As per HPI, rest all negative.   Past Medical History:  Diagnosis Date  . Alcohol abuse   . Anxiety   . Bimalleolar fracture of left ankle 04/2015  . Complication of anesthesia    hypotension with breast augmentation  . Depression   . GERD (gastroesophageal reflux disease)   . Headache    "once/week" (03/20/2016)  . Hypothyroidism 2010-2012   "stopped RX in 2012 when I didn't meet standards" (03/20/2016)  . Iron deficiency anemia   . Migraines    "once/month" (03/20/2016)  . Pneumonia "several times"  . Sciatic pain 04/12/2015   left    Past Surgical History:  Procedure Laterality Date  . ABDOMINOPLASTY  2015  . APPENDECTOMY  ~ 1986  . AUGMENTATION MAMMAPLASTY  2015  . CESAREAN SECTION  1996; 2000  . CYSTOSCOPY WITH HYDRODISTENSION AND BIOPSY  2010  . DILATION AND CURETTAGE OF UTERUS  2010  . ENDOMETRIAL ABLATION  2010  . FRACTURE SURGERY    . LAPAROSCOPIC CHOLECYSTECTOMY  2006  . LAPAROSCOPIC TUBAL LIGATION  2001   w/banding  .  LAPAROTOMY  2011  . NASAL SEPTOPLASTY W/ TURBINOPLASTY  1991; 2008  . ORIF ANKLE FRACTURE Left 04/15/2015   Procedure: OPEN REDUCTION INTERNAL FIXATION (ORIF) LEFT BIMALLEOLAR AND SYNDESMOSIS ANKLE FRACTURE;  Surgeon: Sheral Apley, MD;  Location: [facility];  Service: Orthopedics;  Laterality: Left;  . ROUX-EN-Y GASTRIC BYPASS  2009  . TONSILLECTOMY  ~ 1981  . VAGINAL HYSTERECTOMY  2013   complete     reports that she has been smoking Cigarettes.  She has a 0.12 pack-year smoking history. She has never used smokeless tobacco. She reports that she drinks alcohol. She reports that she does not use drugs.  Allergies  Allergen Reactions  . Adhesive [Tape] Other (See Comments)    TEARS SKIN  . Ambien [Zolpidem Tartrate] Other (See Comments)    Delirium, disorientation  . Oxycodone     hallucinations  . Sulfa Antibiotics Itching    Family History  Problem Relation Age of Onset  . Drug abuse Brother   . Alcohol abuse Father   . Drug abuse Mother     Prior to Admission medications   Medication Sig Start Date End Date Taking? Authorizing Provider  Aspirin-Salicylamide-Caffeine (BC HEADACHE POWDER PO) Take 1 packet by mouth daily.   Yes Historical Provider, MD  Multiple Vitamin (MULTIVITAMIN WITH MINERALS) TABS tablet Take 1 tablet by mouth daily.   Yes Historical Provider, MD  naproxen sodium (ANAPROX) 220 MG tablet Take 440 mg by mouth 2 (  two) times daily as needed (headache).    Yes Historical Provider, MD  OVER THE COUNTER MEDICATION Take 1 capsule by mouth 2 (two) times daily. "Hair Skin and Nails" supplement   Yes Historical Provider, MD  chlordiazePOXIDE (LIBRIUM) 25 MG capsule  PO TID x 1D, then 25-50mg  PO BID X 1D, then 25-50mg  PO QD X 1D Patient not taking: Reported on 07/04/2016 06/11/16   Lyndal Pulley, MD  ferrous sulfate 325 (65 FE) MG EC tablet Take 1 tablet (325 mg total) by mouth 3 (three) times daily with meals. Patient not taking: Reported on  07/04/2016 04/26/16   Marinda Elk, MD  ondansetron (ZOFRAN ODT) 4 MG disintegrating tablet Take 1 tablet (4 mg total) by mouth every 8 (eight) hours as needed for nausea or vomiting. Patient not taking: Reported on 07/04/2016 06/11/16   Lyndal Pulley, MD    Physical Exam: Vitals:   08/09/16 2036 08/09/16 2145 08/09/16 2146 08/09/16 2242  BP: 120/85 147/85 147/85 134/83  Pulse: 108 118 118 (!) 109  Resp:  17    Temp:    98.6 F (37 C)  TempSrc:    Oral  SpO2:  98%  99%  Weight:      Height:          Constitutional: Moderately built and nourished. Vitals:   08/09/16 2036 08/09/16 2145 08/09/16 2146 08/09/16 2242  BP: 120/85 147/85 147/85 134/83  Pulse: 108 118 118 (!) 109  Resp:  17    Temp:    98.6 F (37 C)  TempSrc:    Oral  SpO2:  98%  99%  Weight:      Height:       Eyes: Anicteric no pallor. ENMT: No discharge from the ears eyes nose or mouth. Neck: No mass felt. No neck rigidity. Respiratory: No rhonchi or crepitations. Cardiovascular: S1-S2 heard. No murmurs appreciated. Tachycardic. Abdomen: Soft nontender bowel sounds present. No guarding or rigidity. Musculoskeletal: No edema. No joint effusion. Skin: No rash. Skin appears warm. Neurologic: Alert awake oriented to time place and person. Moves all extremities. Tremulous. Psychiatric: Appears normal. Normal exam.   Labs on Admission: I have personally reviewed following labs and imaging studies  CBC:  Recent Labs Lab 08/09/16 1915  WBC 7.2  NEUTROABS 4.8  HGB 14.8  HCT 41.9  MCV 87.5  PLT 217   Basic Metabolic Panel:  Recent Labs Lab 08/09/16 1915  NA 128*  K 3.5  CL 90*  CO2 16*  GLUCOSE 106*  BUN <5*  CREATININE 0.82  CALCIUM 9.0   GFR: Estimated Creatinine Clearance: 93 mL/min (by C-G formula based on SCr of 0.82 mg/dL). Liver Function Tests:  Recent Labs Lab 08/09/16 1915  AST 50*  ALT 40  ALKPHOS 96  BILITOT 0.5  PROT 7.9  ALBUMIN 4.8    Recent Labs Lab  08/09/16 1915  LIPASE 24   No results for input(s): AMMONIA in the last 168 hours. Coagulation Profile: No results for input(s): INR, PROTIME in the last 168 hours. Cardiac Enzymes: No results for input(s): CKTOTAL, CKMB, CKMBINDEX, TROPONINI in the last 168 hours. BNP (last 3 results) No results for input(s): PROBNP in the last 8760 hours. HbA1C: No results for input(s): HGBA1C in the last 72 hours. CBG: No results for input(s): GLUCAP in the last 168 hours. Lipid Profile: No results for input(s): CHOL, HDL, LDLCALC, TRIG, CHOLHDL, LDLDIRECT in the last 72 hours. Thyroid Function Tests: No results for input(s): TSH, T4TOTAL, FREET4, T3FREE, THYROIDAB  in the last 72 hours. Anemia Panel: No results for input(s): VITAMINB12, FOLATE, FERRITIN, TIBC, IRON, RETICCTPCT in the last 72 hours. Urine analysis:    Component Value Date/Time   COLORURINE YELLOW 08/09/2016 2022   APPEARANCEUR CLEAR 08/09/2016 2022   LABSPEC 1.007 08/09/2016 2022   PHURINE 6.5 08/09/2016 2022   GLUCOSEU NEGATIVE 08/09/2016 2022   HGBUR NEGATIVE 08/09/2016 2022   BILIRUBINUR NEGATIVE 08/09/2016 2022   KETONESUR NEGATIVE 08/09/2016 2022   PROTEINUR NEGATIVE 08/09/2016 2022   UROBILINOGEN 1.0 02/26/2013 1916   NITRITE NEGATIVE 08/09/2016 2022   LEUKOCYTESUR NEGATIVE 08/09/2016 2022   Sepsis Labs: @LABRCNTIP (procalcitonin:4,lacticidven:4) )No results found for this or any previous visit (from the past 240 hour(s)).   Radiological Exams on Admission: No results found.  EKG: Independently reviewed. Normal sinus rhythm.  Assessment/Plan Principal Problem:   Alcohol withdrawal syndrome without complication (HCC)    1. Alcohol withdrawal - patient advised to quit alcohol abuse. Patient is placed on CIWA protocol. Closely monitor in telemetry. 2. Epigastric discomfort - follow LFTs. AST is mildly elevated. Lipase is normal. Abdomen appears benign on exam. 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 Closely monitor. 3. Headache - denies any fall or loss of consciousness. Appears nonfocal. Closely observe for now. When necessary Toradol.   DVT prophylaxis: SCDs. Code Status: Full code.  Family Communication: Discussed with patient.  Disposition Plan: Home.  Consults called: None.  Admission status: Inpatient.    Eduard ClosKAKRAKANDY,Joshva Labreck N. MD [facility] Pager [phone].  If 7PM-7AM, please contact night-coverage [email or web address] Password [email or web address]  08/10/2016, 12:31 AM

## 2016-08-10 NOTE — Progress Notes (Addendum)
[facility]  H&P reviewed and patient examined. She has been drinking for 9 days straight before trying to quit. Her tremor is better today. Has burning in abdomen and a headache.    Assessment/Plan  1. Alcohol withdrawal - due to trying to quit drinking- she states she does not need resources to help her quit as she has friends at [employer]- - cont on CIWA protocol.   2. Hyponatremia- Na+ 128>> 130 - cont NS IV 3. Hypokalemia- replace- Mg normal 4. Epigastric discomfort - AST is mildly elevated. Lipase is normal. 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  5. Headache - denies any fall or loss of consciousness. Appears nonfocal. Tylenol PRN  Calvert CantorSaima Dewie Ahart, MD Pager: [email or web address]

## 2016-08-11 DIAGNOSIS — E876 Hypokalemia: Secondary | ICD-10-CM

## 2016-08-11 DIAGNOSIS — Z9889 Other specified postprocedural states: Secondary | ICD-10-CM

## 2016-08-11 DIAGNOSIS — E871 Hypo-osmolality and hyponatremia: Secondary | ICD-10-CM

## 2016-08-11 DIAGNOSIS — K2920 Alcoholic gastritis without bleeding: Secondary | ICD-10-CM

## 2016-08-11 DIAGNOSIS — F1010 Alcohol abuse, uncomplicated: Secondary | ICD-10-CM

## 2016-08-11 LAB — BASIC METABOLIC PANEL
Anion gap: 6 (ref 5–15)
BUN: 8 mg/dL (ref 6–20)
CALCIUM: 8.6 mg/dL — AB (ref 8.9–10.3)
CO2: 29 mmol/L (ref 22–32)
CREATININE: 0.6 mg/dL (ref 0.44–1.00)
Chloride: 102 mmol/L (ref 101–111)
Glucose, Bld: 102 mg/dL — ABNORMAL HIGH (ref 65–99)
Potassium: 3.5 mmol/L (ref 3.5–5.1)
SODIUM: 137 mmol/L (ref 135–145)

## 2016-08-11 LAB — MAGNESIUM: MAGNESIUM: 2 mg/dL (ref 1.7–2.4)

## 2016-08-11 MED ORDER — FAMOTIDINE 20 MG PO TABS: 20.0000 mg | ORAL_TABLET | Freq: Two times a day (BID) | ORAL | Status: DC

## 2016-08-11 MED ORDER — DIPHENOXYLATE-ATROPINE 2.5-0.025 MG/5ML PO LIQD: 5.0000 mL | Freq: Four times a day (QID) | ORAL | Status: DC | PRN

## 2016-08-11 MED ORDER — GI COCKTAIL ~~LOC~~
30.0000 mL | Freq: Three times a day (TID) | ORAL | Status: DC | PRN
  Administered 2016-08-11: 30 mL via ORAL
  Filled 2016-08-11: qty 30

## 2016-08-11 MED ORDER — FAMOTIDINE 20 MG PO TABS: 20.0000 mg | ORAL_TABLET | Freq: Two times a day (BID) | ORAL | 0 refills | Status: DC

## 2016-08-11 MED ORDER — ALUM & MAG HYDROXIDE-SIMETH 200-200-20 MG/5ML PO SUSP: 30.0000 mL | ORAL | 0 refills | Status: DC | PRN

## 2016-08-11 MED ORDER — DIPHENOXYLATE-ATROPINE 2.5-0.025 MG PO TABS: 1.0000 | ORAL_TABLET | Freq: Four times a day (QID) | ORAL | Status: DC | PRN

## 2016-08-11 MED ORDER — FAMOTIDINE IN NACL 20-0.9 MG/50ML-% IV SOLN
20.0000 mg | Freq: Two times a day (BID) | INTRAVENOUS | Status: DC
  Administered 2016-08-11: 20 mg via INTRAVENOUS
  Filled 2016-08-11 (×2): qty 50

## 2016-08-11 NOTE — Progress Notes (Addendum)
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 H/o binge drinking.  Patient states she has been on a drinking bing for about 9 days and tried to stop a day before being admitted. Later in the evening patient started developing palpitations and tremors and also had some confusion. In the ER patient was found to be tremulous. ETOH level on admission was still elevated at 105. Also complained of abdominal burning, loose stools and headache.  ER: Na 128, Bicarb 16, anion gap 22 Last admitted for etoh withdrawal in august of this year.    Subjective: Continues to feel shaky but less so. Feels head is foggy. Very weak when getting up. Skin crawling. Mild nausea - would like GI cocktail with Lidocaine ordered for abdominal discomfort. She has had it before and it helped.   Assessment & Plan:   Alcohol withdrawal? After a 9 day binge - she states she does not need resources to help her quit as she has friends at [employer]- - cont on CIWA protocol.    Hyponatremia- Na+ 128 on admission- improving - cont NS IV  Hypokalemia- replaced - Mg normal  Anion gap metabolic acidosis - starvation keto acidosis?  - has resolved   Epigastric discomfort- AST is mildly elevated. Lipase is normal. 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 Tylenol PRN   DVT prophylaxis: SCDs Code Status: full  Family  Communication:  Disposition Plan: home in 1-2 days Consultants:    Procedures:    Antimicrobials:  Anti-infectives    None       Objective: Vitals:   08/10/16 1210 08/10/16 1755 08/11/16 0002 08/11/16 0452  BP: 133/82 121/86 128/87 (!) 128/93  Pulse: 88 98 89 84  Resp: 15 15 18 16   Temp: 98.1 F (36.7 C) 98.4 F (36.9 C) 98.4 F (36.9 C) 98.1 F (36.7 C)  TempSrc: Oral Oral Oral Oral  SpO2: 97% 100% 99% 100%  Weight:      Height:        Intake/Output Summary (Last 24 hours) at 08/11/16 1248 Last data filed at 08/11/16 0800  Gross per 24 hour  Intake             1370 ml  Output                0 ml  Net             1370 ml   Filed Weights   08/09/16 2015  Weight: 79.4 kg (175 lb)    Examination: General exam: Appears comfortable  HEENT: PERRLA, oral mucosa moist, no sclera icterus or thrush Respiratory system: Clear to auscultation. Respiratory effort normal. Cardiovascular system: S1 & S2 heard, RRR.  No murmurs  Gastrointestinal system: Abdomen soft, non-tender, nondistended. Normal bowel sound. No organomegaly Central nervous system: Alert and oriented. No focal neurological  deficits. Extremities: No cyanosis, clubbing or edema Skin: No rashes or ulcers Psychiatry:  Mood & affect appropriate.     Data Reviewed: I have personally reviewed following labs and imaging studies  CBC:  Recent Labs Lab 08/09/16 1915 08/10/16 0058  WBC 7.2 5.6  NEUTROABS 4.8  --   HGB 14.8 12.2  HCT 41.9 35.0*  MCV 87.5 88.4  PLT 217 167   Basic Metabolic Panel:  Recent Labs Lab 08/09/16 1915 08/10/16 0058 08/10/16 1345 08/11/16 0550  NA 128* 131* 135  --   K 3.5 3.0* 4.1  --   CL 90* 95* 100*  --   CO2 16* 26 27  --   GLUCOSE 106* 141* 107*  --   BUN <5* 5* 10  --   CREATININE 0.82 0.77 0.81  --   CALCIUM 9.0 8.5* 8.8*  --   MG  --  2.0  --  2.0   GFR: Estimated Creatinine Clearance: 94.2 mL/min (by C-G formula based on SCr of 0.81 mg/dL). Liver  Function Tests:  Recent Labs Lab 08/09/16 1915 08/10/16 0058 08/10/16 1345  AST 50* 42* 36  ALT 40 33 29  ALKPHOS 96 78 72  BILITOT 0.5 1.1 0.8  PROT 7.9 6.8 6.2*  ALBUMIN 4.8 4.5 3.7    Recent Labs Lab 08/09/16 1915  LIPASE 24   No results for input(s): AMMONIA in the last 168 hours. Coagulation Profile: No results for input(s): INR, PROTIME in the last 168 hours. Cardiac Enzymes: No results for input(s): CKTOTAL, CKMB, CKMBINDEX, TROPONINI in the last 168 hours. BNP (last 3 results) No results for input(s): PROBNP in the last 8760 hours. HbA1C: No results for input(s): HGBA1C in the last 72 hours. CBG: No results for input(s): GLUCAP in the last 168 hours. Lipid Profile: No results for input(s): CHOL, HDL, LDLCALC, TRIG, CHOLHDL, LDLDIRECT in the last 72 hours. Thyroid Function Tests: No results for input(s): TSH, T4TOTAL, FREET4, T3FREE, THYROIDAB in the last 72 hours. Anemia Panel: No results for input(s): VITAMINB12, FOLATE, FERRITIN, TIBC, IRON, RETICCTPCT in the last 72 hours. Urine analysis:    Component Value Date/Time   COLORURINE YELLOW 08/09/2016 2022   APPEARANCEUR CLEAR 08/09/2016 2022   LABSPEC 1.007 08/09/2016 2022   PHURINE 6.5 08/09/2016 2022   GLUCOSEU NEGATIVE 08/09/2016 2022   HGBUR NEGATIVE 08/09/2016 2022   BILIRUBINUR NEGATIVE 08/09/2016 2022   KETONESUR NEGATIVE 08/09/2016 2022   PROTEINUR NEGATIVE 08/09/2016 2022   UROBILINOGEN 1.0 02/26/2013 1916   NITRITE NEGATIVE 08/09/2016 2022   LEUKOCYTESUR NEGATIVE 08/09/2016 2022   Sepsis Labs: @LABRCNTIP (procalcitonin:4,lacticidven:4) )No results found for this or any previous visit (from the past 240 hour(s)).       Radiology Studies: No results found.    Scheduled Meds: . famotidine (PEPCID) IV  20 mg Intravenous Q12H  . folic acid  1 mg Oral Daily  . LORazepam  0-4 mg Intravenous Q6H   Followed by  . [START ON 08/12/2016] LORazepam  0-4 mg Intravenous Q12H  . multivitamin  with minerals  1 tablet Oral Daily  . thiamine  100 mg Oral Daily   Or  . thiamine  100 mg Intravenous Daily   Continuous Infusions:   LOS: 2 days    Time spent in minutes: 35    Tajanay Hurley, MD [facility] Pager: [email or web address] Password [email or web address] 08/11/2016, 12:48 PM

## 2016-08-11 NOTE — Discharge Summary (Signed)
Physician Discharge Summary  Aleeyah Bensen ZOX:[id number] DOB: 08/27/1976 DOA: 08/09/2016  PCP: Pcp Not In System  Admit date: 08/09/2016 Discharge date: 08/11/2016  Admitted From: home  Disposition:  home   Recommendations for Outpatient Follow-up:  1. 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 H/o binge drinking.  Patient states she has been on a drinking bing for about 9 days and tried to stop a day before being admitted. Later in theevening patient started developing palpitations and tremors and also hadsome confusion. In the ER patient was found to be tremulous. ETOH level on admission was still elevated at 105. Also complained of abdominal burning, loose stools and headache.  ER: Na 128, Bicarb 16, anion gap 22 Last admitted for etoh withdrawal in august of this year.   Hospital Course:  Alcohol withdrawal? After a 9 day binge - she states she does not need resources to help her quit as she has friends at [employer]- - cont on CIWA protocol.   Hyponatremia- Na+ 128 on admission- improved with NS IV  Hypokalemia-replaced - Mg normal  Anion gap metabolic acidosis - starvation keto acidosis?  - has resolved   Epigastric discomfort- AST is mildly elevated. Lipase is normal. 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 Tylenol PRN    Discharge Instructions  Discharge Instructions    Diet - low sodium heart healthy    Complete by:  As directed    Increase activity slowly    Complete by:  As directed        Medication List    STOP taking these medications   BC HEADACHE POWDER PO   naproxen sodium 220 MG tablet Commonly known as:  ANAPROX     TAKE these medications   alum & mag hydroxide-simeth 200-200-20 MG/5ML suspension Commonly known as:  MAALOX/MYLANTA Take 30 mLs by mouth every 4 (four) hours as needed for indigestion or heartburn.   famotidine 20 MG tablet Commonly known as:  PEPCID Take 1 tablet (20 mg total) by mouth 2 (two) times daily.   ferrous sulfate 325 (65 FE) MG EC tablet Take 1 tablet (325 mg total) by mouth 3 (three) times daily with meals.   multivitamin with minerals Tabs tablet Take 1 tablet by mouth daily.   ondansetron 4 MG disintegrating tablet Commonly known as:  ZOFRAN ODT Take 1 tablet (4 mg total) by mouth every 8 (eight) hours as needed for nausea or vomiting.   OVER THE COUNTER MEDICATION Take 1  capsule by mouth 2 (two) times daily. "Hair Skin and Nails" supplement       Allergies  Allergen Reactions  . Adhesive [Tape] Other (See Comments)    TEARS SKIN  . Ambien [Zolpidem Tartrate] Other (See Comments)    Delirium, disorientation  . Oxycodone     hallucinations  . Sulfa Antibiotics Itching     Procedures/Studies:    No results found.     Discharge Exam: Vitals:   08/11/16 1245 08/11/16 1354  BP: 130/80 128/77  Pulse: 87 (!) 110  Resp: 18 20  Temp: 98 F (36.7 C) 98.3 F (36.8 C)   Vitals:   08/11/16 0002  08/11/16 0452 08/11/16 1245 08/11/16 1354  BP: 128/87 (!) 128/93 130/80 128/77  Pulse: 89 84 87 (!) 110  Resp: 18 16 18 20   Temp: 98.4 F (36.9 C) 98.1 F (36.7 C) 98 F (36.7 C) 98.3 F (36.8 C)  TempSrc: Oral Oral  Oral  SpO2: 99% 100% 96% 98%  Weight:      Height:        General: Pt is alert, awake, not in acute distress Cardiovascular: RRR, S1/S2 +, no rubs, no gallops Respiratory: CTA bilaterally, no wheezing, no rhonchi Abdominal: Soft, NT, ND, bowel sounds + Extremities: no edema, no cyanosis    The results of significant diagnostics from this hospitalization (including imaging, microbiology, ancillary and laboratory) are listed below for reference.     Microbiology: No results found for this or any previous visit (from the past 240 hour(s)).   Labs: BNP (last 3 results) No results for input(s): BNP in the last 8760 hours. Basic Metabolic Panel:  Recent Labs Lab 08/09/16 1915 08/10/16 0058 08/10/16 1345 08/11/16 0546 08/11/16 0550  NA 128* 131* 135 137  --   K 3.5 3.0* 4.1 3.5  --   CL 90* 95* 100* 102  --   CO2 16* 26 27 29   --   GLUCOSE 106* 141* 107* 102*  --   BUN <5* 5* 10 8  --   CREATININE 0.82 0.77 0.81 0.60  --   CALCIUM 9.0 8.5* 8.8* 8.6*  --   MG  --  2.0  --   --  2.0   Liver Function Tests:  Recent Labs Lab 08/09/16 1915 08/10/16 0058 08/10/16 1345  AST 50* 42* 36  ALT 40 33 29  ALKPHOS 96 78 72  BILITOT 0.5 1.1 0.8  PROT 7.9 6.8 6.2*  ALBUMIN 4.8 4.5 3.7    Recent Labs Lab 08/09/16 1915  LIPASE 24   No results for input(s): AMMONIA in the last 168 hours. CBC:  Recent Labs Lab 08/09/16 1915 08/10/16 0058  WBC 7.2 5.6  NEUTROABS 4.8  --   HGB 14.8 12.2  HCT 41.9 35.0*  MCV 87.5 88.4  PLT 217 167   Cardiac Enzymes: No results for input(s): CKTOTAL, CKMB, CKMBINDEX, TROPONINI in the last 168 hours. BNP: Invalid input(s): POCBNP CBG: No results for input(s): GLUCAP in the last 168 hours. D-Dimer No results for  input(s): DDIMER in the last 72 hours. Hgb A1c No results for input(s): HGBA1C in the last 72 hours. Lipid Profile No results for input(s): CHOL, HDL, LDLCALC, TRIG, CHOLHDL, LDLDIRECT in the last 72 hours. Thyroid function studies No results for input(s): TSH, T4TOTAL, T3FREE, THYROIDAB in the last 72 hours.  Invalid input(s): FREET3 Anemia work up No results for input(s): VITAMINB12, FOLATE, FERRITIN, TIBC, IRON, RETICCTPCT in the last 72 hours. Urinalysis  Component Value Date/Time   COLORURINE YELLOW 08/09/2016 2022   APPEARANCEUR CLEAR 08/09/2016 2022   LABSPEC 1.007 08/09/2016 2022   PHURINE 6.5 08/09/2016 2022   GLUCOSEU NEGATIVE 08/09/2016 2022   HGBUR NEGATIVE 08/09/2016 2022   BILIRUBINUR NEGATIVE 08/09/2016 2022   KETONESUR NEGATIVE 08/09/2016 2022   PROTEINUR NEGATIVE 08/09/2016 2022   UROBILINOGEN 1.0 02/26/2013 1916   NITRITE NEGATIVE 08/09/2016 2022   LEUKOCYTESUR NEGATIVE 08/09/2016 2022   Sepsis Labs Invalid input(s): PROCALCITONIN,  WBC,  LACTICIDVEN Microbiology No results found for this or any previous visit (from the past 240 hour(s)).   Time coordinating discharge: Over 30 minutes  SIGNED:   Calvert CantorIZWAN,Arelie Kuzel, MD  [facility] 08/11/2016, 6:09 PM Pager   If 7PM-7AM, please contact night-coverage [email or web address] Password [email or web address]

## 2016-08-11 NOTE — Progress Notes (Signed)
PHARMACIST - PHYSICIAN COMMUNICATION  DR:   Butler Denmarkizwan  CONCERNING: IV to Oral Route Change Policy  RECOMMENDATION: This patient is receiving Famotidine by the intravenous route.  Based on criteria approved by the Pharmacy and Therapeutics Committee, the intravenous medication(s) is/are being converted to the equivalent oral dose form(s).   DESCRIPTION: These criteria include:  The patient is eating (either orally or via tube) and/or has been taking other orally administered medications for a least 24 hours  The patient has no evidence of active gastrointestinal bleeding or impaired GI absorption (gastrectomy, short bowel, patient on TNA or NPO).  If you have questions about this conversion, please contact the Pharmacy Department  []   [phone]( [phone] )  [facility] []   [phone]( [phone] )  [facility] []   [phone]( [phone] )  [facility] []   [phone]( [phone] )  [facility] [x]   [phone]( [phone] )  [facility]   Otho BellowsGreen, Berdia Lachman L, [facility] Asc[facility] 08/11/2016 10:40 AM

## 2016-08-11 NOTE — Progress Notes (Signed)
Pt discharged from the unit via wheelchair. Discharge instructions were reveiwed with the pt. No questions or concerns at this time.  Julie Conley W Julie Lundblad, RN

## 2016-11-05 ENCOUNTER — Emergency Department (HOSPITAL_COMMUNITY)
Admission: EM | Admit: 2016-11-05 | Discharge: 2016-11-05 | Disposition: A | Discharge status: Home / Self Care | Payer: Self-pay | Attending: Emergency Medicine | Admitting: Emergency Medicine

## 2016-11-05 DIAGNOSIS — E039 Hypothyroidism, unspecified: Secondary | ICD-10-CM | POA: Insufficient documentation

## 2016-11-05 DIAGNOSIS — F17210 Nicotine dependence, cigarettes, uncomplicated: Secondary | ICD-10-CM | POA: Insufficient documentation

## 2016-11-05 DIAGNOSIS — F10230 Alcohol dependence with withdrawal, uncomplicated: Secondary | ICD-10-CM | POA: Insufficient documentation

## 2016-11-05 DIAGNOSIS — Z79899 Other long term (current) drug therapy: Secondary | ICD-10-CM | POA: Insufficient documentation

## 2016-11-05 LAB — URINALYSIS, ROUTINE W REFLEX MICROSCOPIC
BILIRUBIN URINE: NEGATIVE
Glucose, UA: NEGATIVE mg/dL
KETONES UR: NEGATIVE mg/dL
Nitrite: NEGATIVE
PROTEIN: 100 mg/dL — AB
Specific Gravity, Urine: 1.013 (ref 1.005–1.030)
pH: 6 (ref 5.0–8.0)

## 2016-11-05 LAB — COMPREHENSIVE METABOLIC PANEL
ALT: 35 U/L (ref 14–54)
AST: 66 U/L — AB (ref 15–41)
Albumin: 4 g/dL (ref 3.5–5.0)
Alkaline Phosphatase: 101 U/L (ref 38–126)
Anion gap: 11 (ref 5–15)
BUN: 6 mg/dL (ref 6–20)
CHLORIDE: 97 mmol/L — AB (ref 101–111)
CO2: 29 mmol/L (ref 22–32)
Calcium: 9.2 mg/dL (ref 8.9–10.3)
Creatinine, Ser: 0.72 mg/dL (ref 0.44–1.00)
GFR calc Af Amer: >60 mL/min (ref >60)
Glucose, Bld: 99 mg/dL (ref 65–99)
POTASSIUM: 3.8 mmol/L (ref 3.5–5.1)
SODIUM: 137 mmol/L (ref 135–145)
Total Bilirubin: 0.8 mg/dL (ref 0.3–1.2)
Total Protein: 7.2 g/dL (ref 6.5–8.1)

## 2016-11-05 LAB — MAGNESIUM: MAGNESIUM: 1.9 mg/dL (ref 1.7–2.4)

## 2016-11-05 LAB — RAPID URINE DRUG SCREEN, HOSP PERFORMED
Amphetamines: NOT DETECTED
BARBITURATES: NOT DETECTED
BENZODIAZEPINES: POSITIVE — AB
COCAINE: NOT DETECTED
Opiates: NOT DETECTED
TETRAHYDROCANNABINOL: NOT DETECTED

## 2016-11-05 LAB — CBC
HEMATOCRIT: 39.5 % (ref 36.0–46.0)
Hemoglobin: 13.5 g/dL (ref 12.0–15.0)
MCH: 31 pg (ref 26.0–34.0)
MCHC: 34.2 g/dL (ref 30.0–36.0)
MCV: 90.6 fL (ref 78.0–100.0)
PLATELETS: 122 10*3/uL — AB (ref 150–400)
RBC: 4.36 MIL/uL (ref 3.87–5.11)
RDW: 15 % (ref 11.5–15.5)
WBC: 5.6 10*3/uL (ref 4.0–10.5)

## 2016-11-05 LAB — ETHANOL: Alcohol, Ethyl (B): 5 mg/dL — ABNORMAL HIGH (ref <5)

## 2016-11-05 LAB — LIPASE, BLOOD: Lipase: 23 U/L (ref 11–51)

## 2016-11-05 LAB — PREGNANCY, URINE: PREG TEST UR: NEGATIVE

## 2016-11-05 MED ORDER — VITAMIN B-1 100 MG PO TABS: 100.0000 mg | ORAL_TABLET | Freq: Every day | ORAL | 0 refills | Status: AC

## 2016-11-05 MED ORDER — CEPHALEXIN 500 MG PO CAPS: 500.0000 mg | ORAL_CAPSULE | Freq: Four times a day (QID) | ORAL | 0 refills | Status: AC

## 2016-11-05 MED ORDER — VITAMIN B-1 100 MG PO TABS
100.0000 mg | ORAL_TABLET | Freq: Once | ORAL | Status: AC
  Administered 2016-11-05: 100 mg via ORAL
  Filled 2016-11-05: qty 1

## 2016-11-05 MED ORDER — LORAZEPAM 1 MG PO TABS
0.0000 mg | ORAL_TABLET | Freq: Four times a day (QID) | ORAL | Status: DC
  Administered 2016-11-05: 1 mg via ORAL
  Filled 2016-11-05: qty 1

## 2016-11-05 MED ORDER — ONDANSETRON 4 MG PO TBDP: 4.0000 mg | ORAL_TABLET | Freq: Four times a day (QID) | ORAL | Status: DC | PRN

## 2016-11-05 MED ORDER — LORAZEPAM 1 MG PO TABS
2.0000 mg | ORAL_TABLET | Freq: Once | ORAL | Status: AC
  Administered 2016-11-05: 2 mg via ORAL
  Filled 2016-11-05: qty 2

## 2016-11-05 MED ORDER — ONDANSETRON 4 MG PO TBDP
4.0000 mg | ORAL_TABLET | Freq: Once | ORAL | Status: AC
  Administered 2016-11-05: 4 mg via ORAL
  Filled 2016-11-05: qty 1

## 2016-11-05 MED ORDER — ONDANSETRON HCL 4 MG PO TABS: 4.0000 mg | ORAL_TABLET | Freq: Once | ORAL | Status: DC

## 2016-11-05 MED ORDER — CEFPODOXIME PROXETIL 200 MG PO TABS
200.0000 mg | ORAL_TABLET | Freq: Once | ORAL | Status: AC
  Administered 2016-11-05: 200 mg via ORAL
  Filled 2016-11-05: qty 1

## 2016-11-05 MED ORDER — ADULT MULTIVITAMIN W/MINERALS CH
1.0000 | ORAL_TABLET | Freq: Once | ORAL | Status: AC
  Administered 2016-11-05: 1 via ORAL
  Filled 2016-11-05: qty 1

## 2016-11-05 MED ORDER — VITAMIN B-1 100 MG PO TABS: 100.0000 mg | ORAL_TABLET | Freq: Every day | ORAL | Status: DC

## 2016-11-05 MED ORDER — LORAZEPAM 1 MG PO TABS: 0.0000 mg | ORAL_TABLET | Freq: Two times a day (BID) | ORAL | Status: DC

## 2016-11-05 MED ORDER — NAPROXEN 500 MG PO TABS
500.0000 mg | ORAL_TABLET | Freq: Once | ORAL | Status: AC
  Administered 2016-11-05: 500 mg via ORAL
  Filled 2016-11-05: qty 1

## 2016-11-05 MED ORDER — CHLORDIAZEPOXIDE HCL 25 MG PO CAPS: ORAL_CAPSULE | ORAL | 0 refills | Status: DC

## 2016-11-05 MED ORDER — MULTIVITAMINS PO CAPS: 1.0000 | ORAL_CAPSULE | Freq: Every day | ORAL | 0 refills | Status: DC

## 2016-11-05 NOTE — ED Notes (Signed)
Bed: [id number] Expected date: 11/05/16 Expected time:  Means of arrival:  Comments:

## 2016-11-05 NOTE — BH Assessment (Addendum)
Assessment Note  TTS ordered for this patient. Writer discussed patient's clinical needs with EDP Reinaldo Meeker(Cameron Issacs, MD). Sts that patient has self reported depression and issues with substance abuse (alcohol). No SI, HI, and AVH's reported. Dr. Reinaldo Meekerameron Issacs agreed to remove the TTS consult. Writer agreed to provide patient with list of outpatient referrals.

## 2016-11-05 NOTE — ED Notes (Addendum)
Pt able to eat 2 sandwiches and drink 5 Sprite Zero's prior to discharge.

## 2016-11-05 NOTE — ED Triage Notes (Signed)
Per EMS: Pt from home.  Here requesting etoh detox.  Last drink was yesterday or earlier this morning.  Has been drinking 2 fifths and 1-2 bottles of wine daily for 2 weeks.  No hx of DTs.  Also c/o flank pain that started 2 days ago and burning upon urination.

## 2016-11-05 NOTE — Discharge Instructions (Signed)
-   Do NOT drink while taking Librium

## 2016-11-05 NOTE — ED Notes (Signed)
Pt vehemently denies SI/HI.

## 2016-11-05 NOTE — ED Provider Notes (Signed)
[facility]-EMERGENCY DEPT Provider Note   CSN: [id number] Arrival date & time: 11/05/16  1007     History   Chief Complaint Chief Complaint  Patient presents with  . ETOH detox    HPI Julie Conley is a 41 y.o. female.  HPI   40 year oldemale with past medical history of chronic alcoholism, depression, history of psychiatric admissions, who presents with worsening depression and alcohol intake. Patient states that over the last 2 weeks, she has relapsed on alcohol and has been drinking large volumes of alcohol daily. She is drinking at least several bottles of wine daily. She has a history of similar episodes and has had alcohol withdrawal in the past. Denies any hallucinations. Denies overt suicidal ideation but has had thoughts of not wanting to be here. No fevers or chills. Of note, she does endorse urinary frequency over the last several days as well. No fevers. No vomiting.  Past Medical History:  Diagnosis Date  . Alcohol abuse   . Anxiety   . Bimalleolar fracture of left ankle 04/2015  . Complication of anesthesia    hypotension with breast augmentation  . Depression   . GERD (gastroesophageal reflux disease)   . Headache    "once/week" (03/20/2016)  . Hypothyroidism 2010-2012   "stopped RX in 2012 when I didn't meet standards" (03/20/2016)  . Iron deficiency anemia   . Migraines    "once/month" (03/20/2016)  . Pneumonia "several times"  . Sciatic pain 04/12/2015   left    Patient Active Problem List   Diagnosis Date Noted  . 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 Alcohol withdrawal syndrome without complication (HCC) 04/24/2016  . Sinus tachycardia 04/24/2016  . Normocytic anemia 04/24/2016  . Alcoholic ketoacidosis 03/20/2016  . Chest pain 03/20/2016  . Alcohol consumption binge drinking 03/20/2016  . Depression 03/20/2016  . Gastritis 03/20/2016  . 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 Elevated lipase   . Hypokalemia 01/20/2016  . Hyperglycemia 01/20/2016  .  Hyponatremia 01/20/2016  . Alcohol dependence with withdrawal with complication (HCC) 11/05/2014  . 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 ABDOMINOPLASTY  2015  . APPENDECTOMY  ~ 1986  . AUGMENTATION MAMMAPLASTY  2015  . CESAREAN SECTION  1996; 2000  . CYSTOSCOPY WITH HYDRODISTENSION AND BIOPSY  2010  . DILATION AND CURETTAGE OF UTERUS  2010  . ENDOMETRIAL ABLATION  2010  . FRACTURE SURGERY    . LAPAROSCOPIC CHOLECYSTECTOMY  2006  . LAPAROSCOPIC TUBAL LIGATION  2001   w/banding  . LAPAROTOMY  2011  . NASAL SEPTOPLASTY W/ TURBINOPLASTY  1991; 2008  . ORIF ANKLE FRACTURE Left 04/15/2015   Procedure: OPEN REDUCTION INTERNAL FIXATION (ORIF) LEFT BIMALLEOLAR AND SYNDESMOSIS ANKLE FRACTURE;  Surgeon: Sheral Apley, MD;  Location: [facility];  Service: Orthopedics;  Laterality: Left;  . ROUX-EN-Y GASTRIC BYPASS  2009  . TONSILLECTOMY  ~ 1981  . VAGINAL HYSTERECTOMY  2013   complete    OB History    No data available       Home Medications    Prior to Admission medications   Medication Sig Start Date End Date Taking? Authorizing Provider  alum & mag hydroxide-simeth (MAALOX/MYLANTA) 200-200-20 MG/5ML suspension Take 30 mLs by mouth every 4 (four) hours as needed for indigestion or heartburn. Patient not taking: Reported on 11/05/2016 08/11/16   Calvert Cantor, MD  cephALEXin (KEFLEX) 500 MG capsule Take 1 capsule (500 mg total) by mouth 4 (four)  times daily. 11/05/16 11/15/16  Shaune Pollack, MD  chlordiazePOXIDE (LIBRIUM) 25 MG capsule 50mg  PO TID x 1D, then 25-50mg  PO BID X 1D, then 25-50mg  PO QD X 1D 11/05/16   Shaune Pollack, MD  famotidine (PEPCID) 20 MG tablet Take 1 tablet (20 mg total) by mouth 2 (two) times daily. Patient not taking: Reported on 11/05/2016 08/11/16   Calvert Cantor, MD  ferrous sulfate 325 (65 FE) MG EC tablet Take 1 tablet (325 mg total) by mouth 3 (three) times daily with meals. Patient not taking:  Reported on 07/04/2016 04/26/16   Marinda Elk, MD  Multiple Vitamin (MULTIVITAMIN WITH MINERALS) TABS tablet Take 1 tablet by mouth daily.    Historical Provider, MD  Multiple Vitamin (MULTIVITAMIN) capsule Take 1 capsule by mouth daily. 11/05/16   Shaune Pollack, MD  ondansetron (ZOFRAN ODT) 4 MG disintegrating tablet Take 1 tablet (4 mg total) by mouth every 8 (eight) hours as needed for nausea or vomiting. Patient not taking: Reported on 07/04/2016 06/11/16   Lyndal Pulley, MD  OVER THE COUNTER MEDICATION Take 1 capsule by mouth 2 (two) times daily. "Hair Skin and Nails" supplement    Historical Provider, MD  thiamine (VITAMIN B-1) 100 MG tablet Take 1 tablet (100 mg total) by mouth daily. 11/05/16 12/05/16  Shaune Pollack, MD    Family History Family History  Problem Relation Age of Onset  . Drug abuse Brother   . Alcohol abuse Father   . Drug abuse Mother     Social History Social History  Substance Use Topics  . Smoking status: Current Every Day Smoker    Packs/day: 0.25    Years: 0.50    Types: Cigarettes  . Smokeless tobacco: Never Used  . Alcohol use Yes     Comment: Drinks 2-5ths per day and 2 big bottles of wine on Sundays      Allergies   Adhesive [tape]; Ambien [zolpidem tartrate]; and Sulfa antibiotics   Review of Systems Review of Systems  Constitutional: Positive for fatigue. Negative for chills and fever.  HENT: Negative for congestion and rhinorrhea.   Eyes: Negative for visual disturbance.  Respiratory: Negative for cough, shortness of breath and wheezing.   Cardiovascular: Negative for chest pain and leg swelling.  Gastrointestinal: Negative for abdominal pain, diarrhea, nausea and vomiting.  Genitourinary: Negative for dysuria and flank pain.  Musculoskeletal: Negative for neck pain and neck stiffness.  Skin: Negative for rash and wound.  Allergic/Immunologic: Negative for immunocompromised state.  Neurological: Positive for tremors. Negative for  syncope, weakness and headaches.  All other systems reviewed and are negative.    Physical Exam Updated Vital Signs BP 151/78 (BP Location: Right Arm)   Pulse 102   Temp 98.1 F (36.7 C) (Oral)   Resp 18   SpO2 98%   Physical Exam  Constitutional: She is oriented to person, place, and time. She appears well-developed and well-nourished. No distress.  HENT:  Head: Normocephalic and atraumatic.  Eyes: Conjunctivae are normal.  Neck: Neck supple.  Cardiovascular: Regular rhythm and normal heart sounds.  Tachycardia present.  Exam reveals no friction rub.   No murmur heard. Pulmonary/Chest: Effort normal and breath sounds normal. No respiratory distress. She has no wheezes. She has no rales.  Abdominal: She exhibits no distension.  Musculoskeletal: She exhibits no edema.  Neurological: She is alert and oriented to person, place, and time. She has normal strength. No cranial nerve deficit. She exhibits normal muscle tone. GCS eye subscore is 4.  GCS verbal subscore is 5. GCS motor subscore is 6.  Mildly tremulous.  Skin: Skin is warm. Capillary refill takes less than 2 seconds.  Psychiatric: She has a normal mood and affect.  Nursing note and vitals reviewed.    ED Treatments / Results  Labs (all labs ordered are listed, but only abnormal results are displayed) Labs Reviewed  COMPREHENSIVE METABOLIC PANEL - Abnormal; Notable for the following:       Result Value   Chloride 97 (*)    AST 66 (*)    All other components within normal limits  CBC - Abnormal; Notable for the following:    Platelets 122 (*)    All other components within normal limits  ETHANOL - Abnormal; Notable for the following:    Alcohol, Ethyl (B) 5 (*)    All other components within normal limits  RAPID URINE DRUG SCREEN, HOSP PERFORMED - Abnormal; Notable for the following:    Benzodiazepines POSITIVE (*)    All other components within normal limits  URINALYSIS, ROUTINE W REFLEX MICROSCOPIC - Abnormal;  Notable for the following:    Color, Urine AMBER (*)    APPearance TURBID (*)    Hgb urine dipstick MODERATE (*)    Protein, ur 100 (*)    Leukocytes, UA LARGE (*)    Bacteria, UA MANY (*)    Squamous Epithelial / LPF 6-30 (*)    All other components within normal limits  URINE CULTURE  LIPASE, BLOOD  MAGNESIUM  PREGNANCY, URINE    EKG  EKG Interpretation None       Radiology No results found.  Procedures Procedures (including critical care time)  Medications Ordered in ED Medications  LORazepam (ATIVAN) tablet 0-4 mg (1 mg Oral Given 11/05/16 1344)    Followed by  LORazepam (ATIVAN) tablet 0-4 mg (not administered)  thiamine (VITAMIN B-1) tablet 100 mg (not administered)  ondansetron (ZOFRAN-ODT) disintegrating tablet 4 mg (not administered)  LORazepam (ATIVAN) tablet 2 mg (2 mg Oral Given 11/05/16 1141)  multivitamin with minerals tablet 1 tablet (1 tablet Oral Given 11/05/16 1141)  thiamine (VITAMIN B-1) tablet 100 mg (100 mg Oral Given 11/05/16 1141)  ondansetron (ZOFRAN-ODT) disintegrating tablet 4 mg (4 mg Oral Given 11/05/16 1145)  naproxen (NAPROSYN) tablet 500 mg (500 mg Oral Given 11/05/16 1300)  cefpodoxime (VANTIN) tablet 200 mg (200 mg Oral Given 11/05/16 1300)     Initial Impression / Assessment and Plan / ED Course  I have reviewed the triage vital signs and the nursing notes.  Pertinent labs & imaging results that were available during my care of the patient were reviewed by me and considered in my medical decision making (see chart for details).     41 yo F with PMHx of alcoholism here with desire for detox. On arrival, pt tachycardic but o/w HDS. No hallucinations or signs of DTs. No history of alcohol w/d seizures. Lab work is overall reassuring, with normal electrolytes. Pt provided with outpt resources for inpt care, outpt care. She expresses strong desire for detox and will prescribe Librium taper as outpt. Pt is an [employer] fund[employer] and knows of risks of drinking while  taking librium. Otherwise, she has no SI, HI, AVH, and does not meet IVC criteria. No signs of complicated w/d and vitals improved s/p Ativan. She is AOx4 and mentating at baseline.  Final Clinical Impressions(s) / ED Diagnoses   Final diagnoses:  Alcohol dependence with uncomplicated withdrawal [facility](HCC)    New Prescriptions Discharge Medication List  as of 11/05/2016  3:05 PM    START taking these medications   Details  cephALEXin (KEFLEX) 500 MG capsule Take 1 capsule (500 mg total) by mouth 4 (four) times daily., Starting Thu 11/05/2016, Until Sun 11/15/2016, Print    chlordiazePOXIDE (LIBRIUM) 25 MG capsule 50mg  PO TID x 1D, then 25-50mg  PO BID X 1D, then 25-50mg  PO QD X 1D, Print    Multiple Vitamin (MULTIVITAMIN) capsule Take 1 capsule by mouth daily., Starting Thu 11/05/2016, Print    thiamine (VITAMIN B-1) 100 MG tablet Take 1 tablet (100 mg total) by mouth daily., Starting Thu 11/05/2016, Until Sat 12/05/2016, Print         Shaune Pollack, MD 11/05/16 [phone]

## 2016-11-08 ENCOUNTER — Encounter (HOSPITAL_COMMUNITY): Payer: Self-pay

## 2016-11-08 LAB — URINE CULTURE: Culture: >=100000 — AB

## 2016-11-09 ENCOUNTER — Telehealth: Payer: Self-pay | Admitting: Emergency Medicine

## 2016-11-09 NOTE — Telephone Encounter (Signed)
Post ED Visit - Positive Culture Follow-up  Culture report reviewed by antimicrobial stewardship pharmacist:  []  Enzo BiNathan Batchelder, Pharm.D. []  Celedonio MiyamotoJeremy Frens, Pharm.D., BCPS []  Garvin FilaMike Maccia, Pharm.D. []  Georgina PillionElizabeth Martin, Pharm.D., BCPS []  Breezy PointMinh Pham, [address]Ph[address]D., BCPS, AAHIVP [x]  Estella HuskMichelle Turner, Pharm.D., BCPS, AAHIVP []  Tennis Mustassie Stewart, [address]Ph[address] []  Sherle Poeob Vincent, [address]  Positive urine culture Treated with cephalexin, organism sensitive to the same and no further patient follow-up is required at this time.  Berle MullMiller, Brooks Kinnan 11/09/2016, 3:31 PM

## 2017-06-14 ENCOUNTER — Encounter (HOSPITAL_COMMUNITY): Payer: Self-pay | Admitting: *Deleted

## 2017-06-14 ENCOUNTER — Inpatient Hospital Stay (HOSPITAL_COMMUNITY)
Admission: EM | Admit: 2017-06-14 | Discharge: 2017-06-17 | DRG: 897 | Disposition: A | Discharge status: Home / Self Care | Payer: Self-pay | Attending: Internal Medicine | Admitting: Internal Medicine

## 2017-06-14 DIAGNOSIS — F1020 Alcohol dependence, uncomplicated: Secondary | ICD-10-CM

## 2017-06-14 DIAGNOSIS — E039 Hypothyroidism, unspecified: Secondary | ICD-10-CM | POA: Diagnosis present

## 2017-06-14 DIAGNOSIS — Z720 Tobacco use: Secondary | ICD-10-CM

## 2017-06-14 DIAGNOSIS — F10232 Alcohol dependence with withdrawal with perceptual disturbance: Principal | ICD-10-CM | POA: Diagnosis present

## 2017-06-14 DIAGNOSIS — I472 Ventricular tachycardia: Secondary | ICD-10-CM | POA: Diagnosis present

## 2017-06-14 DIAGNOSIS — Z882 Allergy status to sulfonamides status: Secondary | ICD-10-CM

## 2017-06-14 DIAGNOSIS — F17210 Nicotine dependence, cigarettes, uncomplicated: Secondary | ICD-10-CM | POA: Diagnosis present

## 2017-06-14 DIAGNOSIS — M5430 Sciatica, unspecified side: Secondary | ICD-10-CM | POA: Diagnosis present

## 2017-06-14 DIAGNOSIS — E875 Hyperkalemia: Secondary | ICD-10-CM | POA: Diagnosis present

## 2017-06-14 DIAGNOSIS — D696 Thrombocytopenia, unspecified: Secondary | ICD-10-CM | POA: Diagnosis present

## 2017-06-14 DIAGNOSIS — E871 Hypo-osmolality and hyponatremia: Secondary | ICD-10-CM | POA: Diagnosis present

## 2017-06-14 DIAGNOSIS — K2920 Alcoholic gastritis without bleeding: Secondary | ICD-10-CM | POA: Diagnosis present

## 2017-06-14 DIAGNOSIS — Z91048 Other nonmedicinal substance allergy status: Secondary | ICD-10-CM

## 2017-06-14 DIAGNOSIS — F10239 Alcohol dependence with withdrawal, unspecified: Secondary | ICD-10-CM

## 2017-06-14 DIAGNOSIS — Z9884 Bariatric surgery status: Secondary | ICD-10-CM

## 2017-06-14 DIAGNOSIS — Z90710 Acquired absence of both cervix and uterus: Secondary | ICD-10-CM

## 2017-06-14 DIAGNOSIS — Z888 Allergy status to other drugs, medicaments and biological substances status: Secondary | ICD-10-CM

## 2017-06-14 DIAGNOSIS — R443 Hallucinations, unspecified: Secondary | ICD-10-CM

## 2017-06-14 DIAGNOSIS — F10939 Alcohol use, unspecified with withdrawal, unspecified: Secondary | ICD-10-CM | POA: Diagnosis present

## 2017-06-14 DIAGNOSIS — Z811 Family history of alcohol abuse and dependence: Secondary | ICD-10-CM

## 2017-06-14 DIAGNOSIS — R110 Nausea: Secondary | ICD-10-CM

## 2017-06-14 DIAGNOSIS — E872 Acidosis: Secondary | ICD-10-CM | POA: Diagnosis present

## 2017-06-14 DIAGNOSIS — F419 Anxiety disorder, unspecified: Secondary | ICD-10-CM | POA: Diagnosis not present

## 2017-06-14 DIAGNOSIS — E8729 Other acidosis: Secondary | ICD-10-CM

## 2017-06-14 DIAGNOSIS — R1010 Upper abdominal pain, unspecified: Secondary | ICD-10-CM

## 2017-06-14 DIAGNOSIS — G43909 Migraine, unspecified, not intractable, without status migrainosus: Secondary | ICD-10-CM | POA: Diagnosis present

## 2017-06-14 DIAGNOSIS — K219 Gastro-esophageal reflux disease without esophagitis: Secondary | ICD-10-CM | POA: Diagnosis present

## 2017-06-14 DIAGNOSIS — R000 Tachycardia, unspecified: Secondary | ICD-10-CM | POA: Diagnosis present

## 2017-06-14 DIAGNOSIS — Z9049 Acquired absence of other specified parts of digestive tract: Secondary | ICD-10-CM

## 2017-06-14 DIAGNOSIS — F10932 Alcohol use, unspecified with withdrawal with perceptual disturbance: Secondary | ICD-10-CM

## 2017-06-14 LAB — RAPID URINE DRUG SCREEN, HOSP PERFORMED
Amphetamines: NOT DETECTED
BARBITURATES: NOT DETECTED
BENZODIAZEPINES: NOT DETECTED
COCAINE: NOT DETECTED
Opiates: NOT DETECTED
TETRAHYDROCANNABINOL: NOT DETECTED

## 2017-06-14 LAB — COMPREHENSIVE METABOLIC PANEL
ALK PHOS: 78 U/L (ref 38–126)
ALT: 31 U/L (ref 14–54)
AST: 45 U/L — ABNORMAL HIGH (ref 15–41)
Albumin: 4 g/dL (ref 3.5–5.0)
Anion gap: 16 — ABNORMAL HIGH (ref 5–15)
CALCIUM: 8.5 mg/dL — AB (ref 8.9–10.3)
CO2: 19 mmol/L — AB (ref 22–32)
CREATININE: 0.55 mg/dL (ref 0.44–1.00)
Chloride: 95 mmol/L — ABNORMAL LOW (ref 101–111)
Glucose, Bld: 104 mg/dL — ABNORMAL HIGH (ref 65–99)
Potassium: 3.6 mmol/L (ref 3.5–5.1)
Sodium: 130 mmol/L — ABNORMAL LOW (ref 135–145)
Total Bilirubin: 0.8 mg/dL (ref 0.3–1.2)
Total Protein: 6.9 g/dL (ref 6.5–8.1)

## 2017-06-14 LAB — CBC WITH DIFFERENTIAL/PLATELET
Basophils Absolute: 0 10*3/uL (ref 0.0–0.1)
Basophils Relative: 0 %
Eosinophils Absolute: 0 10*3/uL (ref 0.0–0.7)
Eosinophils Relative: 0 %
HCT: 35.5 % — ABNORMAL LOW (ref 36.0–46.0)
HEMOGLOBIN: 12.8 g/dL (ref 12.0–15.0)
LYMPHS ABS: 1.5 10*3/uL (ref 0.7–4.0)
LYMPHS PCT: 24 %
MCH: 31.7 pg (ref 26.0–34.0)
MCHC: 36.1 g/dL — ABNORMAL HIGH (ref 30.0–36.0)
MCV: 87.9 fL (ref 78.0–100.0)
Monocytes Absolute: 0.5 10*3/uL (ref 0.1–1.0)
Monocytes Relative: 8 %
NEUTROS ABS: 4.2 10*3/uL (ref 1.7–7.7)
NEUTROS PCT: 68 %
Platelets: 125 10*3/uL — ABNORMAL LOW (ref 150–400)
RBC: 4.04 MIL/uL (ref 3.87–5.11)
RDW: 14.9 % (ref 11.5–15.5)
WBC: 6.2 10*3/uL (ref 4.0–10.5)

## 2017-06-14 LAB — URINALYSIS, ROUTINE W REFLEX MICROSCOPIC
BILIRUBIN URINE: NEGATIVE
Glucose, UA: NEGATIVE mg/dL
Hgb urine dipstick: NEGATIVE
KETONES UR: 20 mg/dL — AB
Leukocytes, UA: NEGATIVE
Nitrite: NEGATIVE
Protein, ur: 30 mg/dL — AB
SPECIFIC GRAVITY, URINE: 1.014 (ref 1.005–1.030)
pH: 6 (ref 5.0–8.0)

## 2017-06-14 LAB — LIPASE, BLOOD: LIPASE: 41 U/L (ref 11–51)

## 2017-06-14 LAB — ACETAMINOPHEN LEVEL: Acetaminophen (Tylenol), Serum: <10 ug/mL — ABNORMAL LOW (ref 10–30)

## 2017-06-14 LAB — SALICYLATE LEVEL: Salicylate Lvl: <7 mg/dL (ref 2.8–30.0)

## 2017-06-14 LAB — ETHANOL: Alcohol, Ethyl (B): 20 mg/dL — ABNORMAL HIGH (ref <10)

## 2017-06-14 LAB — I-STAT TROPONIN, ED: TROPONIN I, POC: 0 ng/mL (ref 0.00–0.08)

## 2017-06-14 LAB — I-STAT BETA HCG BLOOD, ED (MC, WL, AP ONLY)

## 2017-06-14 LAB — MAGNESIUM: MAGNESIUM: 2.1 mg/dL (ref 1.7–2.4)

## 2017-06-14 MED ORDER — SODIUM CHLORIDE 0.9 % IV SOLN
INTRAVENOUS | Status: AC
  Administered 2017-06-14 – 2017-06-15 (×2): via INTRAVENOUS
  Administered 2017-06-15: 100 mL/h via INTRAVENOUS

## 2017-06-14 MED ORDER — FOLIC ACID 1 MG PO TABS
1.0000 mg | ORAL_TABLET | Freq: Every day | ORAL | Status: DC
  Administered 2017-06-15 – 2017-06-17 (×3): 1 mg via ORAL
  Filled 2017-06-14 (×3): qty 1

## 2017-06-14 MED ORDER — THIAMINE HCL 100 MG/ML IJ SOLN
Freq: Once | INTRAVENOUS | Status: AC
  Administered 2017-06-14: 16:00:00 via INTRAVENOUS
  Filled 2017-06-14: qty 1000

## 2017-06-14 MED ORDER — ONDANSETRON HCL 4 MG PO TABS: 4.0000 mg | ORAL_TABLET | Freq: Four times a day (QID) | ORAL | Status: DC | PRN

## 2017-06-14 MED ORDER — LORAZEPAM 2 MG/ML IJ SOLN: 0.0000 mg | Freq: Two times a day (BID) | INTRAMUSCULAR | Status: DC

## 2017-06-14 MED ORDER — FAMOTIDINE IN NACL 20-0.9 MG/50ML-% IV SOLN
20.0000 mg | Freq: Once | INTRAVENOUS | Status: AC
Start: 2017-06-14 — End: 2017-06-14
  Administered 2017-06-14: 20 mg via INTRAVENOUS
  Filled 2017-06-14: qty 50

## 2017-06-14 MED ORDER — NICOTINE 14 MG/24HR TD PT24
14.0000 mg | MEDICATED_PATCH | Freq: Every day | TRANSDERMAL | Status: DC
  Administered 2017-06-14 – 2017-06-17 (×4): 14 mg via TRANSDERMAL
  Filled 2017-06-14 (×4): qty 1

## 2017-06-14 MED ORDER — ONDANSETRON HCL 4 MG/2ML IJ SOLN
4.0000 mg | Freq: Four times a day (QID) | INTRAMUSCULAR | Status: DC | PRN
  Administered 2017-06-15 – 2017-06-16 (×3): 4 mg via INTRAVENOUS
  Filled 2017-06-14 (×4): qty 2

## 2017-06-14 MED ORDER — ACETAMINOPHEN 650 MG RE SUPP: 650.0000 mg | Freq: Four times a day (QID) | RECTAL | Status: DC | PRN

## 2017-06-14 MED ORDER — LORAZEPAM 1 MG PO TABS: 0.0000 mg | ORAL_TABLET | Freq: Four times a day (QID) | ORAL | Status: DC

## 2017-06-14 MED ORDER — PANTOPRAZOLE SODIUM 40 MG IV SOLR
40.0000 mg | INTRAVENOUS | Status: DC
  Administered 2017-06-14 – 2017-06-15 (×2): 40 mg via INTRAVENOUS
  Filled 2017-06-14 (×2): qty 40

## 2017-06-14 MED ORDER — VITAMIN B-1 100 MG PO TABS
100.0000 mg | ORAL_TABLET | Freq: Every day | ORAL | Status: DC
  Administered 2017-06-15 – 2017-06-17 (×3): 100 mg via ORAL
  Filled 2017-06-14 (×3): qty 1

## 2017-06-14 MED ORDER — THIAMINE HCL 100 MG/ML IJ SOLN: 100.0000 mg | Freq: Every day | INTRAMUSCULAR | Status: DC

## 2017-06-14 MED ORDER — GI COCKTAIL ~~LOC~~
30.0000 mL | Freq: Three times a day (TID) | ORAL | Status: DC | PRN
  Administered 2017-06-14 – 2017-06-16 (×3): 30 mL via ORAL
  Filled 2017-06-14 (×3): qty 30

## 2017-06-14 MED ORDER — ADULT MULTIVITAMIN W/MINERALS CH
1.0000 | ORAL_TABLET | Freq: Every day | ORAL | Status: DC
  Administered 2017-06-15 – 2017-06-17 (×3): 1 via ORAL
  Filled 2017-06-14 (×3): qty 1

## 2017-06-14 MED ORDER — LORAZEPAM 2 MG/ML IJ SOLN
0.0000 mg | Freq: Four times a day (QID) | INTRAMUSCULAR | Status: DC
  Administered 2017-06-14 (×2): 2 mg via INTRAVENOUS
  Filled 2017-06-14 (×2): qty 1

## 2017-06-14 MED ORDER — VITAMIN B-1 100 MG PO TABS: 100.0000 mg | ORAL_TABLET | Freq: Every day | ORAL | Status: DC

## 2017-06-14 MED ORDER — ACETAMINOPHEN 325 MG PO TABS
650.0000 mg | ORAL_TABLET | Freq: Four times a day (QID) | ORAL | Status: DC | PRN
  Administered 2017-06-15: 650 mg via ORAL
  Filled 2017-06-14: qty 2

## 2017-06-14 MED ORDER — LORAZEPAM 1 MG PO TABS: 0.0000 mg | ORAL_TABLET | Freq: Two times a day (BID) | ORAL | Status: DC

## 2017-06-14 MED ORDER — LORAZEPAM 2 MG/ML IJ SOLN
2.0000 mg | INTRAMUSCULAR | Status: DC | PRN
  Administered 2017-06-14 – 2017-06-15 (×4): 2 mg via INTRAVENOUS
  Filled 2017-06-14 (×4): qty 1

## 2017-06-14 MED ORDER — GI COCKTAIL ~~LOC~~
30.0000 mL | Freq: Once | ORAL | Status: AC
  Administered 2017-06-14: 30 mL via ORAL
  Filled 2017-06-14: qty 30

## 2017-06-14 MED ORDER — PROMETHAZINE HCL 25 MG/ML IJ SOLN
25.0000 mg | Freq: Once | INTRAMUSCULAR | Status: AC
  Administered 2017-06-14: 25 mg via INTRAVENOUS
  Filled 2017-06-14 (×2): qty 1

## 2017-06-14 MED ORDER — SODIUM CHLORIDE 0.9% FLUSH
3.0000 mL | Freq: Two times a day (BID) | INTRAVENOUS | Status: DC
  Administered 2017-06-15 – 2017-06-17 (×4): 3 mL via INTRAVENOUS

## 2017-06-14 NOTE — ED Notes (Signed)
EDPA Provider at bedside. 

## 2017-06-14 NOTE — ED Notes (Signed)
Pt reports chest tightness 12 lead done and shown to EDP

## 2017-06-14 NOTE — Care Management (Signed)
CM consult reviewed. Patient is being admitted. Care Management will follow for discharge needs. Bannon Giammarco RN CCM 

## 2017-06-14 NOTE — ED Provider Notes (Signed)
[facility]-EMERGENCY DEPT Provider Note   CSN: [id number] Arrival date & time: 06/14/17  1437     History   Chief Complaint Chief Complaint  Patient presents with  . Alcohol Problem    HPI Julie Conley is a 41 y.o. female with a PMHx of alcoholism, depression, anxiety, GERD, hypothyroidism, migraines, anemia, sciatica, sinus tachycardia, and other conditions listed below, who presents to the ED with complaints of alcohol withdrawal symptoms. Patient states that she has been binge drinking for the last 2 weeks, yesterday all day she had about 4-5 40 ounce beers and some tequila that she drank throughout the day and into the night. This morning around 10 AM she was having withdrawal symptoms so she had an additional 40 ounce beer in order to resolve her symptoms however this did not help. She presented to the ER for additional help with her alcohol withdrawal. She complains of symptoms including nausea, tactile hallucinations feeling like her skin is crawling, visual hallucinations seeing spots of color in her vision, shakiness/tremors, 4 episodes of nonbloody watery diarrhea today, and epigastric pain. She describes the pain is 8/10 constant burning nonradiating epigastric pain that worsens with "gagging" and with no treatments tried prior to arrival for her pain; states zofran was given en route for her nausea but this didn't help. Chart review reveals she was admitted for EtOH withdrawal was 08/2016; last ED visit was 11/2016 for EtOH detox but she did not require admission at that time. She states she's been trying to stay away from alcohol but has been under a lot of stress lately and has slipped up. +Smoker.   She denies fevers, chills, CP, SOB, vomiting, constipation, obstipation, melena, hematochezia, hematuria, dysuria, vaginal bleeding/discharge, myalgias, arthralgias, numbness, tingling, focal weakness, SI, HI, auditory hallucinations, or any other complaints at this time. She denies being  on any medications at home at this time. Does not currently have a PCP.    The history is provided by the patient and medical records. No language interpreter was used.  Alcohol Problem  This is a chronic problem. The current episode started more than 1 week ago. The problem occurs daily. The problem has not changed since onset.Associated symptoms include abdominal pain. Pertinent negatives include no chest pain and no shortness of breath. The symptoms are aggravated by drinking. Nothing relieves the symptoms. She has tried nothing for the symptoms. The treatment provided no relief.    Past Medical History:  Diagnosis Date  . Alcohol abuse   . Anxiety   . Bimalleolar fracture of left ankle 04/2015  . Complication of anesthesia    hypotension with breast augmentation  . Depression   . GERD (gastroesophageal reflux disease)   . Headache    "once/week" (03/20/2016)  . Hypothyroidism 2010-2012   "stopped RX in 2012 when I didn't meet standards" (03/20/2016)  . Iron deficiency anemia   . Migraines    "once/month" (03/20/2016)  . Pneumonia "several times"  . Sciatic pain 04/12/2015   left    Patient Active Problem List   Diagnosis Date Noted  . 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 Alcohol withdrawal syndrome without complication (HCC) 04/24/2016  . Sinus tachycardia 04/24/2016  . Normocytic anemia 04/24/2016  . Alcoholic ketoacidosis 03/20/2016  . Chest pain 03/20/2016  . Alcohol consumption binge drinking 03/20/2016  . Depression 03/20/2016  . Gastritis 03/20/2016  . 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 Elevated lipase   . Hypokalemia 01/20/2016  . Hyperglycemia 01/20/2016  . Hyponatremia 01/20/2016  .  Alcohol dependence with withdrawal with complication (HCC) 11/05/2014  . 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 ABDOMINOPLASTY  2015  . APPENDECTOMY  ~ 1986  . AUGMENTATION MAMMAPLASTY  2015  . CESAREAN SECTION  1996; 2000   . CYSTOSCOPY WITH HYDRODISTENSION AND BIOPSY  2010  . DILATION AND CURETTAGE OF UTERUS  2010  . ENDOMETRIAL ABLATION  2010  . FRACTURE SURGERY    . LAPAROSCOPIC CHOLECYSTECTOMY  2006  . LAPAROSCOPIC TUBAL LIGATION  2001   w/banding  . LAPAROTOMY  2011  . NASAL SEPTOPLASTY W/ TURBINOPLASTY  1991; 2008  . ORIF ANKLE FRACTURE Left 04/15/2015   Procedure: OPEN REDUCTION INTERNAL FIXATION (ORIF) LEFT BIMALLEOLAR AND SYNDESMOSIS ANKLE FRACTURE;  Surgeon: Sheral Apley, MD;  Location:  [facility];  Service: Orthopedics;  Laterality: Left;  . ROUX-EN-Y GASTRIC BYPASS  2009  . TONSILLECTOMY  ~ 1981  . VAGINAL HYSTERECTOMY  2013   complete    OB History    No data available       Home Medications    Prior to Admission medications   Medication Sig Start Date End Date Taking? Authorizing Provider  alum & mag hydroxide-simeth (MAALOX/MYLANTA) 200-200-20 MG/5ML suspension Take 30 mLs by mouth every 4 (four) hours as needed for indigestion or heartburn. Patient not taking: Reported on 11/05/2016 08/11/16   Calvert Cantor, MD  chlordiazePOXIDE (LIBRIUM) 25 MG capsule  PO TID x 1D, then 25-50mg  PO BID X 1D, then 25-50mg  PO QD X 1D Patient not taking: Reported on 06/14/2017 11/05/16   Shaune Pollack, MD  famotidine (PEPCID) 20 MG tablet Take 1 tablet (20 mg total) by mouth 2 (two) times daily. Patient not taking: Reported on 11/05/2016 08/11/16   Calvert Cantor, MD  ferrous sulfate 325 (65 FE) MG EC tablet Take 1 tablet (325 mg total) by mouth 3 (three) times daily with meals. Patient not taking: Reported on 07/04/2016 04/26/16   Marinda Elk, MD  Multiple Vitamin (MULTIVITAMIN) capsule Take 1 capsule by mouth daily. Patient not taking: Reported on 06/14/2017 11/05/16   Shaune Pollack, MD  ondansetron (ZOFRAN ODT) 4 MG disintegrating tablet Take 1 tablet (4 mg total) by mouth every 8 (eight) hours as needed for nausea or vomiting. Patient not taking: Reported on 07/04/2016 06/11/16    Lyndal Pulley, MD    Family History Family History  Problem Relation Age of Onset  . Drug abuse Brother   . Alcohol abuse Father   . Drug abuse Mother     Social History Social History  Substance Use Topics  . Smoking status: Current Every Day Smoker    Packs/day: 0.25    Years: 0.50    Types: Cigarettes  . Smokeless tobacco: Never Used  . Alcohol use Yes     Comment: Drinks 2-5ths per day and 2 big bottles of wine on Sundays      Allergies   Adhesive [tape]; Ambien [zolpidem tartrate]; and Sulfa antibiotics   Review of Systems Review of Systems  Constitutional: Negative for chills and fever.  Respiratory: Negative for shortness of breath.   Cardiovascular: Negative for chest pain.  Gastrointestinal: Positive for abdominal pain, diarrhea and nausea. Negative for blood in stool, constipation and vomiting.  Genitourinary: Negative for dysuria, hematuria, vaginal bleeding and vaginal discharge.  Musculoskeletal: Negative for arthralgias and myalgias.  Skin: Negative for color change.  Allergic/Immunologic: Negative for immunocompromised state.  Neurological: Positive for tremors. Negative for weakness and  numbness.  Psychiatric/Behavioral: Positive for hallucinations. Negative for confusion and suicidal ideas.   All other systems reviewed and are negative for acute change except as noted in the HPI.    Physical Exam Updated Vital Signs BP (!) 123/96 (BP Location: Left Arm)   Pulse (!) 117   Temp 98.9 F (37.2 C) (Oral)   Resp 18   SpO2 100%   Physical Exam  Constitutional: She is oriented to person, place, and time. Vital signs are normal. She appears well-developed and well-nourished.  Non-toxic appearance. She appears distressed (anxious appearing).  Afebrile, nontoxic, anxious appearing  HENT:  Head: Normocephalic and atraumatic.  Mouth/Throat: Oropharynx is clear and moist. Mucous membranes are dry.  Dry lips  Eyes: Conjunctivae and EOM are normal. Right  eye exhibits no discharge. Left eye exhibits no discharge.  Neck: Normal range of motion. Neck supple.  Cardiovascular: Regular rhythm, normal heart sounds and intact distal pulses.  Tachycardia present.  Exam reveals no gallop and no friction rub.   No murmur heard. Tachycardic in the 110s, fairly similar to prior visits  Pulmonary/Chest: Effort normal and breath sounds normal. No respiratory distress. She has no decreased breath sounds. She has no wheezes. She has no rhonchi. She has no rales.  Abdominal: Soft. Normal appearance and bowel sounds are normal. She exhibits no distension. There is tenderness in the epigastric area and left upper quadrant. There is no rigidity, no rebound, no guarding, no CVA tenderness, no tenderness at McBurney's point and negative Murphy's sign.  Soft, nondistended, +BS throughout, with mild epigastric and LUQ TTP, no r/g/r, neg murphy's, neg mcburney's, no CVA TTP   Musculoskeletal: Normal range of motion.  Neurological: She is alert and oriented to person, place, and time. She has normal strength. She displays tremor. No sensory deficit.  Slightly tremulous in both hands  Skin: Skin is warm, dry and intact. No rash noted.  Psychiatric: Her mood appears anxious. She is actively hallucinating. She expresses no homicidal and no suicidal ideation. She expresses no suicidal plans and no homicidal plans.  Endorsing tactile and visual hallucinations, denies auditory hallucinations, doesn't seem to be responding to internal stimuli. Denies SI or HI. Appears anxious but pleasant and cooperative  Nursing note and vitals reviewed.  15:43:24 CIWA Alcohol Scale AL  CIWA-Ar  Nausea and Vomiting: 2  Tactile Disturbances: moderate itching, pins and needles, burning or numbness  Tremor: moderate, with patient's arms extended  Auditory Disturbances: not present  Paroxysmal Sweats: barely perceptible sweating, palms moist  Visual Disturbances: mild sensitivity  Anxiety: three   Headache, Fullness in Head: moderate  Agitation: somewhat more than normal activity  Orientation and Clouding of Sensorium: oriented and can do serial additions  CIWA-Ar Total: 19     ED Treatments / Results  Labs (all labs ordered are listed, but only abnormal results are displayed) Labs Reviewed  CBC WITH DIFFERENTIAL/PLATELET - Abnormal; Notable for the following:       Result Value   HCT 35.5 (*)    MCHC 36.1 (*)    Platelets 125 (*)    All other components within normal limits  COMPREHENSIVE METABOLIC PANEL - Abnormal; Notable for the following:    Sodium 130 (*)    Chloride 95 (*)    CO2 19 (*)    Glucose, Bld 104 (*)    BUN <5 (*)    Calcium 8.5 (*)    AST 45 (*)    Anion gap 16 (*)    All other  components within normal limits  ETHANOL - Abnormal; Notable for the following:    Alcohol, Ethyl (B) 20 (*)    All other components within normal limits  ACETAMINOPHEN LEVEL - Abnormal; Notable for the following:    Acetaminophen (Tylenol), Serum <10 (*)    All other components within normal limits  LIPASE, BLOOD  SALICYLATE LEVEL  URINALYSIS, ROUTINE W REFLEX MICROSCOPIC  RAPID URINE DRUG SCREEN, HOSP PERFORMED  I-STAT BETA HCG BLOOD, ED ([facility], [facility], [facility] ONLY)  I-STAT TROPONIN, ED    EKG  EKG Interpretation  Date/Time:  Monday June 14 2017 15:47:01 EDT Ventricular Rate:  115 PR Interval:    QRS Duration: 69 QT Interval:  317 QTC Calculation: 439 R Axis:   -58 Text Interpretation:  Sinus tachycardia Paired ventricular premature complexes Left anterior fascicular block Abnormal R-wave progression, late transition Confirmed by Rolland Porter ([id number]) on 06/14/2017 3:49:45 PM Also confirmed by Rolland Porter ([id number]), editor Misty Stanley [phone])  on 06/14/2017 3:53:44 PM       Radiology No results found.  Procedures Procedures (including critical care time)  Medications Ordered in ED Medications  LORazepam (ATIVAN) injection 0-4 mg (2 mg Intravenous Given  06/14/17 1601)    Or  LORazepam (ATIVAN) tablet 0-4 mg ( Oral See Alternative 06/14/17 1601)  LORazepam (ATIVAN) injection 0-4 mg (not administered)    Or  LORazepam (ATIVAN) tablet 0-4 mg (not administered)  thiamine (VITAMIN B-1) tablet 100 mg (not administered)    Or  thiamine (B-1) injection 100 mg (not administered)  sodium chloride 0.9 % 1,000 mL with thiamine 100 mg, folic acid 1 mg, multivitamins adult 10 mL infusion ( Intravenous New Bag/Given 06/14/17 1601)  promethazine (PHENERGAN) injection 25 mg (25 mg Intravenous Given 06/14/17 1716)  famotidine (PEPCID) IVPB 20 mg premix (0 mg Intravenous Stopped 06/14/17 1631)  gi cocktail (Maalox,Lidocaine,Donnatal) (30 mLs Oral Given 06/14/17 1607)     Initial Impression / Assessment and Plan / ED Course  I have reviewed the triage vital signs and the nursing notes.  Pertinent labs & imaging results that were available during my care of the patient were reviewed by me and considered in my medical decision making (see chart for details).     41 y.o. female here with alcohol withdrawal symptoms since 10am, has been binge drinking for 2wks, tried to stop late last night but this morning was having tremors, diarrhea, tactile/visual hallucinations, nausea, and epigastric abd pain which feels the same as her prior detox/withdrawal symptoms. Tried to drink a 40 oz beer to stop the symptoms but they persisted so she called EMS. On exam, slightly tremulous, anxious appearing, tachycardic in the 100s which is similar to prior visits, mild epigastric and LUQ TTP but nonperitoneal and neg murphy's. Will get labs including EtOH level, tox screens, trop, and betaHCG, and get EKG but doubt need for CXR (pt denies chest pain to me). Will give banana bag of fluids, phenergan, pepcid, and GI cocktail, and obtain CIWA score to determine if she needs admission for this and to determine dose of ativan needed. Will reassess shortly.   3:55 PM CIWA score 19, will  proceed with ativan dosing per CIWA protocol orders; EKG without acute ischemic findings, fairly similar to prior EKGs. Awaiting further work up however she will likely need admission. Will reassess shortly once work up is completed.   5:32 PM CBC w/diff with chronic stable thrombocytopenia. CMP with mildly low Na 130, Cl 95, elevated anion gap 16 and slightly low  bicarb 19 likely from EtOH use; AST 45 but otherwise LFTs are WNL. Lipase negative. BetaHCG neg. Trop neg. EtOH level 20. Salicylate and acetaminophen levels WNL. U/A and UDS not yet done, pt doesn't feel the need to urinate yet; fluids going in very slowly due to IV placement, advised to keep arm straight. Pt feeling a little better after PO cocktail, other meds just now given so hard to say if she's had good improvement with that; ativan helped slightly. Will proceed with admission for alcohol withdrawal.   5:39 PM Dr. Osvaldo Shipper of [facility] returning page and will admit. Holding orders to be placed by admitting team. Please see their notes for further documentation of care. I appreciate their help with this pleasant pt's care. Pt stable at time of admission.     Final Clinical Impressions(s) / ED Diagnoses   Final diagnoses:  Alcohol withdrawal syndrome with perceptual disturbance (HCC)  Hallucinations  Tachycardia  Nausea  Upper abdominal pain  Chronic alcoholic gastritis, presence of bleeding unspecified  Alcoholism (HCC)  Tobacco user  Hyponatremia  Increased anion gap metabolic acidosis  Thrombocytopenia [facility])    New Prescriptions New Prescriptions   No medications on [id number], [place], [place] 06/14/17 1740    Rolland Porter, MD 06/15/17 [phone]

## 2017-06-14 NOTE — ED Notes (Signed)
ADMISSION Provider at bedside. 

## 2017-06-14 NOTE — ED Triage Notes (Signed)
Per EMS, pt w/ hx of alcohol abuse c/o withdrawal symptoms since yesterday, worse since this morning. Pt last drank heavily 2 days ago. Pt had a beer today to help with symptoms. Pt complains of nausea, chest pain. Pt was hypertensive and tachycardic for EMS. Pt given  ODT zofran. Pt given 500cc NS.   CBG 120

## 2017-06-14 NOTE — ED Notes (Signed)
AWARE OF NEED FOR URINE 

## 2017-06-14 NOTE — ED Notes (Signed)
Bed: [facility]21 Expected date:  Expected time:  Means of arrival:  Comments: Alcohol withdrawal

## 2017-06-14 NOTE — H&P (Signed)
[facility] History and Physical  Julie Conley ZOX:[id number] DOB: 23-Jul-1976 DOA: 06/14/2017   PCP: She does not have a PCP Specialists: None  Chief Complaint: "I'm going through withdrawal."  HPI: Julie Conley is a 41 y.o. female with a past medical history of alcoholism, history of acid reflux who has had issues with alcohol abuse for a long period of time. She was last hospitalized in December 2017 due to alcohol withdrawal syndrome. She started drinking again heavily about 2 weeks ago. She was having a fifth of tequila every day. And then on Saturday, she decided to wean herself off and started drinking beer instead of tequila. She started feeling tremulous yesterday. Started having nausea with a few episodes of vomiting. Started having loose stools without any blood. She also noticed abdominal pain. Her symptoms continued to get worse. She also started seeing snowflakes in the room and felt as if she was hallucinating. So she decided to seek attention.  In the emergency department patient was given IV fluids, benzodiazepine. She is feeling slightly better but continues to have tremors. She continues to have palpitations. Denies any chest pain, shortness of breath. With minimal exertion heart rate goes up into the 140s. Patient will need hospitalization for further management.  Home Medications: Prior to Admission medications   Medication Sig Start Date End Date Taking? Authorizing Provider  famotidine (PEPCID) 20 MG tablet Take 1 tablet (20 mg total) by mouth 2 (two) times daily. Patient not taking: Reported on 11/05/2016 08/11/16   Calvert Cantor, MD  ferrous sulfate 325 (65 FE) MG EC tablet Take 1 tablet (325 mg total) by mouth 3 (three) times daily with meals. Patient not taking: Reported on 07/04/2016 04/26/16   Marinda Elk, MD  Multiple Vitamin (MULTIVITAMIN) capsule Take 1 capsule by mouth daily. Patient not taking: Reported on 06/14/2017 11/05/16   Shaune Pollack,  MD    Allergies:  Allergies  Allergen Reactions  . Adhesive [Tape] Other (See Comments)    TEARS SKIN  . Ambien [Zolpidem Tartrate] Other (See Comments)    Delirium, disorientation  . Sulfa Antibiotics Itching    Past Medical History: Past Medical History:  Diagnosis Date  . Alcohol abuse   . Anxiety   . Bimalleolar fracture of left ankle 04/2015  . Complication of anesthesia    hypotension with breast augmentation  . Depression   . GERD (gastroesophageal reflux disease)   . Headache    "once/week" (03/20/2016)  . Hypothyroidism 2010-2012   "stopped RX in 2012 when I didn't meet standards" (03/20/2016)  . Iron deficiency anemia   . Migraines    "once/month" (03/20/2016)  . Pneumonia "several times"  . Sciatic pain 04/12/2015   left    Past Surgical History:  Procedure Laterality Date  . ABDOMINOPLASTY  2015  . APPENDECTOMY  ~ 1986  . AUGMENTATION MAMMAPLASTY  2015  . CESAREAN SECTION  1996; 2000  . CYSTOSCOPY WITH HYDRODISTENSION AND BIOPSY  2010  . DILATION AND CURETTAGE OF UTERUS  2010  . ENDOMETRIAL ABLATION  2010  . FRACTURE SURGERY    . LAPAROSCOPIC CHOLECYSTECTOMY  2006  . LAPAROSCOPIC TUBAL LIGATION  2001   w/banding  . LAPAROTOMY  2011  . NASAL SEPTOPLASTY W/ TURBINOPLASTY  1991; 2008  . ORIF ANKLE FRACTURE Left 04/15/2015   Procedure: OPEN REDUCTION INTERNAL FIXATION (ORIF) LEFT BIMALLEOLAR AND SYNDESMOSIS ANKLE FRACTURE;  Surgeon: Sheral Apley, MD;  Location: [facility];  Service: Orthopedics;  Laterality: Left;  . ROUX-EN-Y  GASTRIC BYPASS  2009  . TONSILLECTOMY  ~ 1981  . VAGINAL HYSTERECTOMY  2013   complete    Social History:  Social History   Social History  . Marital status: Widowed    Spouse name: N/A  . Number of children: 2  . Years of education: 4   Occupational History  . 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 Smoking status: Current Every Day Smoker     Packs/day: 0.25    Years: 0.50    Types: Cigarettes  . Smokeless tobacco: Never Used  . Alcohol use Yes     Comment: Drinks 2-5ths per day and 2 big bottles of wine on Sundays   . Drug use: No  . Sexual activity: No   Other Topics Concern  . Not on file   Social History Narrative   ** Merged History Encounter **         Family History:  Family History  Problem Relation Age of Onset  . Drug abuse Brother   . Alcohol abuse Father   . Drug abuse Mother      Review of Systems - History obtained from the patient General ROS: positive for  - fatigue Psychological ROS: positive for - hallucinations Ophthalmic ROS: negative ENT ROS: negative Allergy and Immunology ROS: negative Hematological and Lymphatic ROS: negative Endocrine ROS: negative Respiratory ROS: no cough, shortness of breath, or wheezing Cardiovascular ROS: positive for - palpitations Gastrointestinal ROS: as in hpi Genito-Urinary ROS: no dysuria, trouble voiding, or hematuria Musculoskeletal ROS: negative Neurological ROS: no TIA or stroke symptoms Dermatological ROS: negative  Physical Examination  Vitals:   06/14/17 1606 06/14/17 1700 06/14/17 1712 06/14/17 1714  BP: 126/89 (!) 127/93    Pulse: (!) 108 (!) 130 (!) 123   Resp: Temp:      TempSrc:      SpO2: 99% 97% 98%   Weight:    90.7 kg (200 lb)  Height:     (1.626 m)    BP (!) 127/93   Pulse (!) 123   Temp 98.9 F (37.2 C) (Oral)   Resp 12   Ht  (1.626 m)   Wt 90.7 kg (200 lb)   SpO2 98%   BMI 34.33 kg/m   General appearance: alert, cooperative, appears stated age and no distress Head: Normocephalic, without obvious abnormality, atraumatic Eyes: conjunctivae/corneas clear. PERRL, EOM's intact.  Throat: Dry mucous membranes without any other oral lesions. Neck: no adenopathy, no carotid bruit, no JVD, supple, symmetrical, trachea midline and thyroid not enlarged, symmetric, no tenderness/mass/nodules Back:  symmetric, no curvature. ROM normal. No CVA tenderness. Resp: clear to auscultation bilaterally Cardio: S1, S2 is tachycardic. Regular. No S3, S4. No rubs, murmurs or bruits. No pedal edema GI: Abdomen is soft. Tender diffusely without any rebound, rigidity or guarding. No masses or organomegaly. Bowel sounds are present. Extremities: extremities normal, atraumatic, no cyanosis or edema Pulses: 2+ and symmetric Skin: Skin color, texture, turgor normal. No rashes or lesions Lymph nodes: Cervical, supraclavicular, and axillary nodes normal. Neurologic: Awake and alert. Tremulous. No obvious focal neurological deficits.   Labs on Admission: I have personally reviewed following labs and imaging studies  CBC:  Recent Labs Lab 06/14/17 1544  WBC 6.2  NEUTROABS 4.2  HGB 12.8  HCT 35.5*  MCV 87.9  PLT 125*   Basic Metabolic Panel:  Recent  Labs Lab 06/14/17 1544  NA 130*  K 3.6  CL 95*  CO2 19*  GLUCOSE 104*  BUN <5*  CREATININE 0.55  CALCIUM 8.5*   GFR: Estimated Creatinine Clearance: 101 mL/min (by C-G formula based on SCr of 0.55 mg/dL). Liver Function Tests:  Recent Labs Lab 06/14/17 1544  AST 45*  ALT 31  ALKPHOS 78  BILITOT 0.8  PROT 6.9  ALBUMIN 4.0    Recent Labs Lab 06/14/17 1544  LIPASE 41    My interpretation of Electrocardiogram: Sinus tachycardia. 120 bpm. Left axis deviation. Intervals are normal. No concerning ST segment or T-wave changes.   Problem List  Principal Problem:   Alcohol dependence with withdrawal with complication [facility]) Active Problems:   Hyponatremia   Alcoholic ketoacidosis   Sinus tachycardia   Thrombocytopenia (HCC)   Alcohol withdrawal (HCC)   Assessment: This is a 41 year old female with a past medical history as stated earlier, who presents with tremors, nausea, vomiting, diarrhea. She has visual hallucinations. She is going through alcohol withdrawal. She is noted to be tachycardic and heart rate increases  significantly with minimal exertion. She will need to be hospitalized for further management. She denies any seizure activity.  Plan: #1 Severe alcohol withdrawal syndrome with hallucinations: At least for the initial 24 hours patient needs close monitoring. She will be admitted to the stepdown unit. CIWA protocol will be initiated. She'll be given thiamine, multivitamins, folic acid. Magnesium level will be checked.  #2 Hyponatremia/metabolic acidosis/mild thrombocytopenia: This is all secondary to alcohol. She might be hypovolemic due to GI symptoms. She will be given IV fluids. Repeat labs tomorrow morning.  #3 Nausea, vomiting and diarrhea: Most likely due to alcohol withdrawal. WBC normal. She is afebrile. Did not suspect any infectious process. Lipase is normal. She'll be given PPI due to possibility of alcohol-induced gastritis.  DVT Prophylaxis: SCDs Code Status: Full code Family Communication: Discussed with patient  Consults called: None   Severity of Illness: The appropriate patient status for this patient is INPATIENT. Inpatient status is judged to be reasonable and necessary in order to provide the required intensity of service to ensure the patient's safety. The patient's presenting symptoms, physical exam findings, and initial radiographic and laboratory data in the context of their chronic comorbidities is felt to place them at high risk for further clinical deterioration. Furthermore, it is not anticipated that the patient will be medically stable for discharge from the hospital within 2 midnights of admission. The following factors support the patient status of inpatient.   " The patient's presenting symptoms include tremors, nausea, vomiting and diarrhea. " The worrisome physical exam findings include tachycardia. " The initial radiographic and laboratory data are worrisome because of hyponatremia. " The chronic co-morbidities include alcoholism.   * I certify that at the  point of admission it is my clinical judgment that the patient will require inpatient hospital care spanning beyond 2 midnights from the point of admission due to high intensity of service, high risk for further deterioration and high frequency of surveillance required.*    Further management decisions will depend on results of further testing and patient's response to treatment.   [facility]  [facility] Pager [phone]  If 7PM-7AM, please contact night-coverage [email or web address] Password [email or web address]  06/14/2017, 6:01 PM

## 2017-06-15 DIAGNOSIS — E876 Hypokalemia: Secondary | ICD-10-CM

## 2017-06-15 LAB — CBC
HEMATOCRIT: 31.9 % — AB (ref 36.0–46.0)
HEMOGLOBIN: 11.3 g/dL — AB (ref 12.0–15.0)
MCH: 31.7 pg (ref 26.0–34.0)
MCHC: 35.4 g/dL (ref 30.0–36.0)
MCV: 89.6 fL (ref 78.0–100.0)
Platelets: 95 10*3/uL — ABNORMAL LOW (ref 150–400)
RBC: 3.56 MIL/uL — ABNORMAL LOW (ref 3.87–5.11)
RDW: 15.1 % (ref 11.5–15.5)
WBC: 4.4 10*3/uL (ref 4.0–10.5)

## 2017-06-15 LAB — COMPREHENSIVE METABOLIC PANEL
ALBUMIN: 3.5 g/dL (ref 3.5–5.0)
ALT: 26 U/L (ref 14–54)
ANION GAP: 10 (ref 5–15)
AST: 44 U/L — AB (ref 15–41)
Alkaline Phosphatase: 67 U/L (ref 38–126)
BILIRUBIN TOTAL: 0.8 mg/dL (ref 0.3–1.2)
BUN: 6 mg/dL (ref 6–20)
CHLORIDE: 100 mmol/L — AB (ref 101–111)
CO2: 24 mmol/L (ref 22–32)
Calcium: 8.6 mg/dL — ABNORMAL LOW (ref 8.9–10.3)
Creatinine, Ser: 0.72 mg/dL (ref 0.44–1.00)
GFR calc Af Amer: >60 mL/min (ref >60)
GLUCOSE: 125 mg/dL — AB (ref 65–99)
POTASSIUM: 3.3 mmol/L — AB (ref 3.5–5.1)
Sodium: 134 mmol/L — ABNORMAL LOW (ref 135–145)
TOTAL PROTEIN: 6.2 g/dL — AB (ref 6.5–8.1)

## 2017-06-15 LAB — PROTIME-INR
INR: 0.93
Prothrombin Time: 12.4 seconds (ref 11.4–15.2)

## 2017-06-15 LAB — MAGNESIUM: MAGNESIUM: 2 mg/dL (ref 1.7–2.4)

## 2017-06-15 LAB — HIV ANTIBODY (ROUTINE TESTING W REFLEX): HIV SCREEN 4TH GENERATION: NONREACTIVE

## 2017-06-15 MED ORDER — LORAZEPAM 2 MG/ML IJ SOLN: 0.0000 mg | Freq: Two times a day (BID) | INTRAMUSCULAR | Status: DC

## 2017-06-15 MED ORDER — LORAZEPAM 2 MG/ML IJ SOLN
1.0000 mg | Freq: Four times a day (QID) | INTRAMUSCULAR | Status: DC | PRN
  Administered 2017-06-16 – 2017-06-17 (×3): 1 mg via INTRAVENOUS
  Filled 2017-06-15 (×3): qty 1

## 2017-06-15 MED ORDER — POTASSIUM CHLORIDE CRYS ER 20 MEQ PO TBCR
40.0000 meq | EXTENDED_RELEASE_TABLET | Freq: Once | ORAL | Status: AC
  Administered 2017-06-15: 40 meq via ORAL
  Filled 2017-06-15: qty 2

## 2017-06-15 MED ORDER — LORAZEPAM 1 MG PO TABS: 1.0000 mg | ORAL_TABLET | Freq: Four times a day (QID) | ORAL | Status: DC | PRN

## 2017-06-15 MED ORDER — LORAZEPAM 2 MG/ML IJ SOLN
0.0000 mg | Freq: Four times a day (QID) | INTRAMUSCULAR | Status: AC
  Administered 2017-06-15 – 2017-06-17 (×6): 2 mg via INTRAVENOUS
  Filled 2017-06-15 (×6): qty 1

## 2017-06-15 NOTE — Care Management Note (Signed)
Case Management Note  Patient Details  Name: Bobbijo Holst MRN: [id number] Date of Birth: [birth year]  Subjective/Objective:                  etoh withdrawal   Action/Plan: Date:  June 15, 2017 Chart reviewed for concurrent status and case management needs.  Will continue to follow patient progress.  Discharge Planning: following for needs  Expected discharge date: [id number]  Marcelle Smiling, BSN, [place], [place]   [phone]   Expected Discharge Date:   (unknown)               Expected Discharge Plan:  Home/Self Care  In-House Referral:     Discharge planning Services  CM Consult  Post Acute Care Choice:    Choice offered to:     DME Arranged:    DME Agency:     HH Arranged:    HH Agency:     Status of Service:  In process, will continue to follow  If discussed at Long Length of Stay Meetings, dates discussed:    Additional Comments:  Golda Acre, RN 06/15/2017, 8:30 AM

## 2017-06-15 NOTE — Progress Notes (Signed)
Received pt from ICU; I agree with previous RNs assessment of the pt. Pt denies pain at this time with no s/s of distress noted. Call bell and hydration are within reach; bed is locked and in lowest position with bed alarm on. Will continue to monitor

## 2017-06-15 NOTE — Progress Notes (Signed)
[facility] PROGRESS NOTE  Julie Conley ZOX:[id number] DOB: [birth year] DOA: 06/14/2017  PCP: Patient, No Pcp Per  Brief History/Interval Summary: 41 year old female with a past medical history of alcoholism, history of acid reflux, presented with increased tremors, nausea, vomiting, diarrhea, hallucinations. She was noted to be in severe alcohol withdrawal. She was hospitalized for further management.  Reason for Visit: Alcohol withdrawal syndrome  Consultants: None  Procedures: None   Antibiotics: None  Subjective/Interval History: Patient states that she is feeling better this morning. Continues to see Snowflake's in the room. But overall she states that she is improving. Her nausea, vomiting has subsided. Abdominal pain is improved. She still feels quite tremulous.  ROS: Denies any headaches. No shortness of breath or chest pain.  Objective:  Vital Signs  Vitals:   06/15/17 0500 06/15/17 0800 06/15/17 0801 06/15/17 0954  BP: (!) 145/86 (!) 81/64 122/87 122/87  Pulse:    (!) 107  Resp: 17  17   Temp:   98.4 F (36.9 C)   TempSrc:   Oral   SpO2: 95%  94%   Weight:      Height:        Intake/Output Summary (Last 24 hours) at 06/15/17 1007 Last data filed at 06/15/17 0800  Gross per 24 hour  Intake              450 ml  Output              950 ml  Net             -500 ml   Filed Weights   06/14/17 1714  Weight: 90.7 kg (200 lb)    General appearance: alert, cooperative, appears stated age and no distress Resp: clear to auscultation bilaterally Cardio: S1, S2 is normal, regular at rest. Increases to 120s. Seems to be better compared to yesterday. No murmurs. GI: Abdomen is soft. Mildly tender in the epigastric area without any rebound, rigidity or guarding. No masses, organomegaly. Bowel sounds are present and normal. Extremities: extremities normal, atraumatic, no cyanosis or edema Pulses: 2+ and symmetric Neurologic: Awake and alert. Remains  tremulous. No facial asymmetry. Motor strength equal bilateral upper and lower extremity.  Lab Results:  Data Reviewed: I have personally reviewed following labs and imaging studies  CBC:  Recent Labs Lab 06/14/17 1544 06/15/17 0324  WBC 6.2 4.4  NEUTROABS 4.2  --   HGB 12.8 11.3*  HCT 35.5* 31.9*  MCV 87.9 89.6  PLT 125* 95*    Basic Metabolic Panel:  Recent Labs Lab 06/14/17 1544 06/15/17 0324  NA 130* 134*  K 3.6 3.3*  CL 95* 100*  CO2 19* 24  GLUCOSE 104* 125*  BUN <5* 6  CREATININE 0.55 0.72  CALCIUM 8.5* 8.6*  MG 2.1 2.0    GFR: Estimated Creatinine Clearance: 101 mL/min (by C-G formula based on SCr of 0.72 mg/dL).  Liver Function Tests:  Recent Labs Lab 06/14/17 1544 06/15/17 0324  AST 45* 44*  ALT 31 26  ALKPHOS 78 67  BILITOT 0.8 0.8  PROT 6.9 6.2*  ALBUMIN 4.0 3.5     Recent Labs Lab 06/14/17 1544  LIPASE 41    Coagulation Profile:  Recent Labs Lab 06/15/17 0324  INR 0.93      Radiology Studies: No results found.   Medications:  Scheduled: . folic acid  1 mg Oral Daily  . multivitamin with minerals  1 tablet Oral Daily  . nicotine  14 mg Transdermal  Daily  . pantoprazole (PROTONIX) IV  40 mg Intravenous Q24H  . sodium chloride flush  3 mL Intravenous Q12H  . thiamine  100 mg Oral Daily   Continuous: . sodium chloride 100 mL/hr at 06/14/17 2056   QIO:[id number] **OR** acetaminophen, gi cocktail, LORazepam, ondansetron **OR** ondansetron (ZOFRAN) IV  Assessment/Plan:  Principal Problem:   Alcohol dependence with withdrawal with complication [facility]) Active Problems:   Hyponatremia   Alcoholic ketoacidosis   Sinus tachycardia   Thrombocytopenia (HCC)   Alcohol withdrawal (HCC)    Alcohol withdrawal syndrome with hallucinations. Patient seems to be stable. Resting heart rate has improved. Continue CIWA protocol. Continue thiamine, multivitamins, folic acid. Patient will need counseling  eventually.  Hyponatremia/hyperkalemia/metabolic acidosis. Sodium level has improved. Potassium will be repleted. Magnesium is 2.0. Bicarbonate has corrected. Continue IV fluids for another 24 hours.  Nausea, vomiting and diarrhea. GI symptoms are improving. This could have been due to alcohol withdrawal. She is afebrile. Her lipase is normal. Continue PPI. Symptomatic treatment for now. Patient has tolerated a liquid diet without any difficulties. Advance to soft for now.  Thrombocytopenia. This is most likely due to bone marrow suppression from alcohol. Continue to monitor. There is no evidence for bleeding currently.  DVT Prophylaxis: SCDs    Code Status: Full code  Family Communication: Discussed with the patient  Disposition Plan: Management as outlined above. Possible transfer to the floor later today.    LOS: 1 day   [facility]  [facility] Pager [phone] 06/15/2017, 10:07 AM  If 7PM-7AM, please contact night-coverage at [email or web address], password [facility]

## 2017-06-15 NOTE — Progress Notes (Signed)
Transferred to 1514 via wheelchair. Report given to RN.

## 2017-06-15 NOTE — Clinical Social Work Note (Signed)
Clinical Social Work Assessment  Patient Details  Name: Julie Conley MRN: [id number] Date of Birth: [birth year]  Date of referral:  06/15/17               Reason for consult:  Substance Use/ETOH Abuse                Permission sought to share information with:    Permission granted to share information::     Name::        Agency::     Relationship::     Contact Information:     Housing/Transportation Living arrangements for the past 2 months:  Single Family Home Source of Information:  Patient Patient Interpreter Needed:  None Criminal Activity/Legal Involvement Pertinent to Current Situation/Hospitalization:  No - Comment as needed Significant Relationships:  Adult Children Lives with:  Adult Children Do you feel safe going back to the place where you live?  Yes Need for family participation in patient care:   No  Care giving concerns: Patient reports she has been binge drinking since 2011/11/17 after the death of her spouse. Patient  went to Mandatory treatment at [facility] after receiving 2 DWI in 2015/2016. Patient reports lately binge drinking more sinces she has been under stress cause by her Adult Children and having to take care of her 50 month old grandchild. She reports her son has bipolar and at times does not take his medications. 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 She reports having to financially support her children which also causes stress.  Patient reports having anxiety attacks secondary to stress her from her children and "no reason at all." Patient reports she was prescribed Zoloft and Prozac but stopped taking it because she felt it was not working for her.  -Patient reports no prior counseling and is agreeable to see a counselor for her anxiety.    Social Worker assessment / plan:  CSW met with patient at bedside, explain role and reason for intervention-assist with substance abuse resources.CSW presented SA inpatient and  outpatient treatment options. Patient reports she cannot do residential treatment because she does not trust her adult children to be alone in her home.  Patient is agreeable to outpatient program care.  CSW explained [facility] information to patient, she plans to follow up.   Plan:Provide resources to [facility] [place] for patient to continue with Outpatient care.   Employment status:  Cytogeneticist information:  Self Pay (Medicaid Pending) PT Recommendations:    Information / Referral to community resources:  Outpatient Substance Abuse Treatment Options, Residential Substance Abuse Treatment Options  Patient/Family's Response to care:  Agreeable and Responding well to care. No family at bedside.   Patient/Family's Understanding of and Emotional Response to Diagnosis, Current Treatment, and Prognosis: " I just want my kids to take responsibility so that I can stop being so stressed." Patient reports stress is causing her binge drinking to increase. Patient is not yet ready to commit to [facility] inpatient facility.   Emotional Assessment Appearance:  Developmentally appropriate Attitude/Demeanor/Rapport:    Affect (typically observed):  Accepting, Calm Orientation:  Oriented to Self, Oriented to Place, Oriented to  Time, Oriented to Situation Alcohol / Substance use:  Alcohol Use Psych involvement (Current and /or in the community):  No (Comment)  Discharge Needs  Concerns to be addressed:  Substance Abuse Concerns, Care Coordination Readmission within the last 30 days:  No Current discharge risk:  None Barriers to Discharge:  Continued Medical Work up   [employer], LCSW 06/15/2017, 12:12 PM

## 2017-06-16 DIAGNOSIS — R443 Hallucinations, unspecified: Secondary | ICD-10-CM

## 2017-06-16 DIAGNOSIS — F10232 Alcohol dependence with withdrawal with perceptual disturbance: Principal | ICD-10-CM

## 2017-06-16 DIAGNOSIS — E872 Acidosis: Secondary | ICD-10-CM

## 2017-06-16 DIAGNOSIS — K2920 Alcoholic gastritis without bleeding: Secondary | ICD-10-CM

## 2017-06-16 DIAGNOSIS — F1020 Alcohol dependence, uncomplicated: Secondary | ICD-10-CM

## 2017-06-16 LAB — CBC
HEMATOCRIT: 29.7 % — AB (ref 36.0–46.0)
Hemoglobin: 10.3 g/dL — ABNORMAL LOW (ref 12.0–15.0)
MCH: 31.4 pg (ref 26.0–34.0)
MCHC: 34.7 g/dL (ref 30.0–36.0)
MCV: 90.5 fL (ref 78.0–100.0)
PLATELETS: 82 10*3/uL — AB (ref 150–400)
RBC: 3.28 MIL/uL — ABNORMAL LOW (ref 3.87–5.11)
RDW: 15.1 % (ref 11.5–15.5)
WBC: 4.3 10*3/uL (ref 4.0–10.5)

## 2017-06-16 LAB — COMPREHENSIVE METABOLIC PANEL
ALT: 24 U/L (ref 14–54)
AST: 38 U/L (ref 15–41)
Albumin: 3.1 g/dL — ABNORMAL LOW (ref 3.5–5.0)
Alkaline Phosphatase: 56 U/L (ref 38–126)
Anion gap: 10 (ref 5–15)
BILIRUBIN TOTAL: 0.4 mg/dL (ref 0.3–1.2)
BUN: 9 mg/dL (ref 6–20)
CHLORIDE: 101 mmol/L (ref 101–111)
CO2: 26 mmol/L (ref 22–32)
Calcium: 8.2 mg/dL — ABNORMAL LOW (ref 8.9–10.3)
Creatinine, Ser: 0.54 mg/dL (ref 0.44–1.00)
Glucose, Bld: 99 mg/dL (ref 65–99)
POTASSIUM: 3.4 mmol/L — AB (ref 3.5–5.1)
Sodium: 137 mmol/L (ref 135–145)
TOTAL PROTEIN: 5.4 g/dL — AB (ref 6.5–8.1)

## 2017-06-16 LAB — MAGNESIUM: MAGNESIUM: 1.9 mg/dL (ref 1.7–2.4)

## 2017-06-16 MED ORDER — METOPROLOL TARTRATE 12.5 MG HALF TABLET
12.5000 mg | ORAL_TABLET | Freq: Two times a day (BID) | ORAL | Status: DC
  Administered 2017-06-16 – 2017-06-17 (×3): 12.5 mg via ORAL
  Filled 2017-06-16 (×3): qty 1

## 2017-06-16 MED ORDER — POTASSIUM CHLORIDE CRYS ER 20 MEQ PO TBCR
40.0000 meq | EXTENDED_RELEASE_TABLET | Freq: Once | ORAL | Status: AC
  Administered 2017-06-16: 40 meq via ORAL
  Filled 2017-06-16: qty 2

## 2017-06-16 MED ORDER — TRAZODONE HCL 100 MG PO TABS
100.0000 mg | ORAL_TABLET | Freq: Every evening | ORAL | Status: DC | PRN
  Administered 2017-06-16: 100 mg via ORAL
  Filled 2017-06-16: qty 1

## 2017-06-16 MED ORDER — PANTOPRAZOLE SODIUM 40 MG PO TBEC
40.0000 mg | DELAYED_RELEASE_TABLET | Freq: Every day | ORAL | Status: DC
  Administered 2017-06-16 – 2017-06-17 (×2): 40 mg via ORAL
  Filled 2017-06-16 (×2): qty 1

## 2017-06-16 NOTE — Progress Notes (Signed)
Patient c/o feeling anxious, feeling like skin crawling and cannot rest. Reports she would like something else for anxiety. Patient was given Ativan  at 2306. Next scheduled CIWA is at 06. Last CIWA at 2306 was 10. Patient has been tachy through the evening in the low 100s. Patient has prn Ativan for CIWA >8 or withdrawal symptoms. Will check vitals and administer dose.

## 2017-06-16 NOTE — Progress Notes (Signed)
MD made aware of 17 beat run VTACH.

## 2017-06-16 NOTE — Progress Notes (Signed)
PROGRESS NOTE    Julie Conley  AVW:[id number] DOB: 1975/11/23 DOA: 06/14/2017 PCP: Patient, No Pcp Per    Brief Narrative:  41 year old female with a past medical history of alcoholism, history of acid reflux, presented with increased tremors, nausea, vomiting, diarrhea, hallucinations. She was noted to be in severe alcohol withdrawal. She was hospitalized for further management.  Assessment & Plan:   Principal Problem:   Alcohol dependence with withdrawal with complication [facility]) Active Problems:   Hyponatremia   Alcoholic ketoacidosis   Sinus tachycardia   Thrombocytopenia (HCC)   Alcohol withdrawal (HCC)   Alcohol withdrawal syndrome with hallucinations. Patient seems to be stable. Resting heart rate has improved. Continue CIWA protocol. Continue thiamine, multivitamins, folic acid. Pt agrees to counseling as outpateint -Stable at present  Hyponatremia/hyperkalemia/metabolic acidosis. Corrected with IVF Hold further IVF hydration Repeat bmet in AM  Nausea, vomiting and diarrhea. GI symptoms are improving. This could have been due to alcohol withdrawal. She is afebrile. Her lipase is normal. Continue PPI. Symptomatic treatment for now.  -Successfully advanced to regular diet  Thrombocytopenia. Suspecting bone marrow suppression from alcohol. Continue to monitor. There is no evidence for bleeding currently. -Repeat CBC in AM  NSVT -limited run of vtach noted on tele -Pt stable -Ordered Mg. Correct K -Will start low dose beta blocker. Continue to monitor  DVT prophylaxis: SCD's Code Status: Full Family Communication: Pt in room, family not at bedside Disposition Plan: Uncertain at this time  Consultants:     Procedures:     Antimicrobials: Anti-infectives    None       Subjective: Without complaints  Objective: Vitals:   06/16/17 0303 06/16/17 0535 06/16/17 0830 06/16/17 1158  BP: 123/68 134/89 135/83 (!) 131/96  Pulse: (!) 102 95 (!) 108    Resp: Temp: 98.4 F (36.9 C) 98.1 F (36.7 C) 98.7 F (37.1 C)   TempSrc: Oral Oral Oral   SpO2: 97% 99% 98%   Weight:      Height:        Intake/Output Summary (Last 24 hours) at 06/16/17 1453 Last data filed at 06/16/17 0915  Gross per 24 hour  Intake          2523.33 ml  Output              450 ml  Net          2073.33 ml   Filed Weights   06/14/17 1714 06/15/17 1456  Weight: 90.7 kg (200 lb) 89.4 kg (197 lb 1.5 oz)    Examination:  General exam: Appears calm and comfortable  Respiratory system: Clear to auscultation. Respiratory effort normal. Cardiovascular system: S1 & S2 heard, RRR Gastrointestinal system: Abdomen is nondistended, soft and nontender. No organomegaly or masses felt. Normal bowel sounds heard. Central nervous system: Alert and oriented. No focal neurological deficits. Extremities: Symmetric 5 x 5 power. Skin: No rashes, lesions Psychiatry: Judgement and insight appear normal. Mood & affect appropriate.   Data Reviewed: I have personally reviewed following labs and imaging studies  CBC:  Recent Labs Lab 06/14/17 1544 06/15/17 0324 06/16/17 0607  WBC 6.2 4.4 4.3  NEUTROABS 4.2  --   --   HGB 12.8 11.3* 10.3*  HCT 35.5* 31.9* 29.7*  MCV 87.9 89.6 90.5  PLT 125* 95* 82*   Basic Metabolic Panel:  Recent Labs Lab 06/14/17 1544 06/15/17 0324 06/16/17 0607  NA 130* 134* 137  K 3.6 3.3* 3.4*  CL 95* 100*  101  CO2 19* 24 26  GLUCOSE 104* 125* 99  BUN <5* 6 9  CREATININE 0.55 0.72 0.54  CALCIUM 8.5* 8.6* 8.2*  MG 2.1 2.0 1.9   GFR: Estimated Creatinine Clearance: 100.2 mL/min (by C-G formula based on SCr of 0.54 mg/dL). Liver Function Tests:  Recent Labs Lab 06/14/17 1544 06/15/17 0324 06/16/17 0607  AST 45* 44* 38  ALT ALKPHOS 78 67 56  BILITOT 0.8 0.8 0.4  PROT 6.9 6.2* 5.4*  ALBUMIN 4.0 3.5 3.1*    Recent Labs Lab 06/14/17 1544  LIPASE 41   No results for input(s): AMMONIA in the last 168  hours. Coagulation Profile:  Recent Labs Lab 06/15/17 0324  INR 0.93   Cardiac Enzymes: No results for input(s): CKTOTAL, CKMB, CKMBINDEX, TROPONINI in the last 168 hours. BNP (last 3 results) No results for input(s): PROBNP in the last 8760 hours. HbA1C: No results for input(s): HGBA1C in the last 72 hours. CBG: No results for input(s): GLUCAP in the last 168 hours. Lipid Profile: No results for input(s): CHOL, HDL, LDLCALC, TRIG, CHOLHDL, LDLDIRECT in the last 72 hours. Thyroid Function Tests: No results for input(s): TSH, T4TOTAL, FREET4, T3FREE, THYROIDAB in the last 72 hours. Anemia Panel: No results for input(s): VITAMINB12, FOLATE, FERRITIN, TIBC, IRON, RETICCTPCT in the last 72 hours. Sepsis Labs: No results for input(s): PROCALCITON, LATICACIDVEN in the last 168 hours.  No results found for this or any previous visit (from the past 240 hour(s)).   Radiology Studies: No results found.  Scheduled Meds: . folic acid  1 mg Oral Daily  . LORazepam  0-4 mg Intravenous Q6H   Followed by  . [START ON 06/17/2017] LORazepam  0-4 mg Intravenous Q12H  . metoprolol tartrate  12.5 mg Oral BID  . multivitamin with minerals  1 tablet Oral Daily  . nicotine  14 mg Transdermal Daily  . pantoprazole  40 mg Oral Daily  . sodium chloride flush  3 mL Intravenous Q12H  . thiamine  100 mg Oral Daily   Continuous Infusions:   LOS: 2 days   Raechell Singleton, Scheryl Marten, MD [facility] Pager [phone]  If 7PM-7AM, please contact night-coverage [email or web address] Password [email or web address] 06/16/2017, 2:53 PM

## 2017-06-17 LAB — CBC
HCT: 30.7 % — ABNORMAL LOW (ref 36.0–46.0)
HEMOGLOBIN: 10.6 g/dL — AB (ref 12.0–15.0)
MCH: 31.6 pg (ref 26.0–34.0)
MCHC: 34.5 g/dL (ref 30.0–36.0)
MCV: 91.6 fL (ref 78.0–100.0)
PLATELETS: 89 10*3/uL — AB (ref 150–400)
RBC: 3.35 MIL/uL — AB (ref 3.87–5.11)
RDW: 15.4 % (ref 11.5–15.5)
WBC: 4.6 10*3/uL (ref 4.0–10.5)

## 2017-06-17 LAB — COMPREHENSIVE METABOLIC PANEL
ALT: 26 U/L (ref 14–54)
ANION GAP: 9 (ref 5–15)
AST: 37 U/L (ref 15–41)
Albumin: 3.3 g/dL — ABNORMAL LOW (ref 3.5–5.0)
Alkaline Phosphatase: 55 U/L (ref 38–126)
BUN: 10 mg/dL (ref 6–20)
CHLORIDE: 100 mmol/L — AB (ref 101–111)
CO2: 29 mmol/L (ref 22–32)
CREATININE: 0.58 mg/dL (ref 0.44–1.00)
Calcium: 8.7 mg/dL — ABNORMAL LOW (ref 8.9–10.3)
Glucose, Bld: 84 mg/dL (ref 65–99)
Potassium: 3.8 mmol/L (ref 3.5–5.1)
SODIUM: 138 mmol/L (ref 135–145)
Total Bilirubin: 0.2 mg/dL — ABNORMAL LOW (ref 0.3–1.2)
Total Protein: 5.6 g/dL — ABNORMAL LOW (ref 6.5–8.1)

## 2017-06-17 MED ORDER — FOLIC ACID 1 MG PO TABS: 1.0000 mg | ORAL_TABLET | Freq: Every day | ORAL | 0 refills | Status: DC

## 2017-06-17 MED ORDER — METOPROLOL TARTRATE 25 MG PO TABS: 12.5000 mg | ORAL_TABLET | Freq: Two times a day (BID) | ORAL | 0 refills | Status: DC

## 2017-06-17 MED ORDER — PANTOPRAZOLE SODIUM 40 MG PO TBEC: 40.0000 mg | DELAYED_RELEASE_TABLET | Freq: Every day | ORAL | 0 refills | Status: DC

## 2017-06-17 NOTE — Progress Notes (Signed)
06/17/17  1700  Reviewed discharge instructions with patient. Patient verbalized understanding of discharge instructions. Copy of discharge instructions and prescriptions given to patient. Patient given [facility] pharm for her Metoprolol that she wanted to find at a cheaper price due to no insurance.

## 2017-06-17 NOTE — Discharge Summary (Signed)
Physician Discharge Summary  Nalanie Winiecki RUE:[id number] DOB: 1975-11-16 DOA: 06/14/2017  PCP: Patient, No Pcp Per  Admit date: 06/14/2017 Discharge date: 06/17/2017  Admitted From: Home Disposition:  Home  Recommendations for Outpatient Follow-up:  1. Follow up with PCP in 1-2 weeks  Discharge Condition:Improved CODE STATUS:Full Diet recommendation: Regular   Brief/Interim Summary: 41 year old female with a past medical history of alcoholism, history of acid reflux, presented with increased tremors, nausea, vomiting, diarrhea, hallucinations. She was noted to be in severe alcohol withdrawal. She was hospitalized for further management.  Discharge Diagnoses:  Principal Problem:   Alcohol dependence with withdrawal with complication [facility]) Active Problems:   Hyponatremia   Alcoholic ketoacidosis   Sinus tachycardia   Thrombocytopenia (HCC)   Alcohol withdrawal (HCC)  Alcohol withdrawal syndrome with hallucinations. Patient seems to be stable. Resting heart rate has improved. Continue CIWA protocol. Continue thiamine, multivitamins, folic acid. Pt agrees to counseling as outpateint -Patient remained stable  Hyponatremia/hyperkalemia/metabolic acidosis. Corrected with IVF Hold further IVF hydration  Nausea, vomiting and diarrhea. GI symptoms are improving. This could have beendue to alcohol withdrawal. She is afebrile. Her lipase is normal. Continue PPI. Symptomatic treatment for now.  -Successfully advanced to regular diet  Thrombocytopenia. Suspecting bone marrow suppression from alcohol. Continue to monitor. There is no evidence for bleeding currently. -Plts trending back up at time of discharge  NSVT -limited run of vtach noted on tele -Pt stable -Corrected electrolytes -Started low dose metoprolol. Remained stable the remainder of admission -Recommend follow up with PCP on discharge  Discharge Instructions   Allergies as of 06/17/2017      Reactions   Adhesive [tape] Other (See Comments)   TEARS SKIN   Ambien [zolpidem Tartrate] Other (See Comments)   Delirium, disorientation   Sulfa Antibiotics Itching      Medication List    TAKE these medications   famotidine 20 MG tablet Commonly known as:  PEPCID Take 1 tablet (20 mg total) by mouth 2 (two) times daily.   ferrous sulfate 325 (65 FE) MG EC tablet Take 1 tablet (325 mg total) by mouth 3 (three) times daily with meals.   folic acid 1 MG tablet Commonly known as:  FOLVITE Take 1 tablet (1 mg total) by mouth daily.   metoprolol tartrate 25 MG tablet Commonly known as:  LOPRESSOR Take 0.5 tablets (12.5 mg total) by mouth 2 (two) times daily.   multivitamin capsule Take 1 capsule by mouth daily.   pantoprazole 40 MG tablet Commonly known as:  PROTONIX Take 1 tablet (40 mg total) by mouth daily.      Follow-up Information    Follow up with your PCP in 1-2 weeks. Schedule an appointment as soon as possible for a visit.        Recommend follow up with outpatient alcohol rehab program Follow up.          Allergies  Allergen Reactions  . Adhesive [Tape] Other (See Comments)    TEARS SKIN  . Ambien [Zolpidem Tartrate] Other (See Comments)    Delirium, disorientation  . Sulfa Antibiotics Itching    Procedures/Studies: No results found.  Subjective: Without complaints  Discharge Exam: Vitals:   06/17/17 1002 06/17/17 1353  BP: 112/69 (!) 155/85  Pulse: 86 83  Resp: 20 18  Temp:    SpO2: 95% 97%   Vitals:   06/16/17 2315 06/17/17 0605 06/17/17 1002 06/17/17 1353  BP: (!) 145/87 131/82 112/69 (!) 155/85  Pulse: 95 77 86 83  Resp: Temp: 98.6 F (37 C) 97.9 F (36.6 C)    TempSrc: Oral Oral    SpO2: 98% 97% 95% 97%  Weight:      Height:        General: Pt is alert, awake, not in acute distress Cardiovascular: RRR, S1/S2 +, no rubs, no gallops Respiratory: CTA bilaterally, no wheezing, no rhonchi Abdominal: Soft, NT, ND, bowel  sounds + Extremities: no edema, no cyanosis   The results of significant diagnostics from this hospitalization (including imaging, microbiology, ancillary and laboratory) are listed below for reference.     Microbiology: No results found for this or any previous visit (from the past 240 hour(s)).   Labs: BNP (last 3 results) No results for input(s): BNP in the last 8760 hours. Basic Metabolic Panel:  Recent Labs Lab 06/14/17 1544 06/15/17 0324 06/16/17 0607 06/17/17 0541  NA 130* 134* 137 138  K 3.6 3.3* 3.4* 3.8  CL 95* 100* 101 100*  CO2 19* GLUCOSE 104* 125* 99 84  BUN <5* CREATININE 0.55 0.72 0.54 0.58  CALCIUM 8.5* 8.6* 8.2* 8.7*  MG 2.1 2.0 1.9  --    Liver Function Tests:  Recent Labs Lab 06/14/17 1544 06/15/17 0324 06/16/17 0607 06/17/17 0541  AST 45* 44* 38 37  ALT ALKPHOS 78 67 56 55  BILITOT 0.8 0.8 0.4 0.2*  PROT 6.9 6.2* 5.4* 5.6*  ALBUMIN 4.0 3.5 3.1* 3.3*    Recent Labs Lab 06/14/17 1544  LIPASE 41   No results for input(s): AMMONIA in the last 168 hours. CBC:  Recent Labs Lab 06/14/17 1544 06/15/17 0324 06/16/17 0607 06/17/17 0541  WBC 6.2 4.4 4.3 4.6  NEUTROABS 4.2  --   --   --   HGB 12.8 11.3* 10.3* 10.6*  HCT 35.5* 31.9* 29.7* 30.7*  MCV 87.9 89.6 90.5 91.6  PLT 125* 95* 82* 89*   Cardiac Enzymes: No results for input(s): CKTOTAL, CKMB, CKMBINDEX, TROPONINI in the last 168 hours. BNP: Invalid input(s): POCBNP CBG: No results for input(s): GLUCAP in the last 168 hours. D-Dimer No results for input(s): DDIMER in the last 72 hours. Hgb A1c No results for input(s): HGBA1C in the last 72 hours. Lipid Profile No results for input(s): CHOL, HDL, LDLCALC, TRIG, CHOLHDL, LDLDIRECT in the last 72 hours. Thyroid function studies No results for input(s): TSH, T4TOTAL, T3FREE, THYROIDAB in the last 72 hours.  Invalid input(s): FREET3 Anemia work up No results for input(s): VITAMINB12, FOLATE,  FERRITIN, TIBC, IRON, RETICCTPCT in the last 72 hours. Urinalysis    Component Value Date/Time   COLORURINE YELLOW 06/14/2017 2058   APPEARANCEUR HAZY (A) 06/14/2017 2058   LABSPEC 1.014 06/14/2017 2058   PHURINE 6.0 06/14/2017 2058   GLUCOSEU NEGATIVE 06/14/2017 2058   HGBUR NEGATIVE 06/14/2017 2058   BILIRUBINUR NEGATIVE 06/14/2017 2058   KETONESUR 20 (A) 06/14/2017 2058   PROTEINUR 30 (A) 06/14/2017 2058   UROBILINOGEN 1.0 02/26/2013 1916   NITRITE NEGATIVE 06/14/2017 2058   LEUKOCYTESUR NEGATIVE 06/14/2017 2058   Sepsis Labs Invalid input(s): PROCALCITONIN,  WBC,  LACTICIDVEN Microbiology No results found for this or any previous visit (from the past 240 hour(s)).   SIGNED:   Jerald Kief, MD  [facility] 06/17/2017, 3:24 PM  If 7PM-7AM, please contact night-coverage [email or web address] Password [email or web address]

## 2017-07-20 ENCOUNTER — Inpatient Hospital Stay (HOSPITAL_COMMUNITY): Payer: Self-pay

## 2017-07-20 ENCOUNTER — Encounter (HOSPITAL_COMMUNITY): Payer: Self-pay

## 2017-07-20 ENCOUNTER — Inpatient Hospital Stay (HOSPITAL_COMMUNITY)
Admission: EM | Admit: 2017-07-20 | Discharge: 2017-07-27 | DRG: 896 | Disposition: A | Discharge status: Home Health | Payer: Self-pay | Attending: Internal Medicine | Admitting: Internal Medicine

## 2017-07-20 ENCOUNTER — Emergency Department (HOSPITAL_COMMUNITY): Payer: Self-pay

## 2017-07-20 DIAGNOSIS — F1094 Alcohol use, unspecified with alcohol-induced mood disorder: Secondary | ICD-10-CM

## 2017-07-20 DIAGNOSIS — X58XXXA Exposure to other specified factors, initial encounter: Secondary | ICD-10-CM | POA: Diagnosis present

## 2017-07-20 DIAGNOSIS — Z79899 Other long term (current) drug therapy: Secondary | ICD-10-CM

## 2017-07-20 DIAGNOSIS — T148XXA Other injury of unspecified body region, initial encounter: Secondary | ICD-10-CM

## 2017-07-20 DIAGNOSIS — Z9884 Bariatric surgery status: Secondary | ICD-10-CM

## 2017-07-20 DIAGNOSIS — R4182 Altered mental status, unspecified: Secondary | ICD-10-CM

## 2017-07-20 DIAGNOSIS — Z882 Allergy status to sulfonamides status: Secondary | ICD-10-CM

## 2017-07-20 DIAGNOSIS — E039 Hypothyroidism, unspecified: Secondary | ICD-10-CM | POA: Diagnosis present

## 2017-07-20 DIAGNOSIS — J9600 Acute respiratory failure, unspecified whether with hypoxia or hypercapnia: Secondary | ICD-10-CM | POA: Diagnosis present

## 2017-07-20 DIAGNOSIS — S80812A Abrasion, left lower leg, initial encounter: Secondary | ICD-10-CM | POA: Diagnosis present

## 2017-07-20 DIAGNOSIS — F10229 Alcohol dependence with intoxication, unspecified: Secondary | ICD-10-CM | POA: Diagnosis present

## 2017-07-20 DIAGNOSIS — F419 Anxiety disorder, unspecified: Secondary | ICD-10-CM | POA: Diagnosis present

## 2017-07-20 DIAGNOSIS — Z4659 Encounter for fitting and adjustment of other gastrointestinal appliance and device: Secondary | ICD-10-CM

## 2017-07-20 DIAGNOSIS — E869 Volume depletion, unspecified: Secondary | ICD-10-CM | POA: Diagnosis present

## 2017-07-20 DIAGNOSIS — J690 Pneumonitis due to inhalation of food and vomit: Secondary | ICD-10-CM | POA: Diagnosis present

## 2017-07-20 DIAGNOSIS — G9341 Metabolic encephalopathy: Secondary | ICD-10-CM | POA: Diagnosis present

## 2017-07-20 DIAGNOSIS — R159 Full incontinence of feces: Secondary | ICD-10-CM | POA: Diagnosis present

## 2017-07-20 DIAGNOSIS — L89892 Pressure ulcer of other site, stage 2: Secondary | ICD-10-CM | POA: Diagnosis present

## 2017-07-20 DIAGNOSIS — N179 Acute kidney failure, unspecified: Secondary | ICD-10-CM | POA: Diagnosis present

## 2017-07-20 DIAGNOSIS — J9690 Respiratory failure, unspecified, unspecified whether with hypoxia or hypercapnia: Secondary | ICD-10-CM

## 2017-07-20 DIAGNOSIS — J9601 Acute respiratory failure with hypoxia: Secondary | ICD-10-CM

## 2017-07-20 DIAGNOSIS — R600 Localized edema: Secondary | ICD-10-CM

## 2017-07-20 DIAGNOSIS — F1010 Alcohol abuse, uncomplicated: Secondary | ICD-10-CM

## 2017-07-20 DIAGNOSIS — G9340 Encephalopathy, unspecified: Secondary | ICD-10-CM | POA: Diagnosis present

## 2017-07-20 DIAGNOSIS — F17210 Nicotine dependence, cigarettes, uncomplicated: Secondary | ICD-10-CM | POA: Diagnosis present

## 2017-07-20 DIAGNOSIS — R32 Unspecified urinary incontinence: Secondary | ICD-10-CM | POA: Diagnosis present

## 2017-07-20 DIAGNOSIS — E860 Dehydration: Secondary | ICD-10-CM

## 2017-07-20 DIAGNOSIS — I10 Essential (primary) hypertension: Secondary | ICD-10-CM | POA: Diagnosis present

## 2017-07-20 DIAGNOSIS — Z91048 Other nonmedicinal substance allergy status: Secondary | ICD-10-CM

## 2017-07-20 DIAGNOSIS — F10239 Alcohol dependence with withdrawal, unspecified: Secondary | ICD-10-CM | POA: Diagnosis present

## 2017-07-20 DIAGNOSIS — Z931 Gastrostomy status: Secondary | ICD-10-CM

## 2017-07-20 DIAGNOSIS — L89302 Pressure ulcer of unspecified buttock, stage 2: Secondary | ICD-10-CM | POA: Diagnosis present

## 2017-07-20 DIAGNOSIS — A419 Sepsis, unspecified organism: Secondary | ICD-10-CM | POA: Diagnosis present

## 2017-07-20 DIAGNOSIS — Z811 Family history of alcohol abuse and dependence: Secondary | ICD-10-CM

## 2017-07-20 DIAGNOSIS — Z91040 Latex allergy status: Secondary | ICD-10-CM

## 2017-07-20 DIAGNOSIS — R739 Hyperglycemia, unspecified: Secondary | ICD-10-CM | POA: Diagnosis present

## 2017-07-20 DIAGNOSIS — E781 Pure hyperglyceridemia: Secondary | ICD-10-CM | POA: Diagnosis present

## 2017-07-20 DIAGNOSIS — E861 Hypovolemia: Secondary | ICD-10-CM | POA: Diagnosis present

## 2017-07-20 DIAGNOSIS — F329 Major depressive disorder, single episode, unspecified: Secondary | ICD-10-CM | POA: Diagnosis present

## 2017-07-20 DIAGNOSIS — Z638 Other specified problems related to primary support group: Secondary | ICD-10-CM

## 2017-07-20 DIAGNOSIS — E875 Hyperkalemia: Secondary | ICD-10-CM | POA: Diagnosis present

## 2017-07-20 DIAGNOSIS — F1024 Alcohol dependence with alcohol-induced mood disorder: Principal | ICD-10-CM | POA: Diagnosis present

## 2017-07-20 DIAGNOSIS — K219 Gastro-esophageal reflux disease without esophagitis: Secondary | ICD-10-CM | POA: Diagnosis present

## 2017-07-20 DIAGNOSIS — R6889 Other general symptoms and signs: Secondary | ICD-10-CM

## 2017-07-20 DIAGNOSIS — J9811 Atelectasis: Secondary | ICD-10-CM

## 2017-07-20 DIAGNOSIS — Z888 Allergy status to other drugs, medicaments and biological substances status: Secondary | ICD-10-CM

## 2017-07-20 DIAGNOSIS — E878 Other disorders of electrolyte and fluid balance, not elsewhere classified: Secondary | ICD-10-CM | POA: Diagnosis present

## 2017-07-20 DIAGNOSIS — S30810A Abrasion of lower back and pelvis, initial encounter: Secondary | ICD-10-CM | POA: Diagnosis present

## 2017-07-20 DIAGNOSIS — R40243 Glasgow coma scale score 3-8, unspecified time: Secondary | ICD-10-CM | POA: Diagnosis present

## 2017-07-20 DIAGNOSIS — Z23 Encounter for immunization: Secondary | ICD-10-CM

## 2017-07-20 DIAGNOSIS — E871 Hypo-osmolality and hyponatremia: Secondary | ICD-10-CM | POA: Diagnosis present

## 2017-07-20 DIAGNOSIS — D509 Iron deficiency anemia, unspecified: Secondary | ICD-10-CM | POA: Diagnosis present

## 2017-07-20 DIAGNOSIS — S80811A Abrasion, right lower leg, initial encounter: Secondary | ICD-10-CM | POA: Diagnosis present

## 2017-07-20 DIAGNOSIS — F32A Depression, unspecified: Secondary | ICD-10-CM | POA: Diagnosis present

## 2017-07-20 DIAGNOSIS — Z62810 Personal history of physical and sexual abuse in childhood: Secondary | ICD-10-CM | POA: Diagnosis present

## 2017-07-20 DIAGNOSIS — S30814A Abrasion of vagina and vulva, initial encounter: Secondary | ICD-10-CM | POA: Diagnosis present

## 2017-07-20 DIAGNOSIS — R197 Diarrhea, unspecified: Secondary | ICD-10-CM | POA: Diagnosis present

## 2017-07-20 DIAGNOSIS — L8990 Pressure ulcer of unspecified site, unspecified stage: Secondary | ICD-10-CM

## 2017-07-20 DIAGNOSIS — F4310 Post-traumatic stress disorder, unspecified: Secondary | ICD-10-CM | POA: Diagnosis present

## 2017-07-20 LAB — RAPID URINE DRUG SCREEN, HOSP PERFORMED
Amphetamines: NOT DETECTED
Barbiturates: NOT DETECTED
Benzodiazepines: NOT DETECTED
COCAINE: NOT DETECTED
OPIATES: NOT DETECTED
Tetrahydrocannabinol: NOT DETECTED

## 2017-07-20 LAB — COMPREHENSIVE METABOLIC PANEL
ALBUMIN: 2.5 g/dL — AB (ref 3.5–5.0)
ALK PHOS: 86 U/L (ref 38–126)
ALT: 31 U/L (ref 14–54)
ALT: 26 U/L (ref 14–54)
ANION GAP: 15 (ref 5–15)
AST: 49 U/L — AB (ref 15–41)
AST: 41 U/L (ref 15–41)
Albumin: 2.9 g/dL — ABNORMAL LOW (ref 3.5–5.0)
Alkaline Phosphatase: 100 U/L (ref 38–126)
Anion gap: 14 (ref 5–15)
BILIRUBIN TOTAL: 1.7 mg/dL — AB (ref 0.3–1.2)
BILIRUBIN TOTAL: 1.4 mg/dL — AB (ref 0.3–1.2)
BUN: 37 mg/dL — ABNORMAL HIGH (ref 6–20)
BUN: 34 mg/dL — ABNORMAL HIGH (ref 6–20)
CALCIUM: 7.8 mg/dL — AB (ref 8.9–10.3)
CO2: 16 mmol/L — AB (ref 22–32)
CO2: 10 mmol/L — ABNORMAL LOW (ref 22–32)
CREATININE: 1.42 mg/dL — AB (ref 0.44–1.00)
Calcium: 8.3 mg/dL — ABNORMAL LOW (ref 8.9–10.3)
Chloride: 82 mmol/L — ABNORMAL LOW (ref 101–111)
Chloride: 90 mmol/L — ABNORMAL LOW (ref 101–111)
Creatinine, Ser: 1.01 mg/dL — ABNORMAL HIGH (ref 0.44–1.00)
GFR calc Af Amer: 52 mL/min — ABNORMAL LOW (ref >60)
GFR calc non Af Amer: >60 mL/min (ref >60)
GFR, EST NON AFRICAN AMERICAN: 45 mL/min — AB (ref >60)
GLUCOSE: 155 mg/dL — AB (ref 65–99)
Glucose, Bld: 190 mg/dL — ABNORMAL HIGH (ref 65–99)
POTASSIUM: 4.9 mmol/L (ref 3.5–5.1)
Potassium: 5.9 mmol/L — ABNORMAL HIGH (ref 3.5–5.1)
Sodium: 112 mmol/L — CL (ref 135–145)
Sodium: 115 mmol/L — CL (ref 135–145)
TOTAL PROTEIN: 5.8 g/dL — AB (ref 6.5–8.1)
Total Protein: 6.6 g/dL (ref 6.5–8.1)

## 2017-07-20 LAB — ETHANOL: Alcohol, Ethyl (B): <10 mg/dL (ref <10)

## 2017-07-20 LAB — I-STAT ARTERIAL BLOOD GAS, ED
ACID-BASE DEFICIT: 4 mmol/L — AB (ref 0.0–2.0)
Bicarbonate: 15.8 mmol/L — ABNORMAL LOW (ref 20.0–28.0)
O2 SAT: 100 %
PH ART: 7.49 — AB (ref 7.350–7.450)
Patient temperature: 38
TCO2: 16 mmol/L — ABNORMAL LOW (ref 22–32)
pCO2 arterial: 20.9 mmHg — ABNORMAL LOW (ref 32.0–48.0)
pO2, Arterial: 399 mmHg — ABNORMAL HIGH (ref 83.0–108.0)

## 2017-07-20 LAB — CBC WITH DIFFERENTIAL/PLATELET
BASOS PCT: 0 %
Basophils Absolute: 0 10*3/uL (ref 0.0–0.1)
EOS ABS: 0 10*3/uL (ref 0.0–0.7)
EOS PCT: 0 %
HEMATOCRIT: 43.6 % (ref 36.0–46.0)
Hemoglobin: 16.4 g/dL — ABNORMAL HIGH (ref 12.0–15.0)
LYMPHS PCT: 19 %
Lymphs Abs: 1.7 10*3/uL (ref 0.7–4.0)
MCH: 32 pg (ref 26.0–34.0)
MCHC: 37.6 g/dL — ABNORMAL HIGH (ref 30.0–36.0)
MCV: 85 fL (ref 78.0–100.0)
MONO ABS: 1.6 10*3/uL — AB (ref 0.1–1.0)
Monocytes Relative: 18 %
NEUTROS PCT: 63 %
Neutro Abs: 5.6 10*3/uL (ref 1.7–7.7)
PLATELETS: 178 10*3/uL (ref 150–400)
RBC: 5.13 MIL/uL — AB (ref 3.87–5.11)
RDW: 15.3 % (ref 11.5–15.5)
WBC MORPHOLOGY: INCREASED
WBC: 8.9 10*3/uL (ref 4.0–10.5)

## 2017-07-20 LAB — URINALYSIS, ROUTINE W REFLEX MICROSCOPIC
GLUCOSE, UA: NEGATIVE mg/dL
Ketones, ur: 5 mg/dL — AB
LEUKOCYTES UA: NEGATIVE
NITRITE: NEGATIVE
PH: 5 (ref 5.0–8.0)
PROTEIN: 100 mg/dL — AB
SPECIFIC GRAVITY, URINE: 1.026 (ref 1.005–1.030)

## 2017-07-20 LAB — BASIC METABOLIC PANEL
ANION GAP: 12 (ref 5–15)
ANION GAP: 14 (ref 5–15)
BUN: 34 mg/dL — ABNORMAL HIGH (ref 6–20)
BUN: 28 mg/dL — ABNORMAL HIGH (ref 6–20)
CALCIUM: 7.5 mg/dL — AB (ref 8.9–10.3)
CHLORIDE: 90 mmol/L — AB (ref 101–111)
CO2: 12 mmol/L — AB (ref 22–32)
CO2: 12 mmol/L — AB (ref 22–32)
CREATININE: 0.92 mg/dL (ref 0.44–1.00)
Calcium: 7.6 mg/dL — ABNORMAL LOW (ref 8.9–10.3)
Chloride: 91 mmol/L — ABNORMAL LOW (ref 101–111)
Creatinine, Ser: 0.99 mg/dL (ref 0.44–1.00)
GFR calc Af Amer: >60 mL/min (ref >60)
GFR calc Af Amer: >60 mL/min (ref >60)
GLUCOSE: 158 mg/dL — AB (ref 65–99)
GLUCOSE: 137 mg/dL — AB (ref 65–99)
POTASSIUM: 4.9 mmol/L (ref 3.5–5.1)
Potassium: 4.8 mmol/L (ref 3.5–5.1)
Sodium: 114 mmol/L — CL (ref 135–145)
Sodium: 117 mmol/L — CL (ref 135–145)

## 2017-07-20 LAB — CORTISOL: CORTISOL PLASMA: 30.6 ug/dL

## 2017-07-20 LAB — I-STAT CHEM 8, ED
BUN: 40 mg/dL — ABNORMAL HIGH (ref 6–20)
CREATININE: 1.2 mg/dL — AB (ref 0.44–1.00)
Calcium, Ion: 0.98 mmol/L — ABNORMAL LOW (ref 1.15–1.40)
Chloride: 86 mmol/L — ABNORMAL LOW (ref 101–111)
GLUCOSE: 188 mg/dL — AB (ref 65–99)
HCT: 50 % — ABNORMAL HIGH (ref 36.0–46.0)
HEMOGLOBIN: 17 g/dL — AB (ref 12.0–15.0)
Potassium: 5.8 mmol/L — ABNORMAL HIGH (ref 3.5–5.1)
SODIUM: 112 mmol/L — AB (ref 135–145)
TCO2: 18 mmol/L — ABNORMAL LOW (ref 22–32)

## 2017-07-20 LAB — TROPONIN I: Troponin I: <0.03 ng/mL (ref <0.03)

## 2017-07-20 LAB — PROTIME-INR
INR: 0.98
INR: 1.06
PROTHROMBIN TIME: 12.9 s (ref 11.4–15.2)
PROTHROMBIN TIME: 13.7 s (ref 11.4–15.2)

## 2017-07-20 LAB — OSMOLALITY: Osmolality: 257 mOsm/kg — ABNORMAL LOW (ref 275–295)

## 2017-07-20 LAB — SODIUM, URINE, RANDOM: Sodium, Ur: <10 mmol/L

## 2017-07-20 LAB — TRIGLYCERIDES: Triglycerides: 639 mg/dL — ABNORMAL HIGH (ref <150)

## 2017-07-20 LAB — I-STAT CG4 LACTIC ACID, ED: LACTIC ACID, VENOUS: 2.84 mmol/L — AB (ref 0.5–1.9)

## 2017-07-20 LAB — MRSA PCR SCREENING: MRSA by PCR: NEGATIVE

## 2017-07-20 LAB — PHOSPHORUS: Phosphorus: 2 mg/dL — ABNORMAL LOW (ref 2.5–4.6)

## 2017-07-20 LAB — PROCALCITONIN: PROCALCITONIN: 2.3 ng/mL

## 2017-07-20 LAB — GLUCOSE, CAPILLARY: Glucose-Capillary: 161 mg/dL — ABNORMAL HIGH (ref 65–99)

## 2017-07-20 LAB — I-STAT BETA HCG BLOOD, ED (MC, WL, AP ONLY): I-stat hCG, quantitative: <5 m[IU]/mL (ref <5)

## 2017-07-20 LAB — C DIFFICILE QUICK SCREEN W PCR REFLEX
C DIFFICILE (CDIFF) INTERP: NOT DETECTED
C DIFFICILE (CDIFF) TOXIN: NEGATIVE
C DIFFICLE (CDIFF) ANTIGEN: NEGATIVE

## 2017-07-20 LAB — LACTIC ACID, PLASMA
Lactic Acid, Venous: 2.4 mmol/L (ref 0.5–1.9)
Lactic Acid, Venous: 2.7 mmol/L (ref 0.5–1.9)

## 2017-07-20 LAB — APTT: APTT: 28 s (ref 24–36)

## 2017-07-20 LAB — LIPASE, BLOOD: LIPASE: 48 U/L (ref 11–51)

## 2017-07-20 LAB — MAGNESIUM
Magnesium: 2.7 mg/dL — ABNORMAL HIGH (ref 1.7–2.4)
Magnesium: 2.4 mg/dL (ref 1.7–2.4)

## 2017-07-20 LAB — CK TOTAL AND CKMB (NOT AT ARMC)
CK TOTAL: 300 U/L — AB (ref 38–234)
CK, MB: 8 ng/mL — ABNORMAL HIGH (ref 0.5–5.0)
Relative Index: 2.7 — ABNORMAL HIGH (ref 0.0–2.5)

## 2017-07-20 LAB — OSMOLALITY, URINE: Osmolality, Ur: 732 mOsm/kg (ref 300–900)

## 2017-07-20 LAB — SALICYLATE LEVEL

## 2017-07-20 LAB — CK: Total CK: 276 U/L — ABNORMAL HIGH (ref 38–234)

## 2017-07-20 LAB — TSH: TSH: 10.983 u[IU]/mL — ABNORMAL HIGH (ref 0.350–4.500)

## 2017-07-20 LAB — ACETAMINOPHEN LEVEL

## 2017-07-20 MED ORDER — SODIUM CHLORIDE 0.9 % IV BOLUS (SEPSIS)
1000.0000 mL | Freq: Once | INTRAVENOUS | Status: AC
  Administered 2017-07-21: 1000 mL via INTRAVENOUS

## 2017-07-20 MED ORDER — ASPIRIN 300 MG RE SUPP: 300.0000 mg | RECTAL | Status: DC

## 2017-07-20 MED ORDER — ORAL CARE MOUTH RINSE
15.0000 mL | Freq: Four times a day (QID) | OROMUCOSAL | Status: DC
  Administered 2017-07-21 – 2017-07-22 (×4): 15 mL via OROMUCOSAL

## 2017-07-20 MED ORDER — GERHARDT'S BUTT CREAM
TOPICAL_CREAM | Freq: Four times a day (QID) | CUTANEOUS | Status: DC
  Administered 2017-07-20: 1 via TOPICAL
  Administered 2017-07-21: 18:00:00 via TOPICAL
  Administered 2017-07-21: 1 via TOPICAL
  Administered 2017-07-21 (×2): via TOPICAL
  Administered 2017-07-22: 1 via TOPICAL
  Administered 2017-07-22 (×2): via TOPICAL
  Administered 2017-07-22: 1 via TOPICAL
  Administered 2017-07-23: 18:00:00 via TOPICAL
  Administered 2017-07-23 – 2017-07-24 (×2): 1 via TOPICAL
  Administered 2017-07-24 – 2017-07-27 (×10): via TOPICAL
  Filled 2017-07-20 (×11): qty 1

## 2017-07-20 MED ORDER — CHLORHEXIDINE GLUCONATE 0.12% ORAL RINSE (MEDLINE KIT): 15.0000 mL | Freq: Two times a day (BID) | OROMUCOSAL | Status: DC

## 2017-07-20 MED ORDER — ACETAMINOPHEN 650 MG RE SUPP: 650.0000 mg | Freq: Four times a day (QID) | RECTAL | Status: DC | PRN

## 2017-07-20 MED ORDER — SODIUM CHLORIDE 0.9 % IV BOLUS (SEPSIS)
1000.0000 mL | Freq: Once | INTRAVENOUS | Status: AC
  Administered 2017-07-20: 1000 mL via INTRAVENOUS

## 2017-07-20 MED ORDER — FOLIC ACID 1 MG PO TABS
1.0000 mg | ORAL_TABLET | Freq: Every day | ORAL | Status: DC
  Administered 2017-07-21: 1 mg via ORAL
  Filled 2017-07-20 (×2): qty 1

## 2017-07-20 MED ORDER — ORAL CARE MOUTH RINSE: 15.0000 mL | OROMUCOSAL | Status: DC

## 2017-07-20 MED ORDER — SODIUM CHLORIDE 0.9 % IV SOLN
INTRAVENOUS | Status: DC
  Administered 2017-07-20 – 2017-07-23 (×2): via INTRAVENOUS

## 2017-07-20 MED ORDER — SODIUM CHLORIDE 0.9 % IV SOLN: 250.0000 mL | INTRAVENOUS | Status: DC | PRN

## 2017-07-20 MED ORDER — CHLORHEXIDINE GLUCONATE 0.12% ORAL RINSE (MEDLINE KIT)
15.0000 mL | Freq: Two times a day (BID) | OROMUCOSAL | Status: DC
  Administered 2017-07-20 – 2017-07-21 (×3): 15 mL via OROMUCOSAL

## 2017-07-20 MED ORDER — PIPERACILLIN-TAZOBACTAM 3.375 G IVPB
3.3750 g | Freq: Three times a day (TID) | INTRAVENOUS | Status: DC
  Administered 2017-07-20 – 2017-07-25 (×14): 3.375 g via INTRAVENOUS
  Filled 2017-07-20 (×16): qty 50

## 2017-07-20 MED ORDER — SODIUM CHLORIDE 0.9 % IV BOLUS (SEPSIS): 1000.0000 mL | Freq: Once | INTRAVENOUS | Status: DC

## 2017-07-20 MED ORDER — VITAMIN B-1 100 MG PO TABS
100.0000 mg | ORAL_TABLET | Freq: Every day | ORAL | Status: DC
  Administered 2017-07-21: 100 mg via ORAL
  Filled 2017-07-20 (×2): qty 1

## 2017-07-20 MED ORDER — ADULT MULTIVITAMIN W/MINERALS CH
1.0000 | ORAL_TABLET | Freq: Every day | ORAL | Status: DC
  Administered 2017-07-21: 1 via ORAL
  Filled 2017-07-20 (×2): qty 1

## 2017-07-20 MED ORDER — SODIUM PHOSPHATES 45 MMOLE/15ML IV SOLN
10.0000 mmol | Freq: Once | INTRAVENOUS | Status: AC
  Administered 2017-07-21: 10 mmol via INTRAVENOUS
  Filled 2017-07-20: qty 3.33

## 2017-07-20 MED ORDER — FENTANYL CITRATE (PF) 100 MCG/2ML IJ SOLN: 100.0000 ug | INTRAMUSCULAR | Status: DC | PRN

## 2017-07-20 MED ORDER — PROPOFOL 1000 MG/100ML IV EMUL
0.0000 ug/kg/min | INTRAVENOUS | Status: DC
  Administered 2017-07-20: 15 ug/kg/min via INTRAVENOUS
  Administered 2017-07-20: 40 ug/kg/min via INTRAVENOUS
  Administered 2017-07-20: 30 ug/kg/min via INTRAVENOUS
  Administered 2017-07-21: 50 ug/kg/min via INTRAVENOUS
  Filled 2017-07-20 (×5): qty 100

## 2017-07-20 MED ORDER — MIDAZOLAM HCL 2 MG/2ML IJ SOLN
1.0000 mg | INTRAMUSCULAR | Status: DC | PRN
  Filled 2017-07-20: qty 2

## 2017-07-20 MED ORDER — ORAL CARE MOUTH RINSE
15.0000 mL | Freq: Four times a day (QID) | OROMUCOSAL | Status: DC
  Administered 2017-07-21: 15 mL via OROMUCOSAL

## 2017-07-20 MED ORDER — PIPERACILLIN-TAZOBACTAM 3.375 G IVPB 30 MIN
3.3750 g | Freq: Once | INTRAVENOUS | Status: DC
  Filled 2017-07-20: qty 50

## 2017-07-20 MED ORDER — VANCOMYCIN HCL 10 G IV SOLR
1750.0000 mg | Freq: Once | INTRAVENOUS | Status: AC
  Administered 2017-07-20: 1750 mg via INTRAVENOUS
  Filled 2017-07-20 (×2): qty 1750

## 2017-07-20 MED ORDER — PANTOPRAZOLE SODIUM 40 MG IV SOLR
40.0000 mg | Freq: Two times a day (BID) | INTRAVENOUS | Status: DC
  Administered 2017-07-20 – 2017-07-22 (×5): 40 mg via INTRAVENOUS
  Filled 2017-07-20 (×7): qty 40

## 2017-07-20 MED ORDER — ACETAMINOPHEN 160 MG/5ML PO SOLN
650.0000 mg | Freq: Four times a day (QID) | ORAL | Status: DC | PRN
  Administered 2017-07-20: 650 mg
  Filled 2017-07-20: qty 20.3

## 2017-07-20 MED ORDER — SODIUM CHLORIDE 0.9 % IV SOLN: INTRAVENOUS | Status: DC

## 2017-07-20 MED ORDER — PANTOPRAZOLE SODIUM 40 MG IV SOLR: 40.0000 mg | Freq: Every day | INTRAVENOUS | Status: DC

## 2017-07-20 MED ORDER — ASPIRIN 81 MG PO CHEW: 324.0000 mg | CHEWABLE_TABLET | ORAL | Status: DC

## 2017-07-20 MED ORDER — VANCOMYCIN HCL IN DEXTROSE 1-5 GM/200ML-% IV SOLN
1000.0000 mg | Freq: Two times a day (BID) | INTRAVENOUS | Status: DC
  Administered 2017-07-21: 1000 mg via INTRAVENOUS
  Filled 2017-07-20 (×2): qty 200

## 2017-07-20 NOTE — ED Triage Notes (Signed)
Pt arrives EMS from home with decreased LOC and non responsive. Pt minimal response to pain and decreased resp response Anterior leads indicate st elevation per EMS. Pt is incontinent of stool and bladder. Pt Unknown down time and unknown amount of ETOH on board. EMS states multiple 40"s on bed by pt.

## 2017-07-20 NOTE — ED Notes (Signed)
1320 7.5 ET placed with glidascope. 22 cm at lips.

## 2017-07-20 NOTE — ED Notes (Signed)
Pt cleaned of copious amount of old and new stool. Pt has large , basketball size areas of skin that is excoriated to the point that it is abraided and bleeding from mid back to knees. Bilateral thighs and perineum also excoriated and bleeding.Pt has abraided area in linear shape approx 1 inch x 12 inches in length along lower abdomen that was covered by her pajama pants elastic waistband.

## 2017-07-20 NOTE — Progress Notes (Signed)
Pt has a bed assignment and a STAT CT ordered. Unavailable for EEG at this time. Will attempt as schedule permits.

## 2017-07-20 NOTE — Consult Note (Signed)
WOC nurse consulted for severe moisture associated skin damage associated with being found down incontinent of urine and feces. Unsure of her time down.  She has trauma from her beltline of her pants which had to be cut off of her. 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 ED staff are in the room, they need order for FMS to keep her clean from bowel incontinence and she has a foley catheter in place for urinary incontinence.  She is intubated and sedated and no family is present in the room.  The ED nurses report serve skin denudation from the exposure to incontinence, so much so that it is bleeding.  Unfortunately the only topical care recommended would be barrier cream to protect the skin from further exposure and management of moisture which is being done appropriately. There is a long ED wait time currently and it is not clear how long it will be before the patient can be moved to an ICU room.  Therefore I have requested a low air loss mattress/bed to be ordered for use for the patient while in the ED due to her vunerable skin.    WOC nurse will follow up when patient in an ICU room and can be turned more effectively for further assessment.  Jazelle Achey [facility] MSN, RN,CWOCN, CNS, [employer] [phone]

## 2017-07-20 NOTE — ED Notes (Signed)
Millie RN made aware of critical values.

## 2017-07-20 NOTE — Progress Notes (Signed)
eLink Physician-Brief Progress Note Patient Name: Julie Conley DOB: 26-Jun-1976 MRN: [id number]   Date of Service  07/20/2017  HPI/Events of Note  Multiple issues: 1. Lactic Acid = 2.7, 2. Na+ = 114 --> 117 and 3. Review KUB for gastric tube placement - Gastric tube in proximal stomach.   eICU Interventions  Will order: 1. Bolus with 0.9 NaCl 1 liter IV over 1 hour now.  2. Continue present IV fluid management.  3. OK to use gastric tube for medications and enteral nutrition if ordered.      Intervention Category Major Interventions: Electrolyte abnormality - evaluation and management;Acid-Base disturbance - evaluation and management  Sommer,Steven Eugene 07/20/2017, 11:08 PM

## 2017-07-20 NOTE — Procedures (Signed)
History: 41 yo F with AMS  Sedation: fentanyl/versed  Technique: This is a 21 channel routine scalp EEG performed at the bedside with bipolar and monopolar montages arranged in accordance to the international 10/20 system of electrode placement. One channel was dedicated to EKG recording.    Background: The background consists of generalized irregular slow activity with occasional superimposed sleep spindles. There was no PDR seen.   Photic stimulation: Physiologic driving is none  EEG Abnormalities: 1) generalized irregular slow activity 2) Absent PDR  Clinical Interpretation: This EEG is consistent with a generalized non-specific cerebral dysfunction(encephalopathy). There was no seizure or seizure predisposition recorded on this study. Please note that a normal EEG does not preclude the possibility of epilepsy.   Ritta SlotMcNeill Zahir Eisenhour, MD [facility] [phone]  If 7pm- 7am, please page neurology on call as listed in AMION.

## 2017-07-20 NOTE — ED Notes (Signed)
Lactic over 2 and Sodium 112 noted to [facility] [facility] CCM.

## 2017-07-20 NOTE — Progress Notes (Signed)
I was called by RN regarding patient's most recent sodium level. Sodium on presentation today was 112 >>115 >>114. Clinically patient's status has not changed. Head CT negative. EEG showed no seizures. Patient is hemodynamically stable. She has received a total of 2L NS bolus and is on NS gtt @ 75cc/hr. However at the time of the most recent Na level, she had only received 1L NS bolus. Therefore, will continue conservative management with NS gtt @ 75cc/hr. Continue checking Na q4hrs. If repeat Na is not improved, will add 3% Saline gtt, starting at a slow rate. [facility].    In following up on other labs, APAP level negative. Patient persistently febrile; will add APAP PRN for fever. Phos low. Will replete with NaPhos IV. Cdiff PCR negative; will d/c contact precautions.

## 2017-07-20 NOTE — Progress Notes (Signed)
eLink Physician-Brief Progress Note Patient Name: Hoy Mornshley Faiella DOB: August 22, 1976 MRN: [id number]   Date of Service  07/20/2017  HPI/Events of Note  Called to review CXR for gastric tube placement - Gastric tube side port at the level of the GE junction.   eICU Interventions  Will order: 1. Advance gastric tube another 8 cm. 2. Repeat KUB for gastric tube placement.      Intervention Category Intermediate Interventions: Diagnostic test evaluation  Lenell AntuSommer,Christ Fullenwider Eugene 07/20/2017, 8:51 PM

## 2017-07-20 NOTE — H&P (Signed)
PULMONARY / CRITICAL CARE MEDICINE   Name: Julie Conley MRN: [id number] DOB: 12-Jan-1976    ADMISSION DATE:  07/20/2017 CONSULTATION DATE: 11/13  REFERRING MD:  haviland  CHIEF COMPLAINT: Acute encephalopathy and effective airway protection  HISTORY OF PRESENT ILLNESS:    This is a 41 year old female patient with a significant medical history of depression, alcohol dependency,Review of medical records and anxiety.  Show she was just discharged from the hospital on 10/11 after being admitted on 10/8 for alcohol withdrawal.  She has been admitted for detox previously in the past.  Her last admission she voluntarily was admitted after she was trying to wean herself from alcohol.  She presents to the emergency room today on 11/13 after being found by family unresponsive.  Per EMS she had again been drinking.  Her daughter apparently had been checking in on her intermittently.  Called EMS the morning of 11/13 when the patient was found unresponsive in the bed, incontinent of urine feces.  [facility] room for further evaluation.  On arrival she was altered with ineffective breathing pattern and therefore was intubated for airway protection pulmonary critical care was asked to admit.   PAST MEDICAL HISTORY :  She  has a past medical history of Alcohol abuse, Anxiety, Bimalleolar fracture of left ankle (04/2015), Complication of anesthesia, Depression, GERD (gastroesophageal reflux disease), Headache, Hypothyroidism (2010-2012), Iron deficiency anemia, Migraines, Pneumonia ("several times"), and Sciatic pain (04/12/2015).  PAST SURGICAL HISTORY: She  has a past surgical history that includes Cesarean section (1996; 2000); Tonsillectomy (~ 1981); Roux-en-Y Gastric Bypass (2009); Abdominoplasty (2015); laparotomy (2011); Vaginal hysterectomy (2013); Cystoscopy with hydrodistension and biopsy (2010); Endometrial ablation (2010); Dilation and curettage of uterus (2010);  Laparoscopic cholecystectomy (2006); Appendectomy (~ 1986); Fracture surgery; Augmentation mammaplasty (2015); Laparoscopic tubal ligation (2001); Nasal septoplasty w/ turbinoplasty (1991; 2008); and OPEN REDUCTION INTERNAL FIXATION (ORIF) LEFT BIMALLEOLAR AND SYNDESMOSIS ANKLE FRACTURE (Left, 04/15/2015).  Allergies  Allergen Reactions  . Adhesive [Tape] Other (See Comments)    TEARS SKIN  . Ambien [Zolpidem Tartrate] Other (See Comments)    Delirium, disorientation  . Sulfa Antibiotics Itching    No current facility-administered medications on file prior to encounter.    Current Outpatient Medications on File Prior to Encounter  Medication Sig  . famotidine (PEPCID) 20 MG tablet Take 1 tablet (20 mg total) by mouth 2 (two) times daily. (Patient not taking: Reported on 11/05/2016)  . ferrous sulfate 325 (65 FE) MG EC tablet Take 1 tablet (325 mg total) by mouth 3 (three) times daily with meals. (Patient not taking: Reported on 07/04/2016)  . folic acid (FOLVITE) 1 MG tablet Take 1 tablet (1 mg total) by mouth daily.  . metoprolol tartrate (LOPRESSOR) 25 MG tablet Take 0.5 tablets (12.5 mg total) by mouth 2 (two) times daily.  . Multiple Vitamin (MULTIVITAMIN) capsule Take 1 capsule by mouth daily. (Patient not taking: Reported on 06/14/2017)  . pantoprazole (PROTONIX) 40 MG tablet Take 1 tablet (40 mg total) by mouth daily.    FAMILY HISTORY:  Her indicated that the status of her mother is unknown. She indicated that the status of her father is unknown. She indicated that her brother is deceased.   SOCIAL HISTORY: She  reports that she has been smoking cigarettes.  She has a 0.12 pack-year smoking history. she has never used smokeless tobacco. She reports that she drinks alcohol. She reports that she does not use drugs.  REVIEW OF SYSTEMS:   Unable SUBJECTIVE:  Sedated on the ventilator  VITAL SIGNS: BP (!) 131/92   Pulse (!) 134   Resp (!) 29   Ht 5\' 7"  (1.702 m)   Wt 197 lb  (89.4 kg)   SpO2 97%   BMI 30.85 kg/m   HEMODYNAMICS:    VENTILATOR SETTINGS:    INTAKE / OUTPUT: No intake/output data recorded.  PHYSICAL EXAMINATION: General: 41 year old Caucasian female currently sedated following oral intubation.  She is now coughing against the ventilator, but not making any purposeful movement.  She  has been incontinent of both bowel and bladder, she has multiple abrasions over her body. Neuro: Positive cough and gag, otherwise no purposeful movement. HEENT: Normocephalic atraumatic orally intubated Cardiovascular: Tachycardic regular regular without murmur rub or gallop Lungs: Coarse diffuse scattered rhonchi Abdomen: Soft no organomegaly Musculoskeletal: Equal strength and bulk Skin: Warm, diaphoretic, strong pulses.  She has multiple abrasions with contact related erythemic rash over peritoneum and waistband both knees are erythemic.  And discolored  LABS:  BMET Recent Labs  Lab 07/20/17 1338  NA 112*  K 5.8*  CL 86*  BUN 40*  CREATININE 1.20*  GLUCOSE 188*    Electrolytes No results for input(s): CALCIUM, MG, PHOS in the last 168 hours.  CBC Recent Labs  Lab 07/20/17 1338  HGB 17.0*  HCT 50.0*    Coag's No results for input(s): APTT, INR in the last 168 hours.  Sepsis Markers Recent Labs  Lab 07/20/17 1338  LATICACIDVEN 2.84*    ABG No results for input(s): PHART, PCO2ART, PO2ART in the last 168 hours.  Liver Enzymes No results for input(s): AST, ALT, ALKPHOS, BILITOT, ALBUMIN in the last 168 hours.  Cardiac Enzymes No results for input(s): TROPONINI, PROBNP in the last 168 hours.  Glucose No results for input(s): GLUCAP in the last 168 hours.  Imaging No results found.   STUDIES:  CT head 11/13 EEG 11/13  CULTURES: Urine culture 11/13  sputum culture 11/13  blood culture 11/13  ANTIBIOTICS:   SIGNIFICANT EVENTS:   LINES/TUBES: Endotracheal tube 11/13  DISCUSSION: 41 year old female patient  with a known history of EtOH abuse recently discharged after an admission for detox.  Found unresponsive by family member for unknown length of time.  She was incontinent with multiple abrasions over her lower body as well as back suggesting immobility for some time.  She apparently had been drinking and EMS reported multiple alcohol bottles in the bed with her.  She was intubated by ED provider.  Initial lab work with severe hyponatremia, AK I, and hemoconcentration.  Awaiting CT head.  Suspect encephalopathy multifactorial, including: Acute intoxication, metabolic derangements from hyponatremia and acidosis, need to rule out seizure, also consider postictal state.  She is critically ill and will require ICU admission.  ASSESSMENT / PLAN: NEUROLOGIC A:   Acute encephalopathy.  Multifactorial likely.  Acute alcohol intoxication, multiple derangements including hyponatremia, acidosis, also would worry about possibility of postictal state or even nonconvulsive seizure History of alcoholism History of depression P:   RASS goal: Negative 2 CT head EEG Seizure precautions PAD protocol Correct metabolic derangements  RENAL A:   Hyponatremia (hypo-osmolar) ? Beer potomania?  AKI; concern for rhabdomyolysis Hyperkalemia  Mild elevated LA ->recieved 1 liter NS in ER P:   Ck urine Osmo, Na, UA, myoglobin, send serum osmo, total CKs NS at 50 ml/hr for now Ck ABG Repeating BMP; Serial chemistries Place f/c, strict I&O  PULMONARY A: Acute respiratory failure in setting of ineffective airway clearance  P:   Full vent support Follow-up ABG and chest x-ray PAD protocol RASS: Negative to  CARDIOVASCULAR A:  Tachycardia  P:  gentle hydration  Cycle cardiac enzymes Telemetry monitoring  GASTROINTESTINAL A:   Diarrhea  P:   flexiseal  Send C. Difficile  HEMATOLOGIC A:   Bloody gastric lavage Hemoconcentration History of iron deficiency anemia p:  Serial CBCs, anticipate drop in  H&H Holding off on low molecular weight heparin  INFECTIOUS A:   Aspiration risk P:   Sending urine, sputum, and blood cultures  ENDOCRINE A:   Hyperglycemia History of hypothyroidism, not on therapy P:   Checking TSH, checking cortisol Sliding scale insulin protocol  Dermatology A: Multiple abrasions over her back, peritoneum, and legs some bleeding. P: Wound ostomy consult     FAMILY  - Updates:   - Inter-disciplinary family meet or Palliative Care meeting due by: 11/20  Simonne MartinetPeter E Davionne Dowty ACNP-BC [facility] Pulmonary/Critical Care Pager # [phone] OR # [phone] if no answer   07/20/2017, 1:53 PM

## 2017-07-20 NOTE — Progress Notes (Signed)
EEG completed, results pending. 

## 2017-07-20 NOTE — ED Provider Notes (Signed)
[facility] MEDICAL ICU Provider Note   CSN: [id number] Arrival date & time: 07/20/17  1311     History   Chief Complaint Chief Complaint  Patient presents with  . Altered Mental Status    HPI Serra Younan is a 41 y.o. female presenting via EMS after being found unresponsive by daughter. Fecal and urine incontinence. Patient with history of ETOH abuse and found surrounded by alcohol bottle. Unknown down time. Some response to painful stimuli.   HPI  Past Medical History:  Diagnosis Date  . Alcohol abuse   . Anxiety   . Bimalleolar fracture of left ankle 04/2015  . Complication of anesthesia    hypotension with breast augmentation  . Depression   . GERD (gastroesophageal reflux disease)   . Headache    "once/week" (03/20/2016)  . Hypothyroidism 2010-2012   "stopped RX in 2012 when I didn't meet standards" (03/20/2016)  . Iron deficiency anemia   . Migraines    "once/month" (03/20/2016)  . Pneumonia "several times"  . Sciatic pain 04/12/2015   left    Patient Active Problem List   Diagnosis Date Noted  . Acute encephalopathy 07/20/2017  . Thrombocytopenia ([place]) 06/14/2017  . Alcohol withdrawal ([place]) 06/14/2017  . 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 Alcohol withdrawal syndrome without complication ([place]) 91/63/8466  . Sinus tachycardia 04/24/2016  . Normocytic anemia 04/24/2016  . Alcoholic ketoacidosis 59/93/5701  . Chest pain 03/20/2016  . Alcohol consumption binge drinking 03/20/2016  . Depression 03/20/2016  . Gastritis 03/20/2016  . 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 Elevated lipase   . Hypokalemia 01/20/2016  . Hyperglycemia 01/20/2016  . Hyponatremia 01/20/2016  . Alcohol dependence with withdrawal with complication ([place]) 77/93/9030  . 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 ABDOMINOPLASTY  2015  . APPENDECTOMY  ~ 1986  . AUGMENTATION MAMMAPLASTY  2015  . [place]; 2000  . CYSTOSCOPY WITH HYDRODISTENSION AND BIOPSY  2010  . DILATION AND CURETTAGE OF UTERUS  2010  . ENDOMETRIAL ABLATION  2010  . FRACTURE SURGERY    . LAPAROSCOPIC CHOLECYSTECTOMY  2006  . LAPAROSCOPIC TUBAL LIGATION  2001   w/banding  . LAPAROTOMY  2011  . NASAL SEPTOPLASTY W/ TURBINOPLASTY  1991; 2008  . ROUX-EN-Y GASTRIC BYPASS  2009  . TONSILLECTOMY  ~ 1981  . VAGINAL HYSTERECTOMY  2013   complete    OB History    No data available       Home Medications    Prior to Admission medications   Not on File    Family History Family History  Problem Relation Age of Onset  . Drug abuse Brother   . Alcohol abuse Father   . Drug abuse Mother     Social History Social History   Tobacco Use  . Smoking status: Current Every Day Smoker    Packs/day: 0.25    Years: 0.50    Pack years: 0.12    Types: Cigarettes  . Smokeless tobacco: Never Used  Substance Use Topics  . Alcohol use: Yes    Comment: Drinks 2-5ths per day and 2 big bottles of wine on Sundays   . Drug use: No     Allergies   Adhesive [tape]; Ambien [zolpidem tartrate]; Latex; and Sulfa antibiotics   Review of Systems Review of Systems  Unable to perform ROS: Patient unresponsive     Physical Exam Updated Vital Signs BP Marland Kitchen)  176/119   Pulse (!) 136   Temp 98.2 F (36.8 C) (Oral)   Resp (!) 29   Ht '5\' 7"'  (1.702 m)   Wt 89.4 kg (197 lb)   SpO2 95%   BMI 30.85 kg/m   Physical Exam  Cardiovascular: Regular rhythm, normal heart sounds and intact distal pulses.  Pulmonary/Chest: Breath sounds normal. No stridor. She has no wheezes. She has no rales.  Patient was not protecting her airway well  Abdominal: Soft.  Neurological: She is unresponsive. GCS eye subscore is 1. GCS verbal subscore is 1. GCS motor subscore is 4.  Skin: Skin is warm. There is erythema.  Erythema/ecchymosis to flanks and back  Nursing note and vitals reviewed.    ED Treatments / Results  Labs (all  labs ordered are listed, but only abnormal results are displayed) Labs Reviewed  COMPREHENSIVE METABOLIC PANEL - Abnormal; Notable for the following components:      Result Value   Sodium 112 (*)    Potassium 5.9 (*)    Chloride 82 (*)    CO2 16 (*)    Glucose, Bld 190 (*)    BUN 37 (*)    Creatinine, Ser 1.42 (*)    Calcium 8.3 (*)    Albumin 2.9 (*)    AST 49 (*)    Total Bilirubin 1.7 (*)    GFR calc non Af Amer 45 (*)    GFR calc Af Amer 52 (*)    All other components within normal limits  CBC WITH DIFFERENTIAL/PLATELET - Abnormal; Notable for the following components:   RBC 5.13 (*)    Hemoglobin 16.4 (*)    MCHC 37.6 (*)    Monocytes Absolute 1.6 (*)    All other components within normal limits  URINALYSIS, ROUTINE W REFLEX MICROSCOPIC - Abnormal; Notable for the following components:   Color, Urine AMBER (*)    APPearance HAZY (*)    Hgb urine dipstick SMALL (*)    Bilirubin Urine SMALL (*)    Ketones, ur 5 (*)    Protein, ur 100 (*)    Bacteria, UA RARE (*)    Squamous Epithelial / LPF 0-5 (*)    Non Squamous Epithelial 0-5 (*)    All other components within normal limits  CK - Abnormal; Notable for the following components:   Total CK 276 (*)    All other components within normal limits  MAGNESIUM - Abnormal; Notable for the following components:   Magnesium 2.7 (*)    All other components within normal limits  GLUCOSE, CAPILLARY - Abnormal; Notable for the following components:   Glucose-Capillary 161 (*)    All other components within normal limits  I-STAT ARTERIAL BLOOD GAS, ED - Abnormal; Notable for the following components:   pH, Arterial 7.490 (*)    pCO2 arterial 20.9 (*)    pO2, Arterial 399.0 (*)    Bicarbonate 15.8 (*)    TCO2 16 (*)    Acid-base deficit 4.0 (*)    All other components within normal limits  I-STAT CG4 LACTIC ACID, ED - Abnormal; Notable for the following components:   Lactic Acid, Venous 2.84 (*)    All other components within  normal limits  I-STAT CHEM 8, ED - Abnormal; Notable for the following components:   Sodium 112 (*)    Potassium 5.8 (*)    Chloride 86 (*)    BUN 40 (*)    Creatinine, Ser 1.20 (*)    Glucose, Bld  188 (*)    Calcium, Ion 0.98 (*)    TCO2 18 (*)    Hemoglobin 17.0 (*)    HCT 50.0 (*)    All other components within normal limits  CULTURE, BLOOD (ROUTINE X 2)  CULTURE, BLOOD (ROUTINE X 2)  URINE CULTURE  CULTURE, RESPIRATORY (NON-EXPECTORATED)  C DIFFICILE QUICK SCREEN W PCR REFLEX  MRSA PCR SCREENING  RAPID URINE DRUG SCREEN, HOSP PERFORMED  PROTIME-INR  OSMOLALITY, URINE  SODIUM, URINE, RANDOM  ETHANOL  OSMOLALITY  TSH  CORTISOL  TROPONIN I  TROPONIN I  TROPONIN I  LIPASE, BLOOD  MAGNESIUM  PHOSPHORUS  COMPREHENSIVE METABOLIC PANEL  PROCALCITONIN  PROTIME-INR  APTT  TRIGLYCERIDES  CK TOTAL AND CKMB (NOT AT [facility])  MYOGLOBIN, URINE  ACETAMINOPHEN LEVEL  SALICYLATE LEVEL  BLOOD GAS, ARTERIAL  URINE DRUGS OF ABUSE SCREEN W ALC, ROUTINE (REF LAB)  BASIC METABOLIC PANEL  BASIC METABOLIC PANEL  BASIC METABOLIC PANEL  LACTIC ACID, PLASMA  LACTIC ACID, PLASMA  BASIC METABOLIC PANEL  I-STAT BETA HCG BLOOD, ED ([facility], [facility], [facility] ONLY)    EKG  EKG Interpretation  Date/Time:  Tuesday July 20 2017 13:16:10 EST Ventricular Rate:  133 PR Interval:    QRS Duration: 83 QT Interval:  278 QTC Calculation: 414 R Axis:   -52 Text Interpretation:  Sinus tachycardia Left anterior fascicular block Borderline low voltage, extremity leads Anteroseptal infarct, old ST elevation suggests acute pericarditis Baseline wander in lead(s) V1 V3 Confirmed by Isla Pence [phone]) on 07/20/2017 1:53:41 PM       Radiology Ct Head Wo Contrast  Result Date: 07/20/2017 CLINICAL DATA:  Altered mental status. EXAM: CT HEAD WITHOUT CONTRAST TECHNIQUE: Contiguous axial images were obtained from the base of the skull through the vertex without intravenous contrast. COMPARISON:  CT head  report dated February 27, 2013. FINDINGS: Brain: No evidence of acute infarction, hemorrhage, hydrocephalus, extra-axial collection or mass lesion/mass effect. Prominent perivascular space in the right inferior basal ganglia. Vascular: No hyperdense vessel or unexpected calcification. Skull: Normal. Negative for fracture or focal lesion. Sinuses/Orbits: No acute finding. Other: Partially visualized endotracheal and enteric tubes in the oropharynx. IMPRESSION: 1.  No acute intracranial abnormality. Electronically Signed   By: Titus Dubin M.D.   On: 07/20/2017 15:55   Dg Chest Port 1 View  Result Date: 07/20/2017 CLINICAL DATA:  Check endotracheal tube placement EXAM: PORTABLE CHEST 1 VIEW COMPARISON:  None. FINDINGS: Cardiac shadow is within normal limits. Endotracheal tube is noted in satisfactory position. Nasogastric catheter is noted within the stomach. The lungs are clear bilaterally. No acute bony abnormality is seen. IMPRESSION: No acute abnormality noted. Tubes and lines as described. Electronically Signed   By: Inez Catalina M.D.   On: 07/20/2017 14:38    Procedures Procedure Name: Intubation Date/Time: 07/20/2017 1:37 PM Performed by: Emeline General, PA-C Pre-anesthesia Checklist: Patient identified, Timeout performed, Emergency Drugs available, Suction available and Patient being monitored Oxygen Delivery Method: Ambu bag Preoxygenation: Pre-oxygenation with 100% oxygen Induction Type: Rapid sequence Ventilation: Mask ventilation without difficulty Laryngoscope Size: Glidescope Grade View: Grade I Tube size: 7.5 mm Number of attempts: 1 Airway Equipment and Method: Stylet,  Video-laryngoscopy and Oral airway Placement Confirmation: ETT inserted through vocal cords under direct vision and CO2 detector Tube secured with: ETT holder Difficulty Due To: Difficult Airway- due to limited oral opening     (including critical care time)  CRITICAL CARE Performed by: Emeline General   Total critical care time: 30  minutes  Critical care time was exclusive of separately billable procedures and treating other patients.  Critical care was necessary to treat or prevent imminent or life-threatening deterioration.  Critical care was time spent personally by me on the following activities: development of treatment plan with patient and/or surrogate as well as nursing, discussions with consultants, evaluation of patient's response to treatment, examination of patient, obtaining history from patient or surrogate, ordering and performing treatments and interventions, ordering and review of laboratory studies, ordering and review of radiographic studies, pulse oximetry and re-evaluation of patient's condition.  Medications Ordered in ED Medications  0.9 %  sodium chloride infusion (not administered)  0.9 %  sodium chloride infusion ( Intravenous New Bag/Given 07/20/17 1520)  fentaNYL (SUBLIMAZE) injection 100 mcg (not administered)  fentaNYL (SUBLIMAZE) injection 100 mcg (not administered)  propofol (DIPRIVAN) 1000 MG/100ML infusion (15 mcg/kg/min  89.4 kg Intravenous New Bag/Given 07/20/17 1338)  midazolam (VERSED) injection 1-2 mg (not administered)  folic acid (FOLVITE) tablet 1 mg (not administered)  thiamine (VITAMIN B-1) tablet 100 mg (not administered)  multivitamin with minerals tablet 1 tablet (not administered)  chlorhexidine gluconate (MEDLINE KIT) (PERIDEX) 0.12 % solution 15 mL (not administered)  MEDLINE mouth rinse (not administered)  pantoprazole (PROTONIX) injection 40 mg (not administered)  vancomycin (VANCOCIN) 1,750 mg in sodium chloride 0.9 % 500 mL IVPB (not administered)  piperacillin-tazobactam (ZOSYN) IVPB 3.375 g (not administered)  vancomycin (VANCOCIN) IVPB 1000 mg/200 mL premix (not administered)  piperacillin-tazobactam (ZOSYN) IVPB 3.375 g (not administered)  sodium chloride 0.9 % bolus 1,000 mL (not administered)  sodium chloride 0.9 %  bolus 1,000 mL (not administered)  sodium chloride 0.9 % bolus 1,000 mL (0 mLs Intravenous Stopped 07/20/17 1454)     Initial Impression / Assessment and Plan / ED Course  I have reviewed the triage vital signs and the nursing notes.  Pertinent labs & imaging results that were available during my care of the patient were reviewed by me and considered in my medical decision making (see chart for details).     Patient presented via EMS unresponsive GCS 6. Responsive to painful stimulus. Breathing on her own with difficulty protecting her airway. Distal pulses intact.  She was intubated using RSI  She was found unresponsive with unknown down time. IV access obtained and labs drawn. Given IV fluids  Critical care was already in ED seeing another patient and patient had prompt transfer of care and report to Salvadore Dom. Patient was admitted to critical care. Discussed with Salvadore Dom, NP (admitting physician Dr. Phylliss Bob).   Final Clinical Impressions(s) / ED Diagnoses   Final diagnoses:  Altered mental status, unspecified altered mental status type  Hyponatremia  Hyperkalemia    ED Discharge Orders    None       Dossie Der 07/20/17 1649    Isla Pence, MD 07/20/17 [phone]

## 2017-07-20 NOTE — ED Notes (Signed)
Etomidate 20mg  given IV by Clydie BraunKaren, RN. Followed by Ovidio HangerSucch 100mg  1319

## 2017-07-20 NOTE — Progress Notes (Signed)
Pharmacy Antibiotic Note  Julie Conley is a 41 y.o. female admitted on 07/20/2017 with sepsis.  Pharmacy has been consulted for Vanc/Zosyn dosing.  Febrile 102degF, Pulse Rate 136, Resp 28, WBC 8.9, LA 2.84  Plan: Vancomycin 1750mg  IV X1 Then 1000mg   IV every 12 hours.  Goal trough 15-20 mcg/mL. Zosyn 3.375g IV X 1 30min infusion then 3.375g q8h (4 hour infusion).  F/U renal function, clinical status, C&S, vanc trough at SS   Height: 5\' 7"  (170.2 cm) Weight: 197 lb (89.4 kg) IBW/kg (Calculated) : 61.6  Temp (24hrs), Avg:101.6 F (38.7 C), Min:100.2 F (37.9 C), Max:102.2 F (39 C)  Recent Labs  Lab 07/20/17 1330 07/20/17 1338  WBC 8.9  --   CREATININE 1.42* 1.20*  LATICACIDVEN  --  2.84*    Estimated Creatinine Clearance: 70.8 mL/min (A) (by C-G formula based on SCr of 1.2 mg/dL (H)).    Allergies  Allergen Reactions  . Adhesive [Tape] Other (See Comments)    TEARS SKIN  . Ambien [Zolpidem Tartrate] Other (See Comments)    Delirium, disorientation  . Latex Rash  . Sulfa Antibiotics Itching    Antimicrobials this admission: Vanc 11/13 >> Zosyn 11/13 >>   Dose adjustments this admission: N/A  Microbiology results:   Thank you for allowing pharmacy to be a part of this patient's care  Karna DupesMedina Julie Conley  PharmD Candidate '19 07/12/2017 3:30 PM

## 2017-07-20 NOTE — ED Notes (Signed)
Propofol dripp started with vo after pt movement n oted.

## 2017-07-20 NOTE — Progress Notes (Signed)
Pt transported on vent from ED to CT and then to 2M03.  Report given to ICU RT.  Pt's vitals remained stable throughout the trip.

## 2017-07-21 ENCOUNTER — Inpatient Hospital Stay (HOSPITAL_COMMUNITY): Payer: Self-pay

## 2017-07-21 DIAGNOSIS — L8990 Pressure ulcer of unspecified site, unspecified stage: Secondary | ICD-10-CM

## 2017-07-21 DIAGNOSIS — G9340 Encephalopathy, unspecified: Secondary | ICD-10-CM

## 2017-07-21 DIAGNOSIS — R600 Localized edema: Secondary | ICD-10-CM

## 2017-07-21 LAB — BASIC METABOLIC PANEL
ANION GAP: 15 (ref 5–15)
Anion gap: 11 (ref 5–15)
Anion gap: 14 (ref 5–15)
Anion gap: 13 (ref 5–15)
BUN: 24 mg/dL — AB (ref 6–20)
BUN: 17 mg/dL (ref 6–20)
BUN: 16 mg/dL (ref 6–20)
BUN: 12 mg/dL (ref 6–20)
CALCIUM: 7.4 mg/dL — AB (ref 8.9–10.3)
CALCIUM: 7.9 mg/dL — AB (ref 8.9–10.3)
CALCIUM: 7.9 mg/dL — AB (ref 8.9–10.3)
CALCIUM: 7.3 mg/dL — AB (ref 8.9–10.3)
CHLORIDE: 95 mmol/L — AB (ref 101–111)
CO2: 14 mmol/L — AB (ref 22–32)
CO2: 14 mmol/L — ABNORMAL LOW (ref 22–32)
CO2: 15 mmol/L — ABNORMAL LOW (ref 22–32)
CO2: 15 mmol/L — ABNORMAL LOW (ref 22–32)
CREATININE: 0.84 mg/dL (ref 0.44–1.00)
CREATININE: 0.72 mg/dL (ref 0.44–1.00)
CREATININE: 0.67 mg/dL (ref 0.44–1.00)
Chloride: 92 mmol/L — ABNORMAL LOW (ref 101–111)
Chloride: 93 mmol/L — ABNORMAL LOW (ref 101–111)
Chloride: 93 mmol/L — ABNORMAL LOW (ref 101–111)
Creatinine, Ser: 0.73 mg/dL (ref 0.44–1.00)
GFR calc Af Amer: >60 mL/min (ref >60)
GFR calc Af Amer: >60 mL/min (ref >60)
GFR calc Af Amer: >60 mL/min (ref >60)
GFR calc Af Amer: >60 mL/min (ref >60)
GFR calc non Af Amer: >60 mL/min (ref >60)
GLUCOSE: 126 mg/dL — AB (ref 65–99)
GLUCOSE: 113 mg/dL — AB (ref 65–99)
GLUCOSE: 125 mg/dL — AB (ref 65–99)
Glucose, Bld: 139 mg/dL — ABNORMAL HIGH (ref 65–99)
POTASSIUM: 3.9 mmol/L (ref 3.5–5.1)
POTASSIUM: 4.7 mmol/L (ref 3.5–5.1)
Potassium: 4.1 mmol/L (ref 3.5–5.1)
Potassium: 3.3 mmol/L — ABNORMAL LOW (ref 3.5–5.1)
SODIUM: 121 mmol/L — AB (ref 135–145)
SODIUM: 122 mmol/L — AB (ref 135–145)
SODIUM: 121 mmol/L — AB (ref 135–145)
Sodium: 120 mmol/L — ABNORMAL LOW (ref 135–145)

## 2017-07-21 LAB — URINE CULTURE: Culture: NO GROWTH

## 2017-07-21 LAB — BLOOD GAS, ARTERIAL
Acid-base deficit: 9.8 mmol/L — ABNORMAL HIGH (ref 0.0–2.0)
Bicarbonate: 13.1 mmol/L — ABNORMAL LOW (ref 20.0–28.0)
DRAWN BY: 252031
FIO2: 40
O2 Saturation: 98.7 %
PEEP: 5 cmH2O
PH ART: 7.471 — AB (ref 7.350–7.450)
Patient temperature: 99.9
RATE: 15 resp/min
VT: 490 mL
pO2, Arterial: 159 mmHg — ABNORMAL HIGH (ref 83.0–108.0)

## 2017-07-21 LAB — PHOSPHORUS: PHOSPHORUS: 2.2 mg/dL — AB (ref 2.5–4.6)

## 2017-07-21 LAB — CBC
HCT: 36.9 % (ref 36.0–46.0)
Hemoglobin: 13.7 g/dL (ref 12.0–15.0)
MCH: 32.2 pg (ref 26.0–34.0)
MCHC: 37.1 g/dL — AB (ref 30.0–36.0)
MCV: 86.6 fL (ref 78.0–100.0)
PLATELETS: 131 10*3/uL — AB (ref 150–400)
RBC: 4.26 MIL/uL (ref 3.87–5.11)
RDW: 15.8 % — ABNORMAL HIGH (ref 11.5–15.5)
WBC: 7.5 10*3/uL (ref 4.0–10.5)

## 2017-07-21 LAB — URINE DRUGS OF ABUSE SCREEN W ALC, ROUTINE (REF LAB)
Amphetamines, Urine: NEGATIVE ng/mL
BENZODIAZEPINE QUANT UR: NEGATIVE ng/mL
Barbiturate, Ur: NEGATIVE ng/mL
CANNABINOID QUANT UR: NEGATIVE ng/mL
Cocaine (Metab.): NEGATIVE ng/mL
Ethanol U, Quan: NEGATIVE %
METHADONE SCREEN, URINE: NEGATIVE ng/mL
OPIATE QUANT UR: NEGATIVE ng/mL
PHENCYCLIDINE, UR: NEGATIVE ng/mL
Propoxyphene, Urine: NEGATIVE ng/mL

## 2017-07-21 LAB — GLUCOSE, CAPILLARY: Glucose-Capillary: 151 mg/dL — ABNORMAL HIGH (ref 65–99)

## 2017-07-21 LAB — MYOGLOBIN, URINE: Myoglobin, Ur: 25 ng/mL — ABNORMAL HIGH (ref 0–13)

## 2017-07-21 LAB — TROPONIN I: Troponin I: <0.03 ng/mL (ref <0.03)

## 2017-07-21 LAB — TRIGLYCERIDES: Triglycerides: 621 mg/dL — ABNORMAL HIGH (ref <150)

## 2017-07-21 LAB — MAGNESIUM: MAGNESIUM: 2.3 mg/dL (ref 1.7–2.4)

## 2017-07-21 MED ORDER — LEVOTHYROXINE SODIUM 100 MCG IV SOLR
25.0000 ug | Freq: Every day | INTRAVENOUS | Status: DC
  Administered 2017-07-21 – 2017-07-22 (×2): 25 ug via INTRAVENOUS
  Filled 2017-07-21 (×3): qty 5

## 2017-07-21 MED ORDER — LORAZEPAM 2 MG/ML IJ SOLN
1.0000 mg | INTRAMUSCULAR | Status: DC | PRN
  Administered 2017-07-22 (×2): 1 mg via INTRAVENOUS
  Filled 2017-07-21 (×2): qty 1

## 2017-07-21 MED ORDER — PROMETHAZINE HCL 25 MG/ML IJ SOLN
12.5000 mg | Freq: Four times a day (QID) | INTRAMUSCULAR | Status: DC | PRN
  Administered 2017-07-21: 25 mg via INTRAVENOUS
  Filled 2017-07-21: qty 1

## 2017-07-21 MED ORDER — THIAMINE HCL 100 MG/ML IJ SOLN
100.0000 mg | Freq: Every day | INTRAMUSCULAR | Status: DC
  Administered 2017-07-21 – 2017-07-22 (×2): 100 mg via INTRAVENOUS
  Filled 2017-07-21: qty 2
  Filled 2017-07-21 (×2): qty 1

## 2017-07-21 MED ORDER — ONDANSETRON HCL 4 MG/2ML IJ SOLN
4.0000 mg | Freq: Four times a day (QID) | INTRAMUSCULAR | Status: DC | PRN
  Administered 2017-07-21 (×2): 4 mg via INTRAVENOUS
  Filled 2017-07-21 (×2): qty 2

## 2017-07-21 MED ORDER — FENTANYL CITRATE (PF) 100 MCG/2ML IJ SOLN
25.0000 ug | INTRAMUSCULAR | Status: DC | PRN
  Administered 2017-07-22: 25 ug via INTRAVENOUS
  Filled 2017-07-21: qty 2

## 2017-07-21 MED ORDER — SODIUM CHLORIDE 0.9 % IV SOLN
0.4000 ug/kg/h | INTRAVENOUS | Status: DC
  Administered 2017-07-21: 0.4 ug/kg/h via INTRAVENOUS
  Administered 2017-07-21: 0.8 ug/kg/h via INTRAVENOUS
  Filled 2017-07-21 (×3): qty 2

## 2017-07-21 MED ORDER — SODIUM CHLORIDE 0.9 % IV BOLUS (SEPSIS)
1000.0000 mL | Freq: Once | INTRAVENOUS | Status: AC
  Administered 2017-07-21: 1000 mL via INTRAVENOUS

## 2017-07-21 MED ORDER — POTASSIUM CHLORIDE 10 MEQ/100ML IV SOLN
10.0000 meq | INTRAVENOUS | Status: AC
  Administered 2017-07-21 – 2017-07-22 (×5): 10 meq via INTRAVENOUS
  Filled 2017-07-21 (×5): qty 100

## 2017-07-21 MED ORDER — VANCOMYCIN HCL IN DEXTROSE 1-5 GM/200ML-% IV SOLN
1000.0000 mg | Freq: Three times a day (TID) | INTRAVENOUS | Status: DC
  Administered 2017-07-21 – 2017-07-22 (×3): 1000 mg via INTRAVENOUS
  Filled 2017-07-21 (×4): qty 200

## 2017-07-21 MED ORDER — FOLIC ACID 5 MG/ML IJ SOLN
1.0000 mg | Freq: Every day | INTRAMUSCULAR | Status: DC
  Administered 2017-07-21 – 2017-07-22 (×2): 1 mg via INTRAVENOUS
  Filled 2017-07-21 (×4): qty 0.2

## 2017-07-21 MED ORDER — DEXMEDETOMIDINE HCL IN NACL 400 MCG/100ML IV SOLN
0.4000 ug/kg/h | INTRAVENOUS | Status: DC
  Administered 2017-07-21: 0.8 ug/kg/h via INTRAVENOUS
  Filled 2017-07-21 (×2): qty 100

## 2017-07-21 NOTE — Progress Notes (Signed)
PCCM Interval Progress Note  Asked to assess pt at bedside due to LLE warmth.  On exam, LLE much warmer to touch than RLE.  Some mild erythema but not convincing for cellulitis.  Pt is however on empiric abx for sepsis of unclear etiology.  No obvious cord palpable, unable to evaluate tenderness given that pt is intubated and sedated.  Will order LE duplex to r/o DVT.  Hold off on empiric heparin for now.   Rutherford Guysahul Starlett Pehrson, [place]PA - [facility] Pager: [phone]  or [phone] 07/21/2017, 3:21 AM

## 2017-07-21 NOTE — Progress Notes (Signed)
Bilateral lower extremity venous duplex has been completed. Negative for DVT.  07/21/17 3:48 PM Olen CordialGreg Cindy Fullman RVT

## 2017-07-21 NOTE — Progress Notes (Signed)
PULMONARY / CRITICAL CARE MEDICINE   Name: Julie Conley MRN: [id number] DOB: 1976-04-11    ADMISSION DATE:  07/20/2017  REFERRING MD:  Dr. Particia NearingHaviland  CHIEF COMPLAINT:  Altered mental status  HISTORY OF PRESENT ILLNESS:   41 yo female smoker with hx of ETOH presented to ER with altered mental status after starting to drink alcohol again.  She required intubation for airway protection. PMHx of depression, HA, hypothyroidism, GERD, s/p roux en y gastric bypass, iron deficiency anemia, sciatica.  SUBJECTIVE:  Remains sedated.  Tolerating pressure support.  VITAL SIGNS: BP (!) 153/95   Pulse 75   Temp 98.6 F (37 C)   Resp 19   Ht 5\' 7"  (1.702 m)   Wt 201 lb 4.5 oz (91.3 kg)   SpO2 (!) 88%   BMI 31.52 kg/m   VENTILATOR SETTINGS: Vent Mode: PRVC FiO2 (%):  [40 %-60 %] 40 % Set Rate:  [15 bmp] 15 bmp Vt Set:  [490 mL] 490 mL PEEP:  [5 cmH20] 5 cmH20 Plateau Pressure:  [12 cmH20-17 cmH20] 12 cmH20  INTAKE / OUTPUT: I/O last 3 completed shifts: In: 2315.7 [I.V.:1275.7; Other:40; IV Piggyback:1000] Out: 515 [Urine:515]  PHYSICAL EXAMINATION:  General - sedated Eyes - pupils reactive ENT - ETT in place Cardiac - regular, no murmur Chest - scattered rhonchi Abd - soft, non tender Ext - no edema Skin - no rashes Neuro - RASS -3  LABS:  BMET Recent Labs  Lab 07/21/17 0114 07/21/17 0800 07/21/17 1146  NA 120* 121* 122*  K 4.1 3.9 4.7  CL 95* 92* 93*  CO2 14* 14* 15*  BUN 24* 17 16  CREATININE 0.84 0.73 0.72  GLUCOSE 139* 126* 113*    Electrolytes Recent Labs  Lab 07/20/17 1330 07/20/17 1627  07/21/17 0114 07/21/17 0522 07/21/17 0800 07/21/17 1146  CALCIUM 8.3* 7.8*   < > 7.4*  --  7.9* 7.9*  MG 2.7* 2.4  --   --  2.3  --   --   PHOS  --  2.0*  --   --  2.2*  --   --    < > = values in this interval not displayed.    CBC Recent Labs  Lab 07/20/17 1330 07/20/17 1338 07/21/17 0522  WBC 8.9  --  7.5  HGB 16.4* 17.0* 13.7  HCT 43.6 50.0*  36.9  PLT 178  --  131*    Coag's Recent Labs  Lab 07/20/17 1330 07/20/17 1627  APTT  --  28  INR 0.98 1.06    Sepsis Markers Recent Labs  Lab 07/20/17 1338 07/20/17 1627 07/20/17 2129  LATICACIDVEN 2.84* 2.4* 2.7*  PROCALCITON  --  2.30  --     ABG Recent Labs  Lab 07/20/17 1433 07/21/17 0418  PHART 7.490* 7.471*  PCO2ART 20.9* VALUE BELOW REPORTABLE RANGE  PO2ART 399.0* 159*    Liver Enzymes Recent Labs  Lab 07/20/17 1330 07/20/17 1627  AST 49* 41  ALT 31 26  ALKPHOS 100 86  BILITOT 1.7* 1.4*  ALBUMIN 2.9* 2.5*    Cardiac Enzymes Recent Labs  Lab 07/20/17 1627 07/20/17 2129 07/21/17 0108  TROPONINI <0.03 <0.03 <0.03    Glucose Recent Labs  Lab 07/20/17 1603  GLUCAP 161*    Imaging Ct Head Wo Contrast  Result Date: 07/20/2017 CLINICAL DATA:  Altered mental status. EXAM: CT HEAD WITHOUT CONTRAST TECHNIQUE: Contiguous axial images were obtained from the base of the skull through the vertex without intravenous contrast. COMPARISON:  CT head report dated February 27, 2013. FINDINGS: Brain: No evidence of acute infarction, hemorrhage, hydrocephalus, extra-axial collection or mass lesion/mass effect. Prominent perivascular space in the right inferior basal ganglia. Vascular: No hyperdense vessel or unexpected calcification. Skull: Normal. Negative for fracture or focal lesion. Sinuses/Orbits: No acute finding. Other: Partially visualized endotracheal and enteric tubes in the oropharynx. IMPRESSION: 1.  No acute intracranial abnormality. Electronically Signed   By: Obie DredgeWilliam T Derry M.D.   On: 07/20/2017 15:55   Dg Chest Port 1 View  Result Date: 07/20/2017 CLINICAL DATA:  Check endotracheal tube placement EXAM: PORTABLE CHEST 1 VIEW COMPARISON:  None. FINDINGS: Cardiac shadow is within normal limits. Endotracheal tube is noted in satisfactory position. Nasogastric catheter is noted within the stomach. The lungs are clear bilaterally. No acute bony  abnormality is seen. IMPRESSION: No acute abnormality noted. Tubes and lines as described. Electronically Signed   By: Alcide CleverMark  Lukens M.D.   On: 07/20/2017 14:38   Dg Abd Portable 1v  Result Date: 07/20/2017 CLINICAL DATA:  OG tube placement EXAM: PORTABLE ABDOMEN - 1 VIEW COMPARISON:  07/20/2017 FINDINGS: Esophageal tube tip and side-port project over the proximal stomach. Surgical clips in the right and left upper quadrants. Postsurgical suture in the left hemiabdomen. Nonspecific decreased small bowel gas. IMPRESSION: Enteric tube tip projects over the proximal stomach Electronically Signed   By: Jasmine PangKim  Fujinaga M.D.   On: 07/20/2017 22:03   Dg Abd Portable 1v  Result Date: 07/20/2017 CLINICAL DATA:  Complaint associated with the gastric tube. EXAM: PORTABLE ABDOMEN - 1 VIEW COMPARISON:  03/20/16 FINDINGS: The enteric tube is identified with tip in the proximal stomach. The side port may be above the level of the GE junction. There is mild gaseous distension of the colon which, in the postoperative time frame may represent colonic ileus. IMPRESSION: 1. Tip of the enteric tube projects over the left upper quadrant of the abdomen in the expected location of the proximal stomach. The side port for the enteric tube may be at or just above the level of the GE junction. 2. Findings compatible with mild colonic ileus. Electronically Signed   By: Signa Kellaylor  Stroud M.D.   On: 07/20/2017 20:11     STUDIES:  CT head 11/13 >> no acute abnormality EEG 11/13 >> generalized irregular slowing  CULTURES: Urine 11/13 >> Sputum 11/13 >> Blood 11/13 >>  ANTIBIOTICS: Vancomycin11/13 >> Zosyn 11/13 >>  SIGNIFICANT EVENTS: 11/13 Admit  LINES/TUBES: ETT 11/13 >>   DISCUSSION: 41 yo with altered mental status and VDRF from alcohol intoxication, and hyponatremia.  ASSESSMENT / PLAN:  Acute respiratory failure with compromised airway. - pressure support wean >> if mental status improved, then likely can  extubate  Acute metabolic encephalopathy 2nd to hyponatremia. Alcohol intoxication with hx of DTs. - change to precedex in setting of elevated triglycerides  Hyponatremia. - continue normal saline IV fluids - f/u BMET q4h  Hypothyroidism. - synthroid  Fever with possible aspiration pneumonia. - continue unasyn  DVT prophylaxis - SCDs SUP - protonix Nutrition - NPO Goals of care - full code  CC time 33 minutes  Coralyn HellingVineet Amillion Macchia, MD [facility] 07/21/2017, 2:30 PM Pager:  [phone] After 3pm call: [phone]

## 2017-07-21 NOTE — Consult Note (Signed)
WOC Nurse wound follow up Wound type: MASD SEVERE buttocks/perineum  Stage 2 Pressure Injury; lower abdomen Deep tissue injury left elbow, intact skin Measurement: Affected area over the buttocks, extends to the posterior thighs, mons, lower back. Currently bleeding and weeping. Left elbow: 3cm x 3cm x 0cm Wound bed: Abdomen: partial thickness skin loss;linear Left elbow: 100% dark purple  Drainage (amount, consistency, odor) bleeding noted from the buttocks. No drainage from the elbow or abdomen Periwound: intact  Dressing procedure/placement/frequency: Gerhardt's to the buttocks Foam to the abdomen and elbow Add Prevalon boots bilaterally for vunerable heels. On SPORT mattress for moisture management and pressure redistribution.   WOC Nurse team will follow along with you for weekly wound assessments.  Please notify me of any acute changes in the wounds or any new areas of concerns [facility] MSN, RN,CWOCN, CNS, CWON-AP [phone]

## 2017-07-21 NOTE — Progress Notes (Signed)
eLink Physician-Brief Progress Note Patient Name: Hoy Mornshley Crammer DOB: Jan 28, 1976 MRN: [id number]   Date of Service  07/21/2017  HPI/Events of Note  Multiple issues: 1. Fever to 101.3 F and 2. L leg Reported to be red and swollen. Fever not breaking with Tylenol PRN and ice packs.   eICU Interventions  Will order: 1. Cooling Blanket. 2. Will ask ground team to evaluate leg at bedside.      Intervention Category Major Interventions: Infection - evaluation and management  Shamaria Kavan Eugene 07/21/2017, 3:01 AM

## 2017-07-21 NOTE — Procedures (Signed)
Extubation Procedure Note  Patient Details:   Name: Julie Conley DOB: October 12, 1975 MRN: [id number]   Airway Documentation:     Evaluation  O2 sats: stable throughout Complications: No apparent complications Patient did tolerate procedure well. Bilateral Breath Sounds: Diminished   Yes   Pt extubated to 3L [place] per MD order. Pt stable throughout with no complications. Pt able to speak and has a weak non-productive cough post extubation. VS within normal limits. Pt encouraged to use [place] to clear secretions. RT will continue to monitor.   Carolan ShiverKelley, Ketrick Matney M 07/21/2017, 3:58 PM

## 2017-07-21 NOTE — Progress Notes (Signed)
CRITICAL VALUE ALERT  Critical Value: Lactic Acid: 2.7 & Sodium: 117  Date & Time Notied: 2330 07-20-17  Provider Notified: Pola CornElink RN notified   Orders Received/Actions taken: Received

## 2017-07-21 NOTE — Progress Notes (Signed)
Pharmacy Antibiotic Note  Julie Conley is a 41 y.o. female admitted on 07/20/2017 with sepsis.  Pharmacy has been consulted for Vanc/Zosyn dosing.  Febrile in past 24 hours - treated with Tylenol and cooling blankets, now afebrile. WBC trending down,  LA 2.7, renal function improving, Scr 1.42>0.72.  Plan: Vancomycin 1000mg  IV q8h based on improved renal function. Goal trough 15-20 mcg/mL. Zosyn 3.375g IV q8h (4 hour infusion). Monitor clinic progress, renal function, electrolytes F/u BCx, C/S, vancomycin trough levels, future de-escalation, & length of therapy  Height: 5\' 7"  (170.2 cm) Weight: 201 lb 4.5 oz (91.3 kg) IBW/kg (Calculated) : 61.6  Temp (24hrs), Avg:101.2 F (38.4 C), Min:98.2 F (36.8 C), Max:102.2 F (39 C)  Recent Labs  Lab 07/20/17 1330 07/20/17 1338 07/20/17 1627 07/20/17 1648 07/20/17 2129 07/21/17 0114 07/21/17 0522 07/21/17 0800 07/21/17 1146  WBC 8.9  --   --   --   --   --  7.5  --   --   CREATININE 1.42* 1.20* 1.01* 0.99 0.92 0.84  --  0.73 0.72  LATICACIDVEN  --  2.84* 2.4*  --  2.7*  --   --   --   --     Estimated Creatinine Clearance: 107.4 mL/min (by C-G formula based on SCr of 0.73 mg/dL).    Allergies  Allergen Reactions  . Adhesive [Tape] Other (See Comments)    TEARS SKIN  . Ambien [Zolpidem Tartrate] Other (See Comments)    Delirium, disorientation  . Latex Rash  . Sulfa Antibiotics Itching    Antimicrobials this admission: Vanc 11/13 >> Zosyn 11/13 >>   Dose adjustments this admission: N/A  Microbiology results: 11/13 BCx: ngtd 11/13 MRSA PCR: neg 11/13 UCx: ngtd 11/13 Cdiff: neg  Thank you for allowing pharmacy to be a part of this patient's care  Donnella Biyler Dwanna Goshert, PharmD PGY1 Acute Care Pharmacy Resident Pager: [phone] 07/21/2017 1:39 PM

## 2017-07-22 ENCOUNTER — Inpatient Hospital Stay (HOSPITAL_COMMUNITY): Payer: Self-pay

## 2017-07-22 DIAGNOSIS — F10239 Alcohol dependence with withdrawal, unspecified: Secondary | ICD-10-CM

## 2017-07-22 DIAGNOSIS — F1010 Alcohol abuse, uncomplicated: Secondary | ICD-10-CM

## 2017-07-22 DIAGNOSIS — F419 Anxiety disorder, unspecified: Secondary | ICD-10-CM

## 2017-07-22 DIAGNOSIS — Z6379 Other stressful life events affecting family and household: Secondary | ICD-10-CM

## 2017-07-22 DIAGNOSIS — Z813 Family history of other psychoactive substance abuse and dependence: Secondary | ICD-10-CM

## 2017-07-22 DIAGNOSIS — F332 Major depressive disorder, recurrent severe without psychotic features: Secondary | ICD-10-CM

## 2017-07-22 DIAGNOSIS — Z811 Family history of alcohol abuse and dependence: Secondary | ICD-10-CM

## 2017-07-22 DIAGNOSIS — F1094 Alcohol use, unspecified with alcohol-induced mood disorder: Secondary | ICD-10-CM

## 2017-07-22 DIAGNOSIS — F17210 Nicotine dependence, cigarettes, uncomplicated: Secondary | ICD-10-CM

## 2017-07-22 DIAGNOSIS — E871 Hypo-osmolality and hyponatremia: Secondary | ICD-10-CM

## 2017-07-22 LAB — BASIC METABOLIC PANEL
Anion gap: 9 (ref 5–15)
BUN: 10 mg/dL (ref 6–20)
CALCIUM: 7.7 mg/dL — AB (ref 8.9–10.3)
CO2: 16 mmol/L — ABNORMAL LOW (ref 22–32)
CREATININE: 0.71 mg/dL (ref 0.44–1.00)
Chloride: 98 mmol/L — ABNORMAL LOW (ref 101–111)
GFR calc Af Amer: >60 mL/min (ref >60)
GLUCOSE: 110 mg/dL — AB (ref 65–99)
Potassium: 4.4 mmol/L (ref 3.5–5.1)
SODIUM: 123 mmol/L — AB (ref 135–145)

## 2017-07-22 LAB — CBC
HEMATOCRIT: 31.4 % — AB (ref 36.0–46.0)
Hemoglobin: 11.3 g/dL — ABNORMAL LOW (ref 12.0–15.0)
MCH: 31.2 pg (ref 26.0–34.0)
MCHC: 36 g/dL (ref 30.0–36.0)
MCV: 86.7 fL (ref 78.0–100.0)
Platelets: 128 10*3/uL — ABNORMAL LOW (ref 150–400)
RBC: 3.62 MIL/uL — ABNORMAL LOW (ref 3.87–5.11)
RDW: 15.6 % — AB (ref 11.5–15.5)
WBC: 9.4 10*3/uL (ref 4.0–10.5)

## 2017-07-22 MED ORDER — ORAL CARE MOUTH RINSE
15.0000 mL | Freq: Two times a day (BID) | OROMUCOSAL | Status: DC
  Administered 2017-07-22 – 2017-07-23 (×3): 15 mL via OROMUCOSAL

## 2017-07-22 MED ORDER — HYDROMORPHONE HCL 1 MG/ML IJ SOLN
1.0000 mg | INTRAMUSCULAR | Status: DC | PRN
  Administered 2017-07-22: 2 mg via INTRAVENOUS
  Administered 2017-07-23: 1 mg via INTRAVENOUS
  Administered 2017-07-23: 2 mg via INTRAVENOUS
  Filled 2017-07-22: qty 2
  Filled 2017-07-22: qty 1
  Filled 2017-07-22: qty 2

## 2017-07-22 MED ORDER — PROMETHAZINE HCL 25 MG/ML IJ SOLN: 12.5000 mg | Freq: Four times a day (QID) | INTRAMUSCULAR | Status: DC | PRN

## 2017-07-22 MED ORDER — ACETAMINOPHEN 160 MG/5ML PO SOLN
650.0000 mg | Freq: Four times a day (QID) | ORAL | Status: DC | PRN
  Administered 2017-07-24 – 2017-07-27 (×3): 650 mg via ORAL
  Filled 2017-07-22 (×3): qty 20.3

## 2017-07-22 MED ORDER — OXYCODONE-ACETAMINOPHEN 5-325 MG PO TABS
1.0000 | ORAL_TABLET | Freq: Three times a day (TID) | ORAL | Status: DC | PRN
  Administered 2017-07-22: 1 via ORAL
  Administered 2017-07-22: 2 via ORAL
  Administered 2017-07-23: 1 via ORAL
  Administered 2017-07-23: 2 via ORAL
  Administered 2017-07-23: 1 via ORAL
  Administered 2017-07-24: 2 via ORAL
  Filled 2017-07-22: qty 1
  Filled 2017-07-22: qty 2
  Filled 2017-07-22 (×2): qty 1
  Filled 2017-07-22: qty 2
  Filled 2017-07-22: qty 1
  Filled 2017-07-22: qty 2

## 2017-07-22 MED ORDER — ONDANSETRON HCL 4 MG/2ML IJ SOLN
4.0000 mg | Freq: Four times a day (QID) | INTRAMUSCULAR | Status: DC | PRN
  Administered 2017-07-26 – 2017-07-27 (×2): 4 mg via INTRAVENOUS
  Filled 2017-07-22 (×2): qty 2

## 2017-07-22 MED ORDER — METOPROLOL TARTRATE 25 MG PO TABS
25.0000 mg | ORAL_TABLET | Freq: Two times a day (BID) | ORAL | Status: DC
  Administered 2017-07-22 – 2017-07-27 (×10): 25 mg via ORAL
  Filled 2017-07-22 (×10): qty 1
  Filled 2017-07-22: qty 2

## 2017-07-22 MED ORDER — LORAZEPAM 2 MG/ML IJ SOLN
2.0000 mg | INTRAMUSCULAR | Status: DC | PRN
  Administered 2017-07-22 – 2017-07-23 (×3): 2 mg via INTRAVENOUS
  Filled 2017-07-22 (×3): qty 1

## 2017-07-22 NOTE — Progress Notes (Signed)
PULMONARY / CRITICAL CARE MEDICINE   Name: Julie Conley MRN: [id number] DOB: 16-Nov-1975    ADMISSION DATE:  07/20/2017  REFERRING MD:  Dr. 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  She required intubation for airway protection. PMHx of depression, HA, hypothyroidism, GERD, s/p roux en y gastric bypass, iron deficiency anemia, sciatica.  SUBJECTIVE:  Pt reports feeling anxious.  States she has been drinking since her husband died in 2014.  No acute events overnight.  Precedex turned off this am (0800)  VITAL SIGNS: BP 123/90   Pulse (!) 101   Temp 99.3 F (37.4 C)   Resp (!) 22   Ht 5\' 7"  (1.702 m)   Wt 201 lb 4.5 oz (91.3 kg)   SpO2 100%   BMI 31.52 kg/m   VENTILATOR SETTINGS: Vent Mode: CPAP;PSV FiO2 (%):  [40 %] 40 % Set Rate:  [15 bmp] 15 bmp Vt Set:  [490 mL] 490 mL PEEP:  [5 cmH20] 5 cmH20 Pressure Support:  [5 cmH20] 5 cmH20 Plateau Pressure:  [12 cmH20] 12 cmH20  INTAKE / OUTPUT: I/O last 3 completed shifts: In: 5007.7 [I.V.:3307.7; IV Piggyback:1700] Out: 1340 [Urine:1340]  PHYSICAL EXAMINATION: General: adult female in NAD HEENT: MM pink/moist, no jvd PSY: calm/appropriate  Neuro: AAOx4, speech clear  CV: s1s2 rrr, no m/r/g PULM: even/non-labored, lungs bilaterally clear  WU:JWJXGI:soft, non-tender, bsx4 active  Extremities: warm/dry, no edema  Skin: no rashes or lesions   LABS:  BMET Recent Labs  Lab 07/21/17 1146 07/21/17 1626 07/22/17 0308  NA 122* 121* 123*  K 4.7 3.3* 4.4  CL 93* 93* 98*  CO2 15* 15* 16*  BUN 16 12 10   CREATININE 0.72 0.67 0.71  GLUCOSE 113* 125* 110*    Electrolytes Recent Labs  Lab 07/20/17 1330 07/20/17 1627  07/21/17 0522  07/21/17 1146 07/21/17 1626 07/22/17 0308  CALCIUM 8.3* 7.8*   < >  --    < > 7.9* 7.3* 7.7*  MG 2.7* 2.4  --  2.3  --   --    --   --   PHOS  --  2.0*  --  2.2*  --   --   --   --    < > = values in this interval not displayed.    CBC Recent Labs  Lab 07/20/17 1330 07/20/17 1338 07/21/17 0522 07/22/17 0308  WBC 8.9  --  7.5 9.4  HGB 16.4* 17.0* 13.7 11.3*  HCT 43.6 50.0* 36.9 31.4*  PLT 178  --  131* 128*    Coag's Recent Labs  Lab 07/20/17 1330 07/20/17 1627  APTT  --  28  INR 0.98 1.06    Sepsis Markers Recent Labs  Lab 07/20/17 1338 07/20/17 1627 07/20/17 2129  LATICACIDVEN 2.84* 2.4* 2.7*  PROCALCITON  --  2.30  --     ABG Recent Labs  Lab 07/20/17 1433 07/21/17 0418  PHART 7.490* 7.471*  PCO2ART 20.9* VALUE BELOW REPORTABLE RANGE  PO2ART 399.0* 159*    Liver Enzymes Recent Labs  Lab 07/20/17 1330 07/20/17 1627  AST 49* 41  ALT 31 26  ALKPHOS 100 86  BILITOT 1.7* 1.4*  ALBUMIN 2.9* 2.5*    Cardiac Enzymes Recent Labs  Lab 07/20/17 1627 07/20/17 2129 07/21/17 0108  TROPONINI <0.03 <0.03 <0.03    Glucose Recent  Labs  Lab 07/20/17 1603 07/21/17 1957  GLUCAP 161* 151*    Imaging Dg Chest Port 1 View  Result Date: 07/22/2017 CLINICAL DATA:  Respiratory failure EXAM: PORTABLE CHEST 1 VIEW COMPARISON:  Two days ago FINDINGS: Tracheal and esophageal extubation. Mild retrocardiac atelectasis, otherwise well inflated. Normal heart size and mediastinal contours. IMPRESSION: Mild retrocardiac atelectasis. Electronically Signed   By: Marnee SpringJonathon  Watts M.D.   On: 07/22/2017 07:11     STUDIES:  CT head 11/13 >> no acute abnormality EEG 11/13 >> generalized irregular slowing  CULTURES: Urine 11/13 >> negative  Sputum 11/13 >> Blood 11/13 >>  ANTIBIOTICS: Vancomycin11/13 >> Zosyn 11/13 >> 11/15  SIGNIFICANT EVENTS: 11/13 Admit  LINES/TUBES: ETT 11/13 >>   DISCUSSION: 41 y/o with altered mental status and VDRF from alcohol intoxication, and hyponatremia.  ASSESSMENT / PLAN:  Acute respiratory failure with compromised airway. - pulmonary hygiene,  IS / mobilize as able  - follow intermittent CXR  Acute metabolic encephalopathy 2nd to hyponatremia. Alcohol intoxication with hx of DTs. - off precedex  - CIWA protocol, liberalize ativan   Hyponatremia. - NS @ 5675ml/hr - follow BMP   Hypothyroidism. - synthroid   Fever with possible aspiration pneumonia. - continue zosyn  - discontinue vanco   Moisture Associated Wound  - continue WOC  - PRN percocet for pain  Depression / Anxiety  Substance Abuse  - PSY consult for recommendations for PSY meds / therapy   DVT prophylaxis - SCDs SUP - protonix Nutrition - NPO Goals of care - full code Global - keep in ICU overnight due to high risk withdrawal > day 3, weakness / wound care.     Canary BrimBrandi Ollis, NP-C [place] [facility] Pgr: [phone] or if no answer [phone] 07/22/2017, 10:58 AM

## 2017-07-22 NOTE — Progress Notes (Signed)
Bladder scanned pt due to no output, scan showed >999, second nurse Ted Mcalpine(Rachel P, RN) verified.   Repositioned & flushed foley with no improvement.   Removed this foley & placed a new one.   See I&O for output. Nursing will continue to monitor.

## 2017-07-22 NOTE — Progress Notes (Signed)
CSW has provided pt with substance abuse resources per pt's request at this time. CSW spoke with pt about treatment options as well as called facilities to see who would be able to assitst pt at the time of discharge. [facility] and was informed [facility] [place] potential [place] available to help pt pay for needed services. CSW confirmed with pt [place] and asked that pt follow up with them for further services at the time of discsharge.      Claude MangesKierra S. Jud Fanguy, MSW, LCSW-A Emergency Department Clinical Social Worker [phone]

## 2017-07-22 NOTE — Progress Notes (Addendum)
CSW attempted to speak with pt at bedside. Pt not wanting to speak to VCSW at this time. CSW will try again when pt is willing to speak with CSW.     Claude MangesKierra S. Krisalyn Yankowski, MSW, LCSW-A Emergency Department Clinical Social Worker [phone]

## 2017-07-22 NOTE — Consult Note (Addendum)
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 CONFIRMED ON SMEAR   RBC 4.26 3.87 - 5.11 MIL/uL  Hemoglobin 13.7 12.0 - 15.0 g/dL   HCT 36.9 36.0 - 46.0 %   MCV 86.6 78.0 - 100.0 fL   MCH 32.2 26.0 - 34.0 pg   MCHC 37.1 (H) 30.0 - 36.0 g/dL    Comment: RULED OUT INTERFERING SUBSTANCES   RDW 15.8 (H) 11.5 - 15.5 %   Platelets 131 (L) 150 - 400 K/uL    Comment: PLATELET COUNT CONFIRMED BY SMEAR  Magnesium     Status: None   Collection Time: 07/21/17  5:22 AM  Result Value Ref Range   Magnesium 2.3 1.7 - 2.4 mg/dL  Phosphorus     Status: Abnormal   Collection Time: 07/21/17  5:22 AM  Result Value Ref Range    Phosphorus 2.2 (L) 2.5 - 4.6 mg/dL  Basic metabolic panel     Status: Abnormal   Collection Time: 07/21/17  8:00 AM  Result Value Ref Range   Sodium 121 (L) 135 - 145 mmol/L   Potassium 3.9 3.5 - 5.1 mmol/L   Chloride 92 (L) 101 - 111 mmol/L   CO2 14 (L) 22 - 32 mmol/L   Glucose, Bld 126 (H) 65 - 99 mg/dL   BUN 17 6 - 20 mg/dL   Creatinine, Ser 0.73 0.44 - 1.00 mg/dL   Calcium 7.9 (L) 8.9 - 10.3 mg/dL   GFR calc non Af Amer >60 >60 mL/min   GFR calc Af Amer >60 >60 mL/min    Comment: (NOTE) The eGFR has been calculated using the CKD EPI equation. This calculation has not been validated in all clinical situations. eGFR's persistently <60 mL/min signify possible Chronic Kidney Disease.    Anion gap 15 5 - 15  Basic metabolic panel     Status: Abnormal   Collection Time: 07/21/17 11:46 AM  Result Value Ref Range   Sodium 122 (L) 135 - 145 mmol/L   Potassium 4.7 3.5 - 5.1 mmol/L   Chloride 93 (L) 101 - 111 mmol/L   CO2 15 (L) 22 - 32 mmol/L   Glucose, Bld 113 (H) 65 - 99 mg/dL   BUN 16 6 - 20 mg/dL   Creatinine, Ser 0.72 0.44 - 1.00 mg/dL   Calcium 7.9 (L) 8.9 - 10.3 mg/dL   GFR calc non Af Amer >60 >60 mL/min   GFR calc Af Amer >60 >60 mL/min    Comment: (NOTE) The eGFR has been calculated using the CKD EPI equation. This calculation has not been validated in all clinical situations. eGFR's persistently <60 mL/min signify possible Chronic Kidney Disease.    Anion gap 14 5 - 15  Triglycerides     Status: Abnormal   Collection Time: 07/21/17 11:46 AM  Result Value Ref Range   Triglycerides 621 (H) <150 mg/dL  Basic metabolic panel     Status: Abnormal   Collection Time: 07/21/17  4:26 PM  Result Value Ref Range   Sodium 121 (L) 135 - 145 mmol/L   Potassium 3.3 (L) 3.5 - 5.1 mmol/L    Comment: DELTA CHECK NOTED   Chloride 93 (L) 101 - 111 mmol/L   CO2 15 (L) 22 - 32 mmol/L   Glucose, Bld 125 (H) 65 - 99 mg/dL   BUN 12 6 - 20 mg/dL   Creatinine, Ser 0.67 0.44 - 1.00  mg/dL   Calcium 7.3 (L) 8.9 - 10.3 mg/dL   GFR calc non Af Amer >60 >60 mL/min   GFR calc Af Amer >60 >60 mL/min    Comment: (NOTE) The  eGFR has been calculated using the CKD EPI equation. This calculation has not been validated in all clinical situations. eGFR's persistently <60 mL/min signify possible Chronic Kidney Disease.    Anion gap 13 5 - 15  Glucose, capillary     Status: Abnormal   Collection Time: 07/21/17  7:57 PM  Result Value Ref Range   Glucose-Capillary 151 (H) 65 - 99 mg/dL   Comment 1 Document in Chart   CBC     Status: Abnormal   Collection Time: 07/22/17  3:08 AM  Result Value Ref Range   WBC 9.4 4.0 - 10.5 K/uL   RBC 3.62 (L) 3.87 - 5.11 MIL/uL   Hemoglobin 11.3 (L) 12.0 - 15.0 g/dL   HCT 31.4 (L) 36.0 - 46.0 %   MCV 86.7 78.0 - 100.0 fL   MCH 31.2 26.0 - 34.0 pg   MCHC 36.0 30.0 - 36.0 g/dL   RDW 15.6 (H) 11.5 - 15.5 %   Platelets 128 (L) 150 - 400 K/uL  Basic metabolic panel     Status: Abnormal   Collection Time: 07/22/17  3:08 AM  Result Value Ref Range   Sodium 123 (L) 135 - 145 mmol/L   Potassium 4.4 3.5 - 5.1 mmol/L    Comment: NO VISIBLE HEMOLYSIS   Chloride 98 (L) 101 - 111 mmol/L   CO2 16 (L) 22 - 32 mmol/L   Glucose, Bld 110 (H) 65 - 99 mg/dL   BUN 10 6 - 20 mg/dL   Creatinine, Ser 0.71 0.44 - 1.00 mg/dL   Calcium 7.7 (L) 8.9 - 10.3 mg/dL   GFR calc non Af Amer >60 >60 mL/min   GFR calc Af Amer >60 >60 mL/min    Comment: (NOTE) The eGFR has been calculated using the CKD EPI equation. This calculation has not been validated in all clinical situations. eGFR's persistently <60 mL/min signify possible Chronic Kidney Disease.    Anion gap 9 5 - 15    Current Facility-Administered Medications  Medication Dose Route Frequency Provider Last Rate Last Dose  . 0.9 %  sodium chloride infusion  250 mL Intravenous PRN Erick Colace, NP      . 0.9 %  sodium chloride infusion   Intravenous Continuous Hammonds, Sharyn Blitz, MD 75 mL/hr at  07/22/17 0600    . acetaminophen (TYLENOL) solution 650 mg  650 mg Oral Q6H PRN Ollis, Brandi L, NP      . folic acid injection 1 mg  1 mg Intravenous Daily Chesley Mires, MD   1 mg at 07/22/17 1200  . Gerhardt's butt cream   Topical QID Hammonds, Sharyn Blitz, MD      . levothyroxine (SYNTHROID, LEVOTHROID) injection 25 mcg  25 mcg Intravenous Daily Chesley Mires, MD   25 mcg at 07/22/17 0911  . LORazepam (ATIVAN) injection 2-4 mg  2-4 mg Intravenous Q1H PRN Ollis, Brandi L, NP      . MEDLINE mouth rinse  15 mL Mouth Rinse BID Chesley Mires, MD   15 mL at 07/22/17 1007  . ondansetron (ZOFRAN) injection 4 mg  4 mg Intravenous Q6H PRN Marijean Heath, NP   4 mg at 07/21/17 2316  . oxyCODONE-acetaminophen (PERCOCET/ROXICET) 5-325 MG per tablet 1-2 tablet  1-2 tablet Oral Q8H PRN Noe Gens L, NP   2 tablet at 07/22/17 1222  . pantoprazole (PROTONIX) injection 40 mg  40 mg Intravenous Q12H Erick Colace, NP   40 mg at 07/22/17 0911  . piperacillin-tazobactam (  ZOSYN) IVPB 3.375 g  3.375 g Intravenous Q8H Rasul, Medina B, Student-PharmD   Stopped at 07/22/17 1031  . promethazine (PHENERGAN) injection 12.5-25 mg  12.5-25 mg Intravenous Q6H PRN Chesley Mires, MD   25 mg at 07/21/17 1755  . thiamine (B-1) injection 100 mg  100 mg Intravenous Daily Chesley Mires, MD   100 mg at 07/22/17 0912    Musculoskeletal: Strength & Muscle Tone: within normal limits Gait & Station: normal Patient leans: N/A  Psychiatric Specialty Exam: Physical Exam  Nursing note and vitals reviewed. Constitutional: She is oriented to person, place, and time. She appears well-developed and well-nourished.  HENT:  Head: Normocephalic and atraumatic.  Neck: Normal range of motion.  Respiratory: Effort normal.  Musculoskeletal: Normal range of motion.  Neurological: She is alert and oriented to person, place, and time.  Skin: No rash noted.  Psychiatric: Her behavior is normal. Judgment and thought content normal.     Review of Systems  Constitutional: Negative for chills and fever.  Gastrointestinal: Negative for constipation, diarrhea, nausea and vomiting.  Skin:       Pain on her bottom secondary to wound.   Psychiatric/Behavioral: Positive for depression and substance abuse. Negative for hallucinations and suicidal ideas. The patient is nervous/anxious. The patient does not have insomnia.     Blood pressure 125/90, pulse (!) 103, temperature 100.2 F (37.9 C), resp. rate 14, height '5\' 7"'  (1.702 m), weight 91.3 kg (201 lb 4.5 oz), SpO2 96 %.Body mass index is 31.52 kg/m.  General Appearance: Well Groomed, overweight Caucasian female with short dyed brunette hair with a hospital gown and body tattoos. NAD.   Eye Contact:  Good  Speech:  Clear and Coherent and Normal Rate  Volume:  Decreased  Mood:  Anxious and Depressed  Affect:  Constricted  Thought Process:  Goal Directed and Linear  Orientation:  Full (Time, Place, and Person)  Thought Content:  Logical  Suicidal Thoughts:  No  Homicidal Thoughts:  No  Memory:  Immediate;   Good Recent;   Good Remote;   Good  Judgement:  Fair  Insight:  Fair  Psychomotor Activity:  Normal  Concentration:  Concentration: Good and Attention Span: Good  Recall:  Good   Fund of Knowledge:  Good  Language:  Good  Akathisia:  No  Handed:  Right  AIMS (if indicated):   N/A  Assets:  Agricultural consultant Housing Social Support  ADL's:  Intact  Cognition:  WNL  Sleep:   Okay   Assessment:  Julie Conley is a 41 y.o. female who was admitted with altered mental status and sepsis of unclear etiology. She reports onset of depression after the death of her husband in 2012-11-28. She reports significant alcohol use since this time which has caused social and occupational problems. It is likely that her current mood problems are attributed to substance use. She is highly recommended to complete rehab for alcohol abuse. She agrees to  start medication for depression and alcohol cravings.     Treatment Plan Summary: -Recommend Cymbalta 30 mg daily for depression and anxiety. -Recommend Gabapentin 300 mg TID for alcoholism.  -Recommend patient complete inpatient rehab treatment for alcoholism.  -Patient is psychiatrically cleared. Will sign off on patient at this time. Please consult psychiatry again as needed.   Disposition: No evidence of imminent risk to self or others at present.   Patient does not meet criteria for psychiatric inpatient admission.  Julie Dingwall, DO 07/22/2017 12:51 PM

## 2017-07-22 NOTE — Care Management Note (Signed)
Case Management Note  Patient Details  Name: Hoy Mornshley Lopp MRN: [id number] Date of Birth: [birth year]  Subjective/Objective:     Pt admitted with AMS from alcohol intoxication              Action/Plan:   PTA independent from home.  CSW consulted for current substance abuse.  Pt does not have PCP nor medication insurance - Pt agreed for CM to [facility] - appts scheduled for 11/28 at 9am.  Pt may need MATCH at discharge.  CM will continue to follow for discharge needs   Expected Discharge Date:                  Expected Discharge Plan:  Home/Self Care  In-House Referral:  Clinical Social Work  Discharge planning Services  CM Consult  Post Acute Care Choice:    Choice offered to:     DME Arranged:    DME Agency:     HH Arranged:    HH Agency:     Status of Service:     If discussed at [employer] of [employer] Meetings, dates discussed:    Additional Comments:  Cherylann ParrClaxton, Brynlie Daza S, RN 07/22/2017, 2:27 PM

## 2017-07-22 NOTE — Clinical Social Work Note (Signed)
Clinical Social Work Assessment  Patient Details  Name: Julie Conley MRN: [id number] Date of Birth: [birth year]  Date of referral:  07/22/17               Reason for consult:  Substance Use/ETOH Abuse                Permission sought to share information with:  Case Manager Permission granted to share information::  Yes, Verbal Permission Granted  Name::     Interior and spatial designeramantha Conley   Agency::     Relationship::     Contact Information:     Housing/Transportation Living arrangements for the past 2 months:  Apartment(with adult children and grandchild. ) Source of Information:  Patient Patient Interpreter Needed:  None Criminal Activity/Legal Involvement Pertinent to Current Situation/Hospitalization:  No - Comment as needed(not that i am aware of. ) Significant Relationships:  Adult Children Lives with:  Adult Children Do you feel safe going back to the place where you live?  Yes Need for family participation in patient care:  Yes (Comment)  Care giving concerns:  CSW spoke with pt at bedside. During this time pt has a number of concerns/stressors that pt feels is impacting pt's need to drink heavily.    Social Worker assessment / plan:  CSW spoke with at bedside. During this time pt informed CSW that pt is from home with adult children and grandchild. Pt informed CSW that pt has had a number of stressors over the past years including the passing of pt's husband in 2014. Pt reports having [facility] in [facility], but has not received any further treatment for ETOH use since then. CSW explained to pt that CSW would call around and see which facilities could meet pt's needs as pt has no insurance and is interested in treatment. CSW provided pt with both Residential and Outpatient Substance Abuse resources. CSW attempted to explain the difference between the two, however pt was in and out of sleep during this time.   Employment status:  Unemployed Health and safety inspectornsurance information:  Self Pay (Medicaid  Pending) PT Recommendations:  Not assessed at this time Information / Referral to community resources:     Patient/Family's Response to care:  At this time pt is understanding and agreeable to services provided/reccomended for pt.   Patient/Family's Understanding of and Emotional Response to Diagnosis, Current Treatment, and Prognosis:  At this time there are no further questions or concerns being presented to CSW at this time.   Emotional Assessment Appearance:  Appears older than stated age Attitude/Demeanor/Rapport:    Affect (typically observed):  Pleasant, Calm Orientation:  Oriented to Self, Oriented to Place Alcohol / Substance use:  Alcohol Use Psych involvement (Current and /or in the community):  Yes (Comment)  Discharge Needs  Concerns to be addressed:  Substance Abuse Concerns Readmission within the last 30 days:  No Current discharge risk:  Substance Abuse Barriers to Discharge:  Inadequate or no insurance, Active Substance Use   Robb MatarKierra S Annaclaire Conley, LCSWA 07/22/2017, 1:21 PM

## 2017-07-22 NOTE — Progress Notes (Signed)
Pt has not had any urine output during this RN's shift.  Minimal urine output (50cc) total for previous shift. Foley assessed for any issues & flushed, will continue to monitor.   Pt was hypothermic, applied warm blankets, heat packs & turned pt room temp up. Temperature continues to rise but is not yet at a normal level. Elink RN aware of all of the above.   No new orders at this time. Nursing will continue to monitor.

## 2017-07-23 ENCOUNTER — Inpatient Hospital Stay (HOSPITAL_COMMUNITY): Payer: Self-pay

## 2017-07-23 DIAGNOSIS — F1010 Alcohol abuse, uncomplicated: Secondary | ICD-10-CM

## 2017-07-23 LAB — BASIC METABOLIC PANEL
Anion gap: 8 (ref 5–15)
BUN: 12 mg/dL (ref 6–20)
CALCIUM: 8.2 mg/dL — AB (ref 8.9–10.3)
CO2: 20 mmol/L — AB (ref 22–32)
CREATININE: 1.04 mg/dL — AB (ref 0.44–1.00)
Chloride: 97 mmol/L — ABNORMAL LOW (ref 101–111)
GLUCOSE: 103 mg/dL — AB (ref 65–99)
Potassium: 3.6 mmol/L (ref 3.5–5.1)
Sodium: 125 mmol/L — ABNORMAL LOW (ref 135–145)

## 2017-07-23 LAB — CBC
HCT: 30.3 % — ABNORMAL LOW (ref 36.0–46.0)
Hemoglobin: 10.3 g/dL — ABNORMAL LOW (ref 12.0–15.0)
MCH: 30.7 pg (ref 26.0–34.0)
MCHC: 34 g/dL (ref 30.0–36.0)
MCV: 90.2 fL (ref 78.0–100.0)
PLATELETS: 150 10*3/uL (ref 150–400)
RBC: 3.36 MIL/uL — ABNORMAL LOW (ref 3.87–5.11)
RDW: 16.3 % — AB (ref 11.5–15.5)
WBC: 10.9 10*3/uL — ABNORMAL HIGH (ref 4.0–10.5)

## 2017-07-23 LAB — VANCOMYCIN, TROUGH: Vancomycin Tr: 12 ug/mL — ABNORMAL LOW (ref 15–20)

## 2017-07-23 LAB — MAGNESIUM: Magnesium: 2.2 mg/dL (ref 1.7–2.4)

## 2017-07-23 MED ORDER — LEVOTHYROXINE SODIUM 50 MCG PO TABS
50.0000 ug | ORAL_TABLET | Freq: Every day | ORAL | Status: DC
  Administered 2017-07-24 – 2017-07-27 (×4): 50 ug via ORAL
  Filled 2017-07-23 (×4): qty 1

## 2017-07-23 MED ORDER — PANTOPRAZOLE SODIUM 40 MG PO TBEC
40.0000 mg | DELAYED_RELEASE_TABLET | Freq: Two times a day (BID) | ORAL | Status: DC
  Administered 2017-07-23 – 2017-07-27 (×9): 40 mg via ORAL
  Filled 2017-07-23 (×9): qty 1

## 2017-07-23 MED ORDER — VITAMIN B-1 100 MG PO TABS
100.0000 mg | ORAL_TABLET | Freq: Every day | ORAL | Status: DC
  Administered 2017-07-23 – 2017-07-27 (×5): 100 mg via ORAL
  Filled 2017-07-23 (×5): qty 1

## 2017-07-23 MED ORDER — DULOXETINE HCL 30 MG PO CPEP
30.0000 mg | ORAL_CAPSULE | Freq: Every day | ORAL | Status: DC
  Administered 2017-07-23 – 2017-07-27 (×5): 30 mg via ORAL
  Filled 2017-07-23 (×5): qty 1

## 2017-07-23 MED ORDER — HYDROMORPHONE HCL 1 MG/ML IJ SOLN: 1.0000 mg | Freq: Three times a day (TID) | INTRAMUSCULAR | Status: DC | PRN

## 2017-07-23 MED ORDER — FOLIC ACID 1 MG PO TABS
1.0000 mg | ORAL_TABLET | Freq: Every day | ORAL | Status: DC
  Administered 2017-07-23 – 2017-07-27 (×5): 1 mg via ORAL
  Filled 2017-07-23 (×5): qty 1

## 2017-07-23 MED ORDER — HYDROMORPHONE HCL 1 MG/ML IJ SOLN
0.5000 mg | Freq: Three times a day (TID) | INTRAMUSCULAR | Status: DC | PRN
  Administered 2017-07-23 – 2017-07-24 (×3): 1 mg via INTRAVENOUS
  Administered 2017-07-25: 0.5 mg via INTRAVENOUS
  Administered 2017-07-25 – 2017-07-26 (×3): 1 mg via INTRAVENOUS
  Filled 2017-07-23 (×7): qty 1

## 2017-07-23 MED ORDER — LORAZEPAM 1 MG PO TABS
1.0000 mg | ORAL_TABLET | ORAL | Status: DC | PRN
  Administered 2017-07-23 – 2017-07-27 (×10): 1 mg via ORAL
  Filled 2017-07-23 (×10): qty 1

## 2017-07-23 NOTE — Evaluation (Signed)
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  She required intubation for airway protection.  Clinical Impression  Pt admitted with above diagnosis. Pt currently with functional limitations due to the deficits listed below (see PT Problem List). Pt was able to stand pivot bed to transport chair with min to mod assist of 2 persons HHA.  Pt with notable red blood dripping down legs from buttocks wound.  Nurse aware.  Cleaned pt of blood and changed soiled socks.  Nurse asked this PT to accompany them to 5W therefore did so. Assisted pt to bed on 5W.  Will follow acutely.  Pt will benefit from skilled PT to increase their independence and safety with mobility to allow discharge to the venue listed below.      Follow Up Recommendations SNF;Supervision/Assistance - 24 hour    Equipment Recommendations  None recommended by PT    Recommendations for Other Services       Precautions / Restrictions Precautions Precautions: Fall Restrictions Weight Bearing Restrictions: No      Mobility  Bed Mobility Overal bed mobility: Needs Assistance Bed Mobility: Rolling;Sidelying to Sit Rolling: Min assist Sidelying to sit: Mod assist;+2 for physical assistance       General bed mobility comments: Needed assist for LEs to EOB.  Pt took incr time to bring LEs off bed and  needed use of pad to prevent sheering as her bottom was very sore.  Pt also needed mod assist to elevate trunk.    Transfers Overall transfer level: Needs assistance Equipment used: 2 person hand held assist Transfers: Sit to/from [employer]Stand;Stand Pivot Transfers Sit to Stand: Min assist;+2 physical assistance;Mod assist;From elevated surface Stand pivot transfers: Min assist;Mod assist;+2 physical  assistance       General transfer comment: Pt needed assist to power up.  Pt was able to stand pivot with 2 person HHA to transport chair as pt was transferring to another unit.  Upon standing, blood dripping from buttock wounds.  Had to clean pt and furniture and floor as it dripped all over her and bed.  Nurse asked this PT to accompany him with pt to her new room on 5W therefore assisted with transfer to bed once to 5W.   Ambulation/Gait                Stairs            Wheelchair Mobility    Modified Rankin (Stroke Patients Only)       Balance Overall balance assessment: Needs assistance;History of Falls Sitting-balance support: Feet supported;Bilateral upper extremity supported Sitting balance-Leahy Scale: Poor Sitting balance - Comments: needed UE support   Standing balance support: Bilateral upper extremity supported;During functional activity Standing balance-Leahy Scale: Poor Standing balance comment: Pt was able to stand with bil UE support mod to min assist.  Very slow moving to move bed to chair.                              Pertinent Vitals/Pain Pain Assessment: 0-10 Pain Score: 10-Worst pain ever Pain Location: buttocks Pain Descriptors / Indicators: Aching;Grimacing;Guarding;Sharp Pain Intervention(s): Monitored during session;Limited activity within patient's tolerance;Repositioned;Premedicated before session  VSS.  Home Living Family/patient expects to be discharged to:: Private residence  Living Arrangements: Children Available Help at Discharge: Family;Available 24 hours/day(per pt she may ) Type of Home: House Home Access: Stairs to enter   [employer] CorporationEntrance Stairs-Number of Steps: 1(moms home has 4 steps if she goes there) Home Layout: One level Home Equipment: Walker - 2 wheels;Wheelchair - manual      Prior Function Level of Independence: Independent               Hand Dominance        Extremity/Trunk Assessment    Upper Extremity Assessment Upper Extremity Assessment: Defer to OT evaluation    Lower Extremity Assessment Lower Extremity Assessment: Generalized weakness    Cervical / Trunk Assessment Cervical / Trunk Assessment: Normal  Communication   Communication: No difficulties  Cognition Arousal/Alertness: Awake/alert Behavior During Therapy: Impulsive;Anxious Overall Cognitive Status: Within Functional Limits for tasks assessed                                        General Comments General comments (skin integrity, edema, etc.): Excoriation on buttocks severe.  Nursing aware.     Exercises     Assessment/Plan    PT Assessment Patient needs continued PT services  PT Problem List Decreased activity tolerance;Decreased balance;Decreased mobility;Decreased knowledge of use of DME;Decreased safety awareness;Decreased knowledge of precautions;Pain;Decreased skin integrity       PT Treatment Interventions DME instruction;Gait training;Functional mobility training;Therapeutic activities;Therapeutic exercise;Stair training;Balance training;Patient/family education    PT Goals (Current goals can be found in the Care Plan section)  Acute Rehab PT Goals Patient Stated Goal: to go home PT Goal Formulation: With patient Time For Goal Achievement: 08/06/17 Potential to Achieve Goals: Good    Frequency Min 3X/week   Barriers to discharge        Co-evaluation               AM-PAC PT "6 Clicks" Daily Activity  Outcome Measure Difficulty turning over in bed (including adjusting bedclothes, sheets and blankets)?: Unable Difficulty moving from lying on back to sitting on the side of the bed? : Unable Difficulty sitting down on and standing up from a chair with arms (e.g., wheelchair, bedside commode, etc,.)?: Unable Help needed moving to and from a bed to chair (including a wheelchair)?: A Lot Help needed walking in hospital room?: Total Help needed climbing 3-5  steps with a railing? : Total 6 Click Score: 7    End of Session Equipment Utilized During Treatment: Gait belt Activity Tolerance: Patient limited by fatigue;Patient limited by pain Patient left: in bed;with call bell/phone within reach Nurse Communication: Mobility status PT Visit Diagnosis: Muscle weakness (generalized) (M62.81);Pain Pain - part of body: (buttocks)    Time: [id number] PT Time Calculation (min) (ACUTE ONLY): 48 min   Charges:   PT Evaluation $PT Eval Moderate Complexity: 1 Mod PT Treatments $Therapeutic Activity: 23-37 mins   PT G Codes:        Julie Conley,PT Acute Rehabilitation [phone] (pager)   Julie Lopesawn F Caeson Filippi 07/23/2017, 12:42 PM

## 2017-07-23 NOTE — Progress Notes (Addendum)
NURSING PROGRESS NOTE  Julie Conley [id number] Transfer Data: 07/23/2017 2:42 PM Attending Provider: Coralyn HellingSood, Vineet, MD WUJ:[id number]:Patient, No Pcp Per Code Status: full  Allergies:  Adhesive [tape]; Ambien [zolpidem tartrate]; Latex; and Sulfa antibiotics Past Medical History:   has a past medical history of Alcohol abuse, Anxiety, Bimalleolar fracture of left ankle (04/2015), Complication of anesthesia, Depression, GERD (gastroesophageal reflux disease), Headache, Hypothyroidism (2010-2012), Iron deficiency anemia, Migraines, Pneumonia ("several times"), and Sciatic pain (04/12/2015). Past Surgical History:   has a past surgical history that includes Cesarean section (1996; 2000); Tonsillectomy (~ 1981); Roux-en-Y Gastric Bypass (2009); Abdominoplasty (2015); laparotomy (2011); Vaginal hysterectomy (2013); Cystoscopy with hydrodistension and biopsy (2010); Endometrial ablation (2010); Dilation and curettage of uterus (2010); ORIF ankle fracture (Left, 04/15/2015); Laparoscopic cholecystectomy (2006); Appendectomy (~ 1986); Fracture surgery; Augmentation mammaplasty (2015); Laparoscopic tubal ligation (2001); and Nasal septoplasty w/ turbinoplasty (1991; 2008). Social History:   reports that she has been smoking cigarettes.  She has a 0.12 pack-year smoking history. she has never used smokeless tobacco. She reports that she drinks alcohol. She reports that she does not use drugs.  Julie Conley is a 41 y.o. female patient transferred from 34M  Blood pressure 120/76, pulse 91, temperature 98.3 F (36.8 C), temperature source Oral, resp. rate 16, height 5\' 4"  (1.626 m), weight 86.5 kg (190 lb 11.2 oz), SpO2 98 %.  Cardiac Monitoring: Box # 28 in place. Cardiac monitor yields:normal sinus rhythm.  IV Fluids:  IV in place, occlusive dsg intact without redness, IV cath antecubital right, condition patent and no redness normal saline.   Skin: MASD to back and buttocks   Will continue to evaluate and treat  per MD orders.   Julie RearLonnie Kinsey Cowsert, MSN, RN, Reliant EnergyCMSRN

## 2017-07-23 NOTE — Progress Notes (Signed)
PULMONARY / CRITICAL CARE MEDICINE   Name: Hoy Mornshley Fehl MRN: [id number] DOB: 1976-04-08    ADMISSION DATE:  07/20/2017  REFERRING MD:  Dr. 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  She required intubation for airway protection. PMHx of depression, HA, hypothyroidism, GERD, s/p roux en y gastric bypass, iron deficiency anemia, sciatica.  SUBJECTIVE:  RN reports pt appears as though she is medicated drowsy.  Respirations reportedly slowed with 2mg  dilaudid given on night shift.  Pt has not required ativan / low CIWA score.  Pt states she is seeing flashes of light and does not want to transfer out of ICU.    VITAL SIGNS: BP (!) 148/101   Pulse 100   Temp 99.5 F (37.5 C)   Resp 20   Ht 5\' 7"  (1.702 m)   Wt 203 lb 11.3 oz (92.4 kg)   SpO2 96%   BMI 31.90 kg/m   VENTILATOR SETTINGS:    INTAKE / OUTPUT: I/O last 3 completed shifts: In: 4114 [P.O.:780; I.V.:2934; IV Piggyback:400] Out: 2425 [Urine:2425]  PHYSICAL EXAMINATION: General: adult female in NAD HEENT: MM pink/moist, no JVD PSY: flat affect Neuro: AAOx4, calm, speech clear, MAE / generalized weakness  CV: s1s2 rrr, no m/r/g PULM: even/non-labored, lungs bilaterally clear  QM:VHQIGI:soft, non-tender, bsx4 active  Extremities: warm/dry, no edema  Skin: no rashes or lesions, multiple tattoos, sacral / back moisture wound   LABS:  BMET Recent Labs  Lab 07/21/17 1626 07/22/17 0308 07/23/17 0500  NA 121* 123* 125*  K 3.3* 4.4 3.6  CL 93* 98* 97*  CO2 15* 16* 20*  BUN 12 10 12   CREATININE 0.67 0.71 1.04*  GLUCOSE 125* 110* 103*    Electrolytes Recent Labs  Lab 07/20/17 1627  07/21/17 0522  07/21/17 1626 07/22/17 0308 07/23/17 0500  CALCIUM 7.8*   < >  --    < > 7.3* 7.7* 8.2*  MG 2.4  --  2.3  --   --   --  2.2  PHOS 2.0*  --  2.2*  --    --   --   --    < > = values in this interval not displayed.    CBC Recent Labs  Lab 07/21/17 0522 07/22/17 0308 07/23/17 0500  WBC 7.5 9.4 10.9*  HGB 13.7 11.3* 10.3*  HCT 36.9 31.4* 30.3*  PLT 131* 128* 150    Coag's Recent Labs  Lab 07/20/17 1330 07/20/17 1627  APTT  --  28  INR 0.98 1.06    Sepsis Markers Recent Labs  Lab 07/20/17 1338 07/20/17 1627 07/20/17 2129  LATICACIDVEN 2.84* 2.4* 2.7*  PROCALCITON  --  2.30  --     ABG Recent Labs  Lab 07/20/17 1433 07/21/17 0418  PHART 7.490* 7.471*  PCO2ART 20.9* VALUE BELOW REPORTABLE RANGE  PO2ART 399.0* 159*    Liver Enzymes Recent Labs  Lab 07/20/17 1330 07/20/17 1627  AST 49* 41  ALT 31 26  ALKPHOS 100 86  BILITOT 1.7* 1.4*  ALBUMIN 2.9* 2.5*    Cardiac Enzymes Recent Labs  Lab 07/20/17 1627 07/20/17 2129 07/21/17 0108  TROPONINI <0.03 <0.03 <0.03    Glucose Recent Labs  Lab 07/20/17 1603 07/21/17 1957  GLUCAP 161* 151*    Imaging Dg Chest Port 1 View  Result Date: 07/23/2017 CLINICAL DATA:  Atelectasis EXAM: PORTABLE CHEST 1 VIEW COMPARISON:  July 22, 2017 FINDINGS: Slight atelectasis in the left base remains. Lungs elsewhere clear. Heart size and pulmonary vascularity are normal. No adenopathy. No bone lesions. IMPRESSION: Slight left base atelectasis. Lungs elsewhere clear. Cardiac silhouette within normal limits. Electronically Signed   By: Bretta BangWilliam  Woodruff III M.D.   On: 07/23/2017 07:15     STUDIES:  CT head 11/13 >> no acute abnormality EEG 11/13 >> generalized irregular slowing  CULTURES: Urine 11/13 >> negative  Sputum 11/13 >> Blood 11/13 >> C-Diff 11/13 >> negative   ANTIBIOTICS: Vancomycin11/13 >> 11/15 Zosyn 11/13 >>   SIGNIFICANT EVENTS: 11/13 Admit  LINES/TUBES: ETT 11/13 >>   DISCUSSION: 41 y/o with altered mental status and VDRF from alcohol intoxication, and hyponatremia.  ASSESSMENT / PLAN:  Acute respiratory failure with compromised  airway. - pulmonary hygiene, IS, mobilize  - follow intermittent CXR   Acute metabolic encephalopathy 2nd to hyponatremia. Alcohol intoxication with hx of DTs. - CIWA protocol   Hyponatremia. - Trend BMP  - Continue NS at 50 ml/hr for now  Hypertension  - continue lopressor 25mg  BID   Hypothyroidism. - synthroid   Fever with possible aspiration pneumonia. - abx as above   Moisture Associated Wound  - WOC for moisture associated wounds  - PRN dilaudid for wound care - PRN percocet for pain   Depression / Anxiety  Substance Abuse  - PSY recommendations noted, appreciate input  - Begin Cymbalta 30 mg QD, monitor effect - Hold neurontin for now > monitor effect of above before starting 2nd new agent    DVT prophylaxis - SCDs SUP - protonix Nutrition - Advance to regular diet  Goals of care - full code Global - Transfer to Med-Surg, TRH as of 11/17 am     Canary BrimBrandi Ollis, NP-C [facility] Pgr: [phone] or if no answer [phone] 07/23/2017, 9:45 AM

## 2017-07-24 ENCOUNTER — Inpatient Hospital Stay (HOSPITAL_COMMUNITY): Payer: Self-pay

## 2017-07-24 DIAGNOSIS — K219 Gastro-esophageal reflux disease without esophagitis: Secondary | ICD-10-CM | POA: Diagnosis present

## 2017-07-24 LAB — COMPREHENSIVE METABOLIC PANEL
ALT: 15 U/L (ref 14–54)
ANION GAP: 8 (ref 5–15)
AST: 25 U/L (ref 15–41)
Albumin: 2.1 g/dL — ABNORMAL LOW (ref 3.5–5.0)
Alkaline Phosphatase: 56 U/L (ref 38–126)
BUN: 8 mg/dL (ref 6–20)
CHLORIDE: 94 mmol/L — AB (ref 101–111)
CO2: 23 mmol/L (ref 22–32)
Calcium: 7.8 mg/dL — ABNORMAL LOW (ref 8.9–10.3)
Creatinine, Ser: 0.85 mg/dL (ref 0.44–1.00)
Glucose, Bld: 95 mg/dL (ref 65–99)
POTASSIUM: 3.7 mmol/L (ref 3.5–5.1)
Sodium: 125 mmol/L — ABNORMAL LOW (ref 135–145)
Total Bilirubin: 0.5 mg/dL (ref 0.3–1.2)
Total Protein: 4.9 g/dL — ABNORMAL LOW (ref 6.5–8.1)

## 2017-07-24 LAB — CBC
HCT: 24.1 % — ABNORMAL LOW (ref 36.0–46.0)
HEMOGLOBIN: 8.3 g/dL — AB (ref 12.0–15.0)
MCH: 31.4 pg (ref 26.0–34.0)
MCHC: 34.4 g/dL (ref 30.0–36.0)
MCV: 91.3 fL (ref 78.0–100.0)
PLATELETS: 157 10*3/uL (ref 150–400)
RBC: 2.64 MIL/uL — AB (ref 3.87–5.11)
RDW: 16.6 % — AB (ref 11.5–15.5)
WBC: 5.6 10*3/uL (ref 4.0–10.5)

## 2017-07-24 MED ORDER — GABAPENTIN 300 MG PO CAPS
300.0000 mg | ORAL_CAPSULE | Freq: Three times a day (TID) | ORAL | Status: DC
  Administered 2017-07-24 – 2017-07-27 (×10): 300 mg via ORAL
  Filled 2017-07-24 (×10): qty 1

## 2017-07-24 MED ORDER — OXYCODONE-ACETAMINOPHEN 5-325 MG PO TABS: 1.0000 | ORAL_TABLET | Freq: Four times a day (QID) | ORAL | Status: DC | PRN

## 2017-07-24 MED ORDER — OXYCODONE-ACETAMINOPHEN 5-325 MG PO TABS
1.0000 | ORAL_TABLET | Freq: Four times a day (QID) | ORAL | Status: DC | PRN
  Administered 2017-07-24 – 2017-07-27 (×10): 2 via ORAL
  Filled 2017-07-24 (×10): qty 2

## 2017-07-24 MED ORDER — INFLUENZA VAC SPLIT QUAD 0.5 ML IM SUSY
0.5000 mL | PREFILLED_SYRINGE | INTRAMUSCULAR | Status: AC
  Administered 2017-07-25: 0.5 mL via INTRAMUSCULAR
  Filled 2017-07-24: qty 0.5

## 2017-07-24 NOTE — Progress Notes (Signed)
PROGRESS NOTE  Julie Conley WUJ:[id number] DOB: 1976/08/21 DOA: 07/20/2017 PCP: Patient, No Pcp Per  HPI/Recap of past 24 hours:  Julie Conley is a 41 y.o. year old female with medical history significant for depression,  hypothyroidism, GERD, s/p roux en y gastric bypass, iron deficiency anemia,  who presented on 07/20/2017 with with decreased responsiveness concerning for alcohol intoxication.  Patient's initial presentation, family found patient down with minimal responsiveness arrival to ED patient needed intubation for protection of airway.  Vitals at the time noted forFever with a T-max of 102.2, tachypnea to 28, heart rate 130, blood pressure 165/146.  Ethanol level was less than 10.  Other lab work notable for sodium 112, osmolality 257, TSH 10.9, creatinine 1.42, CO2 16, potassium 5.3, lactic acid 2.84 Tylenol level less than 10, UDS negative.  UA was unremarkable on admission.  During time in ICU patient's hyponatremia and AK I improved  with normal saline fluid resuscitation.  Patient was treated with Unasyn for possible aspiration pneumonia, of note chest x-ray on admission showed no concerning signs or opacities however given septic profile on presentation antibiotics were continued  This morning she complains of pain near her rectum after recent dressing change. Otherwise denies any chest pain,abdominal pain.   Assessment/Plan: Principal Problem:   Alcohol-induced mood disorder (HCC) Active Problems:   Hypothyroidism   Hyponatremia   Depression   History of gastric bypass   Acute encephalopathy   Pressure injury of skin   Alcohol abuse   GERD (gastroesophageal reflux disease)  Acute metabolic encephalopathy, multifactorial-resolved Likely secondary to severe hyponatremia, pneumonia, alcohol withdrawal, AKI  Acute respiratory failure, secondary to alcohol withdrawal and severe hyponatremia, resolved  Sepsis, resolved Likely secondary to Stage 2 Pressure Injury  buttocks/perineum No evidence of pneumonia on CXR. Lungs sound clear on exam Likely pneumonitis in setting of decreased mental status which would not need antibiotics On presentation febrile, tachycardic, no leukocytosis however severe pressure injury on buttocks/perineum found which could be potential source. Blood cultures have remained negative - on IV zosyn will de-escalate to oral antibiotic to cover anaerobes  HypovolemicHyponatremia, improving Likely multifactorial in setting of poor p.o. Intake, alcoholic in addition to decreased solutes in patient with alcohol abuse - IVF timed for 24 more hours -encouraged hydration -monitor BMP  Stage 2 Pressure Ulcer, present on admission Includes buttockes and extends to posterior -wound care following: dressing and cream recs, pressure mattress bed -rectal tube in place -IV dilaudid  Alcohol abuse Ethanol level on admission less than 10 Required Precedex drip during time in ICU for withdrawal Continue to monitor on CIWA protocol for withdrawal, ativan PRN per CIWA scale -thiamine, folic acid  Depression [facility] consulted, likely mood associated with substance abuse -Cymbalta 30 mg, gabapentin 300 mg TID -psych recommended inpatient rehab treatment for alcoholism - does not meet criteria for psych inpt admission   Hypothyroidism TSH elevated greater than 10 on admission -Continue Synthroid 50 mcg (before breakfast)  Hypertension, controlled -Continue home metoprolol  GERD -home protonix  AKI, resolved Improved with fluid resuscitation   Code Status: Full Code   Family Communication: No family at bedside   Disposition Plan: PT recommends SNF   Consultants:  [facility], Dr. Juanetta Beets   Procedures:  *ETT 11/13 >> 11/14  EEG 11/13 >> generalized irregular slowing    Antimicrobials: Vancomycin11/13 >>11/13 Zosyn 11/13 >> Current     Cultures: Urine 11/13 >> negative  Sputum 11/13 >> Blood 11/13  >>     DVT prophylaxis:  SCDs   Objective: Vitals:   07/23/17 1000 07/23/17 1300 07/23/17 2104 07/24/17 0500  BP: (!) 137/93 120/76 133/75 122/71  Pulse: 86 91 99 85  Resp: 10 16 17 18   Temp: 99.1 F (37.3 C) 98.3 F (36.8 C) 97.6 F (36.4 C) 98.2 F (36.8 C)  TempSrc:  Oral Oral Oral  SpO2: 98% 98% 99% 100%  Weight:  86.5 kg (190 lb 11.2 oz)    Height:  5\' 4"  (1.626 m)      Intake/Output Summary (Last 24 hours) at 07/24/2017 1050 Last data filed at 07/24/2017 0500 Gross per 24 hour  Intake 520 ml  Output 500 ml  Net 20 ml   Filed Weights   07/22/17 0500 07/23/17 0400 07/23/17 1300  Weight: 91.3 kg (201 lb 4.5 oz) 92.4 kg (203 lb 11.3 oz) 86.5 kg (190 lb 11.2 oz)    Exam:   General:  lyin n bed comfortably, in no distress  Cardiovascular: regular rate and rhythm, no M,R,or G   Respiratory: lungs clear bilaterally, on room iar  Abdomen: soft, non-distended, non-tender   Skin: no rashes, rectal tube in place draining  Neurologic: alert and oriented to person, place, time, and context  Psychiatry: appropriate affect and mood    Data Reviewed: CBC: Recent Labs  Lab 07/20/17 1330 07/20/17 1338 07/21/17 0522 07/22/17 0308 07/23/17 0500  WBC 8.9  --  7.5 9.4 10.9*  NEUTROABS 5.6  --   --   --   --   HGB 16.4* 17.0* 13.7 11.3* 10.3*  HCT 43.6 50.0* 36.9 31.4* 30.3*  MCV 85.0  --  86.6 86.7 90.2  PLT 178  --  131* 128* 150   Basic Metabolic Panel: Recent Labs  Lab 07/20/17 1330  07/20/17 1627  07/21/17 0522  07/21/17 1146 07/21/17 1626 07/22/17 0308 07/23/17 0500 07/24/17 0457  NA 112*   < > 115*   < >  --    < > 122* 121* 123* 125* 125*  K 5.9*   < > 4.9   < >  --    < > 4.7 3.3* 4.4 3.6 3.7  CL 82*   < > 90*   < >  --    < > 93* 93* 98* 97* 94*  CO2 16*  --  10*   < >  --    < > 15* 15* 16* 20* 23  GLUCOSE 190*   < > 155*   < >  --    < > 113* 125* 110* 103* 95  BUN 37*   < > 34*   < >  --    < > 16 12 10 12 8   CREATININE 1.42*   < >  1.01*   < >  --    < > 0.72 0.67 0.71 1.04* 0.85  CALCIUM 8.3*  --  7.8*   < >  --    < > 7.9* 7.3* 7.7* 8.2* 7.8*  MG 2.7*  --  2.4  --  2.3  --   --   --   --  2.2  --   PHOS  --   --  2.0*  --  2.2*  --   --   --   --   --   --    < > = values in this interval not displayed.   GFR: Estimated Creatinine Clearance: 92.7 mL/min (by C-G formula based on SCr of 0.85 mg/dL). Liver Function Tests: Recent Labs  Lab 07/20/17 1330 07/20/17 1627 07/24/17 0457  AST 49* 41 25  ALT 31 26 15   ALKPHOS 100 86 56  BILITOT 1.7* 1.4* 0.5  PROT 6.6 5.8* 4.9*  ALBUMIN 2.9* 2.5* 2.1*   Recent Labs  Lab 07/20/17 1627  LIPASE 48   No results for input(s): AMMONIA in the last 168 hours. Coagulation Profile: Recent Labs  Lab 07/20/17 1330 07/20/17 1627  INR 0.98 1.06   Cardiac Enzymes: Recent Labs  Lab 07/20/17 1330 07/20/17 1627 07/20/17 2129 07/21/17 0108  CKTOTAL 276* 300*  --   --   CKMB  --  8.0*  --   --   TROPONINI  --  <0.03 <0.03 <0.03   BNP (last 3 results) No results for input(s): PROBNP in the last 8760 hours. HbA1C: No results for input(s): HGBA1C in the last 72 hours. CBG: Recent Labs  Lab 07/20/17 1603 07/21/17 1957  GLUCAP 161* 151*   Lipid Profile: Recent Labs    07/21/17 1146  TRIG 621*   Thyroid Function Tests: No results for input(s): TSH, T4TOTAL, FREET4, T3FREE, THYROIDAB in the last 72 hours. Anemia Panel: No results for input(s): VITAMINB12, FOLATE, FERRITIN, TIBC, IRON, RETICCTPCT in the last 72 hours. Urine analysis:    Component Value Date/Time   COLORURINE AMBER (A) 07/20/2017 1431   APPEARANCEUR HAZY (A) 07/20/2017 1431   LABSPEC 1.026 07/20/2017 1431   PHURINE 5.0 07/20/2017 1431   GLUCOSEU NEGATIVE 07/20/2017 1431   HGBUR SMALL (A) 07/20/2017 1431   BILIRUBINUR SMALL (A) 07/20/2017 1431   KETONESUR 5 (A) 07/20/2017 1431   PROTEINUR 100 (A) 07/20/2017 1431   UROBILINOGEN 1.0 02/26/2013 1916   NITRITE NEGATIVE 07/20/2017 1431    LEUKOCYTESUR NEGATIVE 07/20/2017 1431   Sepsis Labs: @LABRCNTIP (procalcitonin:4,lacticidven:4)  ) Recent Results (from the past 240 hour(s))  Urine culture     Status: None   Collection Time: 07/20/17  2:31 PM  Result Value Ref Range Status   Specimen Description URINE, CATHETERIZED  Final   Special Requests NONE  Final   Culture NO GROWTH  Final   Report Status 07/21/2017 FINAL  Final  MRSA PCR Screening     Status: None   Collection Time: 07/20/17  4:14 PM  Result Value Ref Range Status   MRSA by PCR NEGATIVE NEGATIVE Final    Comment:        The GeneXpert MRSA Assay (FDA approved for NASAL specimens only), is one component of a comprehensive MRSA colonization surveillance program. It is not intended to diagnose MRSA infection nor to guide or monitor treatment for MRSA infections.   Culture, blood (routine x 2)     Status: None (Preliminary result)   Collection Time: 07/20/17  4:30 PM  Result Value Ref Range Status   Specimen Description BLOOD LEFT ANTECUBITAL  Final   Special Requests   Final    BOTTLES DRAWN AEROBIC AND ANAEROBIC Blood Culture adequate volume   Culture NO GROWTH 3 DAYS  Final   Report Status PENDING  Incomplete  Culture, blood (routine x 2)     Status: None (Preliminary result)   Collection Time: 07/20/17  4:45 PM  Result Value Ref Range Status   Specimen Description BLOOD BLOOD LEFT FOREARM  Final   Special Requests   Final    BOTTLES DRAWN AEROBIC AND ANAEROBIC Blood Culture adequate volume   Culture NO GROWTH 3 DAYS  Final   Report Status PENDING  Incomplete  C difficile quick scan w PCR reflex  Status: None   Collection Time: 07/20/17  4:48 PM  Result Value Ref Range Status   C Diff antigen NEGATIVE NEGATIVE Final   C Diff toxin NEGATIVE NEGATIVE Final   C Diff interpretation No C. difficile detected.  Final      Studies: Dg Chest Port 1 View  Result Date: 07/24/2017 CLINICAL DATA:  Acute respiratory failure with hypoxia EXAM:  PORTABLE CHEST 1 VIEW COMPARISON:  07/23/2017 FINDINGS: Lungs are clear. Heart is normal size. No effusions or acute bony abnormality. IMPRESSION: No active disease. Electronically Signed   By: Charlett NoseKevin  Dover M.D.   On: 07/24/2017 08:04    Scheduled Meds: . DULoxetine  30 mg Oral Daily  . folic acid  1 mg Oral Daily  . gabapentin  300 mg Oral TID  . Gerhardt's butt cream   Topical QID  . [START ON 07/25/2017] Influenza vac split quadrivalent PF  0.5 mL Intramuscular Tomorrow-1000  . levothyroxine  50 mcg Oral QAC breakfast  . metoprolol tartrate  25 mg Oral BID  . pantoprazole  40 mg Oral BID  . thiamine  100 mg Oral Daily    Continuous Infusions: . sodium chloride    . sodium chloride 50 mL/hr at 07/23/17 1555  . piperacillin-tazobactam (ZOSYN)  IV Stopped (07/24/17 0515)     LOS: 4 days     Laverna PeaceShayla D Harlo Jaso, MD [facility] Pager [phone]  If 7PM-7AM, please contact night-coverage [email or web address] Password [email or web address] 07/24/2017, 10:50 AM

## 2017-07-25 LAB — CBC
HCT: 25.9 % — ABNORMAL LOW (ref 36.0–46.0)
HEMOGLOBIN: 8.8 g/dL — AB (ref 12.0–15.0)
MCH: 31.2 pg (ref 26.0–34.0)
MCHC: 34 g/dL (ref 30.0–36.0)
MCV: 91.8 fL (ref 78.0–100.0)
PLATELETS: 177 10*3/uL (ref 150–400)
RBC: 2.82 MIL/uL — AB (ref 3.87–5.11)
RDW: 16.8 % — ABNORMAL HIGH (ref 11.5–15.5)
WBC: 5.9 10*3/uL (ref 4.0–10.5)

## 2017-07-25 LAB — BASIC METABOLIC PANEL
ANION GAP: 8 (ref 5–15)
CHLORIDE: 100 mmol/L — AB (ref 101–111)
CO2: 24 mmol/L (ref 22–32)
Calcium: 7.8 mg/dL — ABNORMAL LOW (ref 8.9–10.3)
Creatinine, Ser: 0.58 mg/dL (ref 0.44–1.00)
Glucose, Bld: 148 mg/dL — ABNORMAL HIGH (ref 65–99)
POTASSIUM: 3.2 mmol/L — AB (ref 3.5–5.1)
SODIUM: 132 mmol/L — AB (ref 135–145)

## 2017-07-25 LAB — CULTURE, BLOOD (ROUTINE X 2)
Culture: NO GROWTH
Culture: NO GROWTH
SPECIAL REQUESTS: ADEQUATE
Special Requests: ADEQUATE

## 2017-07-25 MED ORDER — LIDOCAINE 4 % EX CREA
TOPICAL_CREAM | Freq: Four times a day (QID) | CUTANEOUS | Status: DC | PRN
  Administered 2017-07-25 – 2017-07-26 (×3): 1 via TOPICAL
  Filled 2017-07-25 (×4): qty 5

## 2017-07-25 MED ORDER — AMOXICILLIN-POT CLAVULANATE 875-125 MG PO TABS
1.0000 | ORAL_TABLET | Freq: Two times a day (BID) | ORAL | Status: DC
  Administered 2017-07-25 – 2017-07-27 (×5): 1 via ORAL
  Filled 2017-07-25 (×5): qty 1

## 2017-07-25 NOTE — Progress Notes (Addendum)
PROGRESS NOTE  Julie Conley RUE:[id number] DOB: Jan 17, 1976 DOA: 07/20/2017 PCP: Patient, No Pcp Per  HPI/Recap of past 24 hours:  Julie Conley is a 41 y.o. year old female with medical history significant for depression,  hypothyroidism, GERD, s/p roux en y gastric bypass, iron deficiency anemia, who presented on 07/20/2017 with with decreased responsiveness concerning for alcohol intoxication.  Patient's initial presentation, family found patient down with minimal responsiveness arrival to ED patient needed intubation for protection of airway.  On presentation, T-max of 102.2, tachypnea to 28, heart rate 130, blood pressure 165/146.  Ethanol level was less than 10.  Other lab work notable for sodium 112, osmolality 257, TSH 10.9, creatinine 1.42, CO2 16, potassium 5.3, lactic acid 2.84 Tylenol level less than 10, UDS negative.  UA was unremarkable on admission.  During time in ICU patient's hyponatremia and AK I improved  with normal saline fluid resuscitation.  Patient was treated with Vancomycin and Zosyn ( previously stated as unasyn incorrectly in my prior progress note) for possible aspiration pneumonia, of note chest x-ray on admission showed no concerning signs or opacities however given septic profile on presentation antibiotics were continued.  Overnight patient stated to Flexi-Seal was changed which was painful outside of that denies any new complaints of chest pain, abdominal pain, shortness of breath, cough.   Assessment/Plan: Principal Problem:   Alcohol-induced mood disorder (HCC) Active Problems:   Hypothyroidism   Hyponatremia   Depression   History of gastric bypass   Acute encephalopathy   Pressure injury of skin   Alcohol abuse   GERD (gastroesophageal reflux disease)  Acute metabolic encephalopathy, multifactorial (hyponatremia, pneumonia, alcohol withdrawal,)-resolved  Acute respiratory failure, secondary to alcohol withdrawal and severe hyponatremia,  resolved  Sepsis, resolved Patient empirically treated with vancomycin and Zosyn for presumed aspiration pneumonia.  However on my review of chest x-ray no concerning signs of opacities additionally patient does not have any respiratory symptoms.  Based on physical exam only localizing area for potential infection would be stage II pressure injury to buttocks and perineum.  In addition to negative blood cultures confidently discontinue vancomycin with lwss concern for MRSA. De-escalate to augmentin( to complete in 3 days) to cover anaerobes  Hypovolemic Hyponatremia, improving Likely multifactorial in setting of poor p.o. Intake, in addition to decreased solutes in patient with alcohol abuse.  He has improved greatly with discontinuation of IV fluids Continue to monitor on BMP, encouraged oral hydration  Stage 2 Pressure Ulcer, present on admission Related to being found down for unknown amount of time.  Potential source of infection given patient presented in sepsis.  Blood cultures remain negative for any MRSA De-escalation to Augmentin Wound care nurse following will follow recommendations, continue rectal tube (Dilaudid as needed dressing changes)   Alcohol abuse No symptoms of withdrawal currently.  Earlier in admission required Precedex drip during stay in ICU. We will continue to monitor on CIWA protocol Continue thiamine and folic acid supplementation   Depression From psych standpoint no concern for acute rehabilitation with inpatient admission.  Mood most likely associated with substance abuse Cymbalta 30 mg, gabapentin 300 mg TID psych recommended inpatient rehab treatment for alcoholism    Hypothyroidism TSH elevated greater than 10 on admission -Continue Synthroid 50 mcg (before breakfast), will need to further reevaluate as an outpatient  Hypertension, controlled -Continue home metoprolol  GERD -home protonix  AKI, resolved Presumed prerenal etiology in the  setting of poor p.o. intake, the patient being found down for unknown amount  of time.  Improved with fluid resuscitation   Code Status: Full Code   Family Communication: No family at bedside   Disposition Plan: PT recommends SNF   [facility]  [facility] Dr. Juanetta BeetsJacqueline Norman   Procedures:  *ETT 11/13 >> 11/14  EEG 11/13 >> generalized irregular slowing    Antimicrobials: Vancomycin11/13 >>11/13 Zosyn 11/13 >> 11/18 Augmetin 11/18>>    Cultures: Urine 11/13 >> negative  Sputum 11/13 >> Blood 11/13 >>   DVT prophylaxis: SCDs   Objective: Vitals:   07/24/17 0500 07/24/17 1336 07/24/17 2159 07/25/17 0559  BP: 122/71 121/77 128/71 129/72  Pulse: 85 89 97 87  Resp: 18 18 18 18   Temp: 98.2 F (36.8 C) 97.8 F (36.6 C) 98.1 F (36.7 C) 98.3 F (36.8 C)  TempSrc: Oral Oral    SpO2: 100% 98% 100% 100%  Weight:      Height:        Intake/Output Summary (Last 24 hours) at 07/25/2017 1203 Last data filed at 07/25/2017 1131 Gross per 24 hour  Intake 918 ml  Output 2776 ml  Net -1858 ml   Filed Weights   07/22/17 0500 07/23/17 0400 07/23/17 1300  Weight: 91.3 kg (201 lb 4.5 oz) 92.4 kg (203 lb 11.3 oz) 86.5 kg (190 lb 11.2 oz)    Exam:   General:  Lying in bed comfortably, in no distress  HEENT: dry oral mucosa  Cardiovascular: regular rate and rhythm, no M,R,or G   Abdomen: soft, non-distended, non-tender   Skin:  rectal tube in place draining, dressing in place on lower abdomen  Neurologic: alert and oriented to person, place, time, and context  Psychiatry: appropriate affect and mood    Data Reviewed: CBC: Recent Labs  Lab 07/20/17 1330  07/21/17 0522 07/22/17 0308 07/23/17 0500 07/24/17 1036 07/25/17 0727  WBC 8.9  --  7.5 9.4 10.9* 5.6 5.9  NEUTROABS 5.6  --   --   --   --   --   --   HGB 16.4*   < > 13.7 11.3* 10.3* 8.3* 8.8*  HCT 43.6   < > 36.9 31.4* 30.3* 24.1* 25.9*  MCV 85.0  --  86.6 86.7 90.2 91.3 91.8  PLT 178  --  131*  128* 150 157 177   < > = values in this interval not displayed.   Basic Metabolic Panel: Recent Labs  Lab 07/20/17 1330  07/20/17 1627  07/21/17 0522  07/21/17 1626 07/22/17 0308 07/23/17 0500 07/24/17 0457 07/25/17 0727  NA 112*   < > 115*   < >  --    < > 121* 123* 125* 125* 132*  K 5.9*   < > 4.9   < >  --    < > 3.3* 4.4 3.6 3.7 3.2*  CL 82*   < > 90*   < >  --    < > 93* 98* 97* 94* 100*  CO2 16*  --  10*   < >  --    < > 15* 16* 20* 23 24  GLUCOSE 190*   < > 155*   < >  --    < > 125* 110* 103* 95 148*  BUN 37*   < > 34*   < >  --    < > 12 10 12 8  <5*  CREATININE 1.42*   < > 1.01*   < >  --    < > 0.67 0.71 1.04* 0.85 0.58  CALCIUM 8.3*  --  7.8*   < >  --    < > 7.3* 7.7* 8.2* 7.8* 7.8*  MG 2.7*  --  2.4  --  2.3  --   --   --  2.2  --   --   PHOS  --   --  2.0*  --  2.2*  --   --   --   --   --   --    < > = values in this interval not displayed.   GFR: Estimated Creatinine Clearance: 98.5 mL/min (by C-G formula based on SCr of 0.58 mg/dL). Liver Function Tests: Recent Labs  Lab 07/20/17 1330 07/20/17 1627 07/24/17 0457  AST 49* 41 25  ALT 31 26 15   ALKPHOS 100 86 56  BILITOT 1.7* 1.4* 0.5  PROT 6.6 5.8* 4.9*  ALBUMIN 2.9* 2.5* 2.1*   Recent Labs  Lab 07/20/17 1627  LIPASE 48   No results for input(s): AMMONIA in the last 168 hours. Coagulation Profile: Recent Labs  Lab 07/20/17 1330 07/20/17 1627  INR 0.98 1.06   Cardiac Enzymes: Recent Labs  Lab 07/20/17 1330 07/20/17 1627 07/20/17 2129 07/21/17 0108  CKTOTAL 276* 300*  --   --   CKMB  --  8.0*  --   --   TROPONINI  --  <0.03 <0.03 <0.03   BNP (last 3 results) No results for input(s): PROBNP in the last 8760 hours. HbA1C: No results for input(s): HGBA1C in the last 72 hours. CBG: Recent Labs  Lab 07/20/17 1603 07/21/17 1957  GLUCAP 161* 151*   Lipid Profile: No results for input(s): CHOL, HDL, LDLCALC, TRIG, CHOLHDL, LDLDIRECT in the last 72 hours. Thyroid Function Tests: No  results for input(s): TSH, T4TOTAL, FREET4, T3FREE, THYROIDAB in the last 72 hours. Anemia Panel: No results for input(s): VITAMINB12, FOLATE, FERRITIN, TIBC, IRON, RETICCTPCT in the last 72 hours. Urine analysis:    Component Value Date/Time   COLORURINE AMBER (A) 07/20/2017 1431   APPEARANCEUR HAZY (A) 07/20/2017 1431   LABSPEC 1.026 07/20/2017 1431   PHURINE 5.0 07/20/2017 1431   GLUCOSEU NEGATIVE 07/20/2017 1431   HGBUR SMALL (A) 07/20/2017 1431   BILIRUBINUR SMALL (A) 07/20/2017 1431   KETONESUR 5 (A) 07/20/2017 1431   PROTEINUR 100 (A) 07/20/2017 1431   UROBILINOGEN 1.0 02/26/2013 1916   NITRITE NEGATIVE 07/20/2017 1431   LEUKOCYTESUR NEGATIVE 07/20/2017 1431   Sepsis Labs: @LABRCNTIP (procalcitonin:4,lacticidven:4)  ) Recent Results (from the past 240 hour(s))  Urine culture     Status: None   Collection Time: 07/20/17  2:31 PM  Result Value Ref Range Status   Specimen Description URINE, CATHETERIZED  Final   Special Requests NONE  Final   Culture NO GROWTH  Final   Report Status 07/21/2017 FINAL  Final  MRSA PCR Screening     Status: None   Collection Time: 07/20/17  4:14 PM  Result Value Ref Range Status   MRSA by PCR NEGATIVE NEGATIVE Final    Comment:        The GeneXpert MRSA Assay (FDA approved for NASAL specimens only), is one component of a comprehensive MRSA colonization surveillance program. It is not intended to diagnose MRSA infection nor to guide or monitor treatment for MRSA infections.   Culture, blood (routine x 2)     Status: None   Collection Time: 07/20/17  4:30 PM  Result Value Ref Range Status   Specimen Description BLOOD LEFT ANTECUBITAL  Final   Special Requests   Final  BOTTLES DRAWN AEROBIC AND ANAEROBIC Blood Culture adequate volume   Culture NO GROWTH 5 DAYS  Final   Report Status 07/25/2017 FINAL  Final  Culture, blood (routine x 2)     Status: None   Collection Time: 07/20/17  4:45 PM  Result Value Ref Range Status    Specimen Description BLOOD BLOOD LEFT FOREARM  Final   Special Requests   Final    BOTTLES DRAWN AEROBIC AND ANAEROBIC Blood Culture adequate volume   Culture NO GROWTH 5 DAYS  Final   Report Status 07/25/2017 FINAL  Final  C difficile quick scan w PCR reflex     Status: None   Collection Time: 07/20/17  4:48 PM  Result Value Ref Range Status   C Diff antigen NEGATIVE NEGATIVE Final   C Diff toxin NEGATIVE NEGATIVE Final   C Diff interpretation No C. difficile detected.  Final      Studies: No results found.  Scheduled Meds: . DULoxetine  30 mg Oral Daily  . folic acid  1 mg Oral Daily  . gabapentin  300 mg Oral TID  . Gerhardt's butt cream   Topical QID  . levothyroxine  50 mcg Oral QAC breakfast  . metoprolol tartrate  25 mg Oral BID  . pantoprazole  40 mg Oral BID  . thiamine  100 mg Oral Daily    Continuous Infusions: . sodium chloride    . piperacillin-tazobactam (ZOSYN)  IV Stopped (07/25/17 0944)     LOS: 5 days     Laverna Peace, MD [facility] Pager [phone]  If 7PM-7AM, please contact night-coverage [email or web address] Password [email or web address] 07/25/2017, 12:03 PM

## 2017-07-25 NOTE — Plan of Care (Addendum)
Rectal tube was leaking stool, peri care provided x2 for incontinent stool. Flushed rectal tube with 60ml of water, and deflated/reinflated balloon with 40ml of Ns. Patient tolerated well.Pericare provided, exoriation to buttocks, remains red and tender to touch. Applied cream as ordered. Peeling scaly skin noted to sacrum and lower back, scant bleeding noted to buttocks. Foley care provide, some sediment noted in foley tubing and yeast like discharge at insertion site of foley. Cleaned site. Patient denies discomfort with foley at this time.   Patient received iv dilaudid prior to the first pericare session, but able to tolerate second session without pain medicine. Patient turns self well in bed and encouraged to rest on side to aid in healing of wound.   Patient continues to refuse wearing scds, educated on doing ankle pumps.

## 2017-07-25 NOTE — Progress Notes (Signed)
Pt flexiseal leaking again, MD paged to see if we can d/c flexiseal and try BSC since patient having more solid food and stool becoming more solid.  Will continue to monitor.

## 2017-07-26 LAB — BASIC METABOLIC PANEL
ANION GAP: 8 (ref 5–15)
BUN: <5 mg/dL — ABNORMAL LOW (ref 6–20)
CALCIUM: 8 mg/dL — AB (ref 8.9–10.3)
CO2: 26 mmol/L (ref 22–32)
CREATININE: 0.64 mg/dL (ref 0.44–1.00)
Chloride: 100 mmol/L — ABNORMAL LOW (ref 101–111)
GLUCOSE: 169 mg/dL — AB (ref 65–99)
Potassium: 2.8 mmol/L — ABNORMAL LOW (ref 3.5–5.1)
Sodium: 134 mmol/L — ABNORMAL LOW (ref 135–145)

## 2017-07-26 LAB — MAGNESIUM: Magnesium: 1.8 mg/dL (ref 1.7–2.4)

## 2017-07-26 MED ORDER — POTASSIUM CHLORIDE CRYS ER 20 MEQ PO TBCR
40.0000 meq | EXTENDED_RELEASE_TABLET | ORAL | Status: AC
  Administered 2017-07-26: 40 meq via ORAL

## 2017-07-26 MED ORDER — POTASSIUM CHLORIDE CRYS ER 20 MEQ PO TBCR
30.0000 meq | EXTENDED_RELEASE_TABLET | Freq: Two times a day (BID) | ORAL | Status: DC
  Filled 2017-07-26: qty 1

## 2017-07-26 NOTE — Care Management Note (Signed)
Case Management Note  Patient Details  Name: Julie Conley MRN: [id number] Date of Birth: 1975-12-26  Subjective/Objective:          AMS          Action/Plan: Plan is to d/c to home with home health services [facility]  Expected Discharge Date:    07/27/2017           Expected Discharge Plan:  Home/Self Care  In-House Referral:  Clinical Social Work  Discharge planning Services  CM Consult  Post Acute Care Choice:    Choice offered to:  Patient  DME Arranged:  3-N-1(charity case) DME Agency:  [facility]  HH Arranged[facility]  [facility]  Status of Service:  Completed, signed off  If discussed at [employer] Meetings, dates discussed:    Additional Comments:  Epifanio LeschesCole, Yalanda Soderman Hudson, RN 07/26/2017, 4:14 PM

## 2017-07-26 NOTE — Plan of Care (Signed)
  Progressing Activity: Risk for activity intolerance will decrease 07/26/2017 0456 - Progressing by Burtis Junesrewery, Valjean Ruppel, RN Note Patient stood at bedside overnight for about 5 minutes for pericare.  Patient used walker to stand. Pain Managment: General experience of comfort will improve 07/26/2017 0456 - Progressing by Burtis Junesrewery, Tijana Walder, RN Note Pain managed with percocet prn Lidocaine and cream applied to buttocks to ease burning pain after incontinence. Skin Integrity: Risk for impaired skin integrity will decrease 07/26/2017 0456 - Progressing by Burtis Junesrewery, Hortense Cantrall, RN Note Patient has become more tolerable of incontinent care able to turn/lift self. Buttock area remains open, creams applied after incontinent care Flexiseal was found out of rectum in pool of soft feces, patient refused for care nurse to replace, prefers to leave out until speaks to MD again.

## 2017-07-26 NOTE — Progress Notes (Signed)
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  She required intubation for one day for airway protection. Pt extubated on 11/14. Hx of sepsis, Hypovolemic Hyponatremia, alcohol abuse, and depression.    PT Comments    Pt showing progress towards goals this session, requiring less assist with all mobilities and ambulating in room for 12 ft. Ambulation distance currently limited by pain and dizziness. Pt reported she will be going home, not SNF at d/c. Based on pt improvement, this seems appropriate. Updated equipment recommendations and discussed new plan with evaluating PT. Plan on stair training next session.   Follow Up Recommendations  Home health PT     Equipment Recommendations  3in1 (PT)    Recommendations for Other Services       Precautions / Restrictions Precautions Precautions: Fall Restrictions Weight Bearing Restrictions: No    Mobility  Bed Mobility Overal bed mobility: Needs Assistance Bed Mobility: Supine to Sit;Sit to Supine     Supine to sit: Min guard Sit to supine: Min assist   General bed mobility comments: Pt able to move EOB with no assist, she required increased time with HOB elevated, and use of bed rails. Min A for LE management to return to supine.  Transfers Overall transfer level: Needs assistance Equipment used: Rolling walker (2 wheeled) Transfers: Sit to/from Stand Sit to Stand: Min guard         General transfer comment: min guard to rise into standing for pt safety.  Ambulation/Gait Ambulation/Gait assistance: Min guard Ambulation Distance (Feet): 12 Feet Assistive device: Rolling walker (2 wheeled) Gait Pattern/deviations: Step-through pattern;Trunk flexed Gait velocity: decreased Gait velocity  interpretation: Below normal speed for age/gender General Gait Details: slow guarded gait secondary to pain   Stairs            Wheelchair Mobility    Modified Rankin (Stroke Patients Only)       Balance Overall balance assessment: Needs assistance;History of Falls Sitting-balance support: Feet supported;Bilateral upper extremity supported Sitting balance-Leahy Scale: Poor Sitting balance - Comments: needed UE support   Standing balance support: Bilateral upper extremity supported;During functional activity Standing balance-Leahy Scale: Poor Standing balance comment: reliant on UE support                            Cognition Arousal/Alertness: Awake/alert Behavior During Therapy: WFL for tasks assessed/performed;Agitated Overall Cognitive Status: Within Functional Limits for tasks assessed                                 General Comments: Slightly aggitated that therapy arrived while she was eating lunch, willing to participate with encouragement      Exercises Other Exercises Other Exercises: bridge;strengthening;5x;supine    General Comments        Pertinent Vitals/Pain Pain Assessment: 0-10 Pain Score: 9  Pain Location: buttocks Pain Descriptors / Indicators: Aching;Grimacing;Guarding;Sharp Pain Intervention(s): Monitored during session;Limited activity within patient's tolerance    Home Living                      Prior Function            PT Goals (current goals can now be  found in the care plan section) Acute Rehab PT Goals Patient Stated Goal: to go home PT Goal Formulation: With patient Time For Goal Achievement: 08/06/17 Potential to Achieve Goals: Good Progress towards PT goals: Progressing toward goals    Frequency    Min 3X/week      PT Plan Discharge plan needs to be updated;Equipment recommendations need to be updated    Co-evaluation              AM-PAC PT "6 Clicks" Daily Activity   Outcome Measure  Difficulty turning over in bed (including adjusting bedclothes, sheets and blankets)?: Unable Difficulty moving from lying on back to sitting on the side of the bed? : Unable Difficulty sitting down on and standing up from a chair with arms (e.g., wheelchair, bedside commode, etc,.)?: Unable Help needed moving to and from a bed to chair (including a wheelchair)?: A Little Help needed walking in hospital room?: A Little Help needed climbing 3-5 steps with a railing? : A Lot 6 Click Score: 11    End of Session   Activity Tolerance: Patient limited by pain;Patient tolerated treatment well Patient left: in bed;with call bell/phone within reach Nurse Communication: Mobility status PT Visit Diagnosis: Muscle weakness (generalized) (M62.81);Pain Pain - part of body: (buttocks)     Time: [id number] PT Time Calculation (min) (ACUTE ONLY): 11 min  Charges:  $Therapeutic Activity: 8-22 mins                    G Codes:       Kallie LocksHannah Charlotta Lapaglia, [place] Pager [phone] Acute Rehab   Sheral ApleyHannah E Lidwina Kaner 07/26/2017, 2:20 PM

## 2017-07-26 NOTE — Progress Notes (Signed)
Incontinent care provided due to stool leaking from flexiseal, deflated balloon and reinserted device, continues to leak slightly. Creams applied to peri area and buttocks.

## 2017-07-26 NOTE — Consult Note (Signed)
WOC Nurse wound follow up Wound type: Severe MASD from urinary and fecal incontinence. Today this is improving but continues to have partial thickness skin loss over bilateral buttocks. Perineum improved.  Stage 2 Pressure injury along her lower abdomen is healing. No dressing needed any longer Did not assess left elbow today  Dressing procedure/placement/frequency: No topical care needed for the lower abdominal area Continue Gerhardts butt cream to the buttocks and perineum  Education provided on care for the area at home, she can use OTC diaper rash cream with zinc and hydrocortisone    WOC Nurse team will follow along with you for weekly wound assessments.  Please notify me of any acute changes in the wounds or any new areas of concerns Smita Lesh [facility] MSN, RN,CWOCN, CNS, CWON-AP [phone]

## 2017-07-26 NOTE — Progress Notes (Signed)
PROGRESS NOTE  Mava Suares UXL:[id number] DOB: Mar 23, 1976 DOA: 07/20/2017 PCP: Patient, No Pcp Per  HPI/Recap of past 24 hours:  Chanin Frumkin is a 41 y.o. year old female with medical history significant for depression,  hypothyroidism, GERD, s/p roux en y gastric bypass, iron deficiency anemia, who presented on 07/20/2017 with with decreased responsiveness concerning for alcohol intoxication.  Patient's initial presentation, family found patient down with minimal responsiveness arrival to ED patient needed intubation for protection of airway.  On presentation, T-max of 102.2, tachypnea to 28, heart rate 130, blood pressure 165/146.  Ethanol level was less than 10.  Other lab work notable for sodium 112, osmolality 257, TSH 10.9, creatinine 1.42, CO2 16, potassium 5.3, lactic acid 2.84 Tylenol level less than 10, UDS negative.  UA was unremarkable on admission.  During time in ICU patient's hyponatremia and AK I improved  with normal saline fluid resuscitation.  Patient was treated with Vancomycin and Zosyn ( previously stated as unasyn incorrectly in my prior progress note) for possible aspiration pneumonia, of note chest x-ray on admission showed no concerning signs or opacities however given septic profile on presentation antibiotics were continued.  Overnight Flexi-Seal was discontinued.  This morning patient reports she is done drinking alcohol heavily.  Otherwise no acute events overnight   Assessment/Plan: Principal Problem:   Alcohol-induced mood disorder (HCC) Active Problems:   Hypothyroidism   Hyponatremia   Depression   History of gastric bypass   Acute encephalopathy   Pressure injury of skin   Alcohol abuse   GERD (gastroesophageal reflux disease)  Acute metabolic encephalopathy, multifactorial (hyponatremia, pneumonia, alcohol withdrawal,)-resolved  Acute respiratory failure, secondary to alcohol withdrawal and severe hyponatremia, resolved  Sepsis secondary to  stage II pressure ulcers perineum, buttocks, resolved Patient empirically treated with vancomycin and Zosyn for presumed aspiration pneumonia.  However on my review of chest x-ray no concerning signs of opacities additionally patient does not have any respiratory symptoms.  Based on physical exam only localizing area for potential infection would be stage II pressure injury to buttocks and perineum.  In addition to negative blood cultures patient has done well with Augmentin, and date 07/27/2017.   Hypovolemic Hyponatremia, resolved Likely multifactorial in setting of poor p.o. Intake, in addition to decreased solutes in patient with alcohol abuse.  He has improved greatly with discontinuation of IV fluids   Stage 2 Pressure Ulcer, present on admission Related to being found down for unknown amount of time.  Potential source of infection given patient presented in sepsis.  Blood cultures remain negative for any MRSA Doing well on oral Augmentin, and date 07/27/2017 Wound care nurse following will follow recommendations,    Alcohol abuse No symptoms of withdrawal currently.  Earlier in admission required Precedex drip during stay in ICU. We will continue to monitor on CIWA protocol Continue thiamine and folic acid supplementation Patient counseled on importance of alcohol cessation, patient declines inpatient rehab treatment for alcoholism   Depression From psych standpoint no concern for acute rehabilitation with inpatient admission.  Mood most likely associated with substance abuse Cymbalta 30 mg, gabapentin 300 mg TID psych recommended inpatient rehab treatment for alcoholism    Hypothyroidism TSH elevated greater than 10 on admission -Continue Synthroid 50 mcg (before breakfast), will need to reevaluate as an outpatient  Hypertension, controlled -Continue home metoprolol  GERD -home protonix  AKI, resolved Presumed prerenal etiology in the setting of poor p.o. intake, the  patient being found down for unknown amount of time.  Improved with fluid resuscitation   Code Status: Full Code   Family Communication: No family at bedside   Disposition Plan: [facility], will need to establish PCP   [facility]  [facility] Dr. Juanetta BeetsJacqueline Norman   Procedures:  *ETT 11/13 >> 11/14  EEG 11/13 >> generalized irregular slowing    Antimicrobials: Vancomycin11/13 >>11/13 Zosyn 11/13 >> 11/18 Augmetin 11/18>>    Cultures: Urine 11/13 >> negative  Sputum 11/13 >>negative Blood 11/13 >>negative   DVT prophylaxis: SCDs   Objective: Vitals:   07/25/17 2132 07/26/17 0509 07/26/17 1041 07/26/17 1516  BP: 130/69 129/69 139/71 124/63  Pulse: 79 98 83 84  Resp: 20 20  18   Temp: 98 F (36.7 C) 98 F (36.7 C)  97.9 F (36.6 C)  TempSrc: Oral Oral  Oral  SpO2: 98% 100%  97%  Weight:  91 kg (200 lb 9.9 oz)    Height:        Intake/Output Summary (Last 24 hours) at 07/26/2017 2130 Last data filed at 07/26/2017 0724 Gross per 24 hour  Intake 480 ml  Output 1601 ml  Net -1121 ml   Filed Weights   07/23/17 1300 07/25/17 1534 07/26/17 0509  Weight: 86.5 kg (190 lb 11.2 oz) 87 kg (191 lb 12.8 oz) 91 kg (200 lb 9.9 oz)    Exam:   General:  Lying in bed comfortably, in no distress  HEENT: Moist oral mucosa  Abdomen: soft, non-distended, non-tender   Skin: Denuded skin of bilateral buttocks and perineum, blanching erythema, no open wounds or drainage  Neurologic: alert and oriented to person, place, time, and context  Psychiatry: appropriate affect and mood    Data Reviewed: CBC: Recent Labs  Lab 07/20/17 1330  07/21/17 0522 07/22/17 0308 07/23/17 0500 07/24/17 1036 07/25/17 0727  WBC 8.9  --  7.5 9.4 10.9* 5.6 5.9  NEUTROABS 5.6  --   --   --   --   --   --   HGB 16.4*   < > 13.7 11.3* 10.3* 8.3* 8.8*  HCT 43.6   < > 36.9 31.4* 30.3* 24.1* 25.9*  MCV 85.0  --  86.6 86.7 90.2 91.3 91.8  PLT 178  --  131* 128* 150 157  177   < > = values in this interval not displayed.   Basic Metabolic Panel: Recent Labs  Lab 07/20/17 1330  07/20/17 1627  07/21/17 0522  07/22/17 0308 07/23/17 0500 07/24/17 0457 07/25/17 0727 07/26/17 0228  NA 112*   < > 115*   < >  --    < > 123* 125* 125* 132* 134*  K 5.9*   < > 4.9   < >  --    < > 4.4 3.6 3.7 3.2* 2.8*  CL 82*   < > 90*   < >  --    < > 98* 97* 94* 100* 100*  CO2 16*  --  10*   < >  --    < > 16* 20* 23 24 26   GLUCOSE 190*   < > 155*   < >  --    < > 110* 103* 95 148* 169*  BUN 37*   < > 34*   < >  --    < > 10 12 8  <5* <5*  CREATININE 1.42*   < > 1.01*   < >  --    < > 0.71 1.04* 0.85 0.58 0.64  CALCIUM 8.3*  --  7.8*   < >  --    < > 7.7* 8.2* 7.8* 7.8* 8.0*  MG 2.7*  --  2.4  --  2.3  --   --  2.2  --   --  1.8  PHOS  --   --  2.0*  --  2.2*  --   --   --   --   --   --    < > = values in this interval not displayed.   GFR: Estimated Creatinine Clearance: 101.1 mL/min (by C-G formula based on SCr of 0.64 mg/dL). Liver Function Tests: Recent Labs  Lab 07/20/17 1330 07/20/17 1627 07/24/17 0457  AST 49* 41 25  ALT 31 26 15   ALKPHOS 100 86 56  BILITOT 1.7* 1.4* 0.5  PROT 6.6 5.8* 4.9*  ALBUMIN 2.9* 2.5* 2.1*   Recent Labs  Lab 07/20/17 1627  LIPASE 48   No results for input(s): AMMONIA in the last 168 hours. Coagulation Profile: Recent Labs  Lab 07/20/17 1330 07/20/17 1627  INR 0.98 1.06   Cardiac Enzymes: Recent Labs  Lab 07/20/17 1330 07/20/17 1627 07/20/17 2129 07/21/17 0108  CKTOTAL 276* 300*  --   --   CKMB  --  8.0*  --   --   TROPONINI  --  <0.03 <0.03 <0.03   BNP (last 3 results) No results for input(s): PROBNP in the last 8760 hours. HbA1C: No results for input(s): HGBA1C in the last 72 hours. CBG: Recent Labs  Lab 07/20/17 1603 07/21/17 1957  GLUCAP 161* 151*   Lipid Profile: No results for input(s): CHOL, HDL, LDLCALC, TRIG, CHOLHDL, LDLDIRECT in the last 72 hours. Thyroid Function Tests: No results for  input(s): TSH, T4TOTAL, FREET4, T3FREE, THYROIDAB in the last 72 hours. Anemia Panel: No results for input(s): VITAMINB12, FOLATE, FERRITIN, TIBC, IRON, RETICCTPCT in the last 72 hours. Urine analysis:    Component Value Date/Time   COLORURINE AMBER (A) 07/20/2017 1431   APPEARANCEUR HAZY (A) 07/20/2017 1431   LABSPEC 1.026 07/20/2017 1431   PHURINE 5.0 07/20/2017 1431   GLUCOSEU NEGATIVE 07/20/2017 1431   HGBUR SMALL (A) 07/20/2017 1431   BILIRUBINUR SMALL (A) 07/20/2017 1431   KETONESUR 5 (A) 07/20/2017 1431   PROTEINUR 100 (A) 07/20/2017 1431   UROBILINOGEN 1.0 02/26/2013 1916   NITRITE NEGATIVE 07/20/2017 1431   LEUKOCYTESUR NEGATIVE 07/20/2017 1431   Sepsis Labs: @LABRCNTIP (procalcitonin:4,lacticidven:4)  ) Recent Results (from the past 240 hour(s))  Urine culture     Status: None   Collection Time: 07/20/17  2:31 PM  Result Value Ref Range Status   Specimen Description URINE, CATHETERIZED  Final   Special Requests NONE  Final   Culture NO GROWTH  Final   Report Status 07/21/2017 FINAL  Final  MRSA PCR Screening     Status: None   Collection Time: 07/20/17  4:14 PM  Result Value Ref Range Status   MRSA by PCR NEGATIVE NEGATIVE Final    Comment:        The GeneXpert MRSA Assay (FDA approved for NASAL specimens only), is one component of a comprehensive MRSA colonization surveillance program. It is not intended to diagnose MRSA infection nor to guide or monitor treatment for MRSA infections.   Culture, blood (routine x 2)     Status: None   Collection Time: 07/20/17  4:30 PM  Result Value Ref Range Status   Specimen Description BLOOD LEFT ANTECUBITAL  Final   Special Requests   Final  BOTTLES DRAWN AEROBIC AND ANAEROBIC Blood Culture adequate volume   Culture NO GROWTH 5 DAYS  Final   Report Status 07/25/2017 FINAL  Final  Culture, blood (routine x 2)     Status: None   Collection Time: 07/20/17  4:45 PM  Result Value Ref Range Status   Specimen  Description BLOOD BLOOD LEFT FOREARM  Final   Special Requests   Final    BOTTLES DRAWN AEROBIC AND ANAEROBIC Blood Culture adequate volume   Culture NO GROWTH 5 DAYS  Final   Report Status 07/25/2017 FINAL  Final  C difficile quick scan w PCR reflex     Status: None   Collection Time: 07/20/17  4:48 PM  Result Value Ref Range Status   C Diff antigen NEGATIVE NEGATIVE Final   C Diff toxin NEGATIVE NEGATIVE Final   C Diff interpretation No C. difficile detected.  Final      Studies: No results found.  Scheduled Meds: . amoxicillin-clavulanate  1 tablet Oral Q12H  . DULoxetine  30 mg Oral Daily  . folic acid  1 mg Oral Daily  . gabapentin  300 mg Oral TID  . Gerhardt's butt cream   Topical QID  . levothyroxine  50 mcg Oral QAC breakfast  . metoprolol tartrate  25 mg Oral BID  . pantoprazole  40 mg Oral BID  . thiamine  100 mg Oral Daily    Continuous Infusions: . sodium chloride       LOS: 6 days     Laverna PeaceShayla D Nettey, MD [facility] Pager [phone]  If 7PM-7AM, please contact night-coverage [email or web address] Password [email or web address] 07/26/2017, 9:30 PM

## 2017-07-26 NOTE — Progress Notes (Signed)
Patient states "I feel like I have an UTI" symptoms of burning and discomfort from urinary catheter. Patient also complained she would like the flexiseal removed because she continues to leak and feels she is now able to use a bedside commode. Notified team of concerns, MD recommends to keep both lines in place at this time.

## 2017-07-27 LAB — BASIC METABOLIC PANEL
ANION GAP: 9 (ref 5–15)
BUN: 8 mg/dL (ref 6–20)
CHLORIDE: 96 mmol/L — AB (ref 101–111)
CO2: 28 mmol/L (ref 22–32)
Calcium: 7.9 mg/dL — ABNORMAL LOW (ref 8.9–10.3)
Creatinine, Ser: 0.52 mg/dL (ref 0.44–1.00)
GFR calc Af Amer: >60 mL/min (ref >60)
GFR calc non Af Amer: >60 mL/min (ref >60)
GLUCOSE: 116 mg/dL — AB (ref 65–99)
POTASSIUM: 3.8 mmol/L (ref 3.5–5.1)
Sodium: 133 mmol/L — ABNORMAL LOW (ref 135–145)

## 2017-07-27 MED ORDER — GERHARDT'S BUTT CREAM: 1.0000 "application " | TOPICAL_CREAM | Freq: Four times a day (QID) | CUTANEOUS | 0 refills | Status: DC

## 2017-07-27 MED ORDER — DULOXETINE HCL 30 MG PO CPEP: 30.0000 mg | ORAL_CAPSULE | Freq: Every day | ORAL | 0 refills | Status: DC

## 2017-07-27 MED ORDER — METOPROLOL TARTRATE 25 MG PO TABS: 25.0000 mg | ORAL_TABLET | Freq: Two times a day (BID) | ORAL | 0 refills | Status: DC

## 2017-07-27 MED ORDER — THIAMINE HCL 100 MG PO TABS: 100.0000 mg | ORAL_TABLET | Freq: Every day | ORAL | 0 refills | Status: DC

## 2017-07-27 MED ORDER — GABAPENTIN 300 MG PO CAPS: 300.0000 mg | ORAL_CAPSULE | Freq: Three times a day (TID) | ORAL | 0 refills | Status: DC

## 2017-07-27 MED ORDER — PANTOPRAZOLE SODIUM 40 MG PO TBEC: 40.0000 mg | DELAYED_RELEASE_TABLET | Freq: Every day | ORAL | 0 refills | Status: DC

## 2017-07-27 MED ORDER — LEVOTHYROXINE SODIUM 50 MCG PO TABS: 50.0000 ug | ORAL_TABLET | Freq: Every day | ORAL | 0 refills | Status: DC

## 2017-07-27 MED ORDER — FOLIC ACID 1 MG PO TABS: 1.0000 mg | ORAL_TABLET | Freq: Every day | ORAL | 0 refills | Status: DC

## 2017-07-27 MED ORDER — AMOXICILLIN-POT CLAVULANATE 875-125 MG PO TABS: 1.0000 | ORAL_TABLET | Freq: Two times a day (BID) | ORAL | 0 refills | Status: AC

## 2017-07-27 NOTE — Progress Notes (Signed)
Julie Conley to be D/C'd Home per MD order.  Discussed with the patient and all questions fully answered.  VSS, Skin clean, dry and intact without evidence of skin break down, no evidence of skin tears noted. IV catheter discontinued intact. Site without signs and symptoms of complications. Dressing and pressure applied.  An After Visit Summary was printed and given to the patient. Patient received prescription.  D/c education completed with patient/family including follow up instructions, medication list, d/c activities limitations if indicated, with other d/c instructions as indicated by MD - patient able to verbalize understanding, all questions fully answered.   Patient instructed to return to ED, call 911, or call MD for any changes in condition.   Patient escorted via WC, and D/C home via private auto.  [name] O [name] 07/27/2017 2:48 PM

## 2017-07-27 NOTE — Discharge Summary (Signed)
Discharge Summary  Julie Conley ZOX:[id number] DOB: 26-Nov-1975  PCP: Patient, No Pcp Per  Admit date: 07/20/2017 Discharge date: 07/27/2017  Time spent: 25 minutes  Admitted From:Home Disposition:  Home  Recommendations for Outpatient Follow-up:  1. Follow up with PCP in 1-2 weeks.  TSH check in 6-8 weeks to determine Synthroid dosing 2. Please obtain BMP in one week to ensure stable sodium. 3. Medication Changes: Cymbalta 30 mg daily for depression/anxiety, gabapentin 300 mg 3 times daily for alcoholism, Augmentin ( one remaining dose), Butthardt cream, Synthroid 50 mcg  Home Health:Physical Therapy Equipment/Devices:  Discharge Diagnoses:  Active Hospital Problems   Diagnosis Date Noted  . 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 GERD (gastroesophageal reflux disease) 07/24/2017  . Alcohol abuse   . Pressure injury of skin 07/21/2017  . Acute encephalopathy 07/20/2017  . Depression 03/20/2016  . 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 Hyponatremia 01/20/2016  . Hypothyroidism 11/20/2011    Resolved Hospital Problems  No resolved problems to display.    Discharge Condition: Stable  CODE STATUS:FULL Diet recommendation: Heart Healthy   Vitals:   07/27/17 0525 07/27/17 0800  BP: (!) 141/83 (!) 147/71  Pulse: 83 61  Resp: 18   Temp: 98.3 F (36.8 C)   SpO2: 100%     History of present illness:   Julie Conley is a 41 y.o. year old female with medical history significant for depression,  hypothyroidism, GERD, s/p roux en y gastric bypass, iron deficiency anemia, who presented on 07/20/2017 with with decreased responsiveness concerning for alcohol withdrawal and severe hyponatremia. Patient's initial presentation, family found patient down with minimal responsiveness arrival to ED patient needed intubation for protection of airway.    Hospital Course:  Principal Problem:   Alcohol-induced mood disorder (HCC) Active Problems:   Hypothyroidism   Hyponatremia   Depression   History of gastric bypass   Acute encephalopathy   Pressure injury of skin   Alcohol abuse   GERD (gastroesophageal reflux disease)   Acute metabolic encephalopathy, multifactorial (severe hyponatremia, alcohol withdrawal, sepsis secondary to infected pressure injuries) Acute respiratory failure, secondary to alcohol withdrawal and severe hyponatremia, resolved On presentation, T-max of 102.2, tachypnea to 28, heart rate 130, blood pressure 165/146. Patient's initial presentation, family found patient down with minimal responsiveness arrival to ED patient needed intubation for protection of airway.  On presentation, T-max of 102.2, tachypnea to 28, heart rate 130, blood pressure 165/146.  Ethanol level was less than 10.  Other lab work notable for sodium 112, osmolality 257, TSH 10.9, creatinine 1.42, CO2 16, potassium 5.3, lactic acid 2.84 Tylenol level less than 10, UDS negative.  UA was unremarkable on admission.  During time in ICU patient's hyponatremia and AKI improved  with normal saline fluid resuscitation.  Patient was treated with Vancomycin and Zosyn ( previously stated as unasyn incorrectly in my prior progress note) for possible aspiration pneumonia, of note chest x-ray on admission showed no concerning signs or opacities however given septic profile on presentation antibiotics were continued.  Patient's mental status returned to baseline with supportive therapy as mentioned above.  Hypovolemic hyponatremia, symptomatic, severe, resolved Presumed multifactorial in setting of chronic alcohol abuse (poor solute) decreased p.o. intake after being found down for unknown period of time.  Patient on admission sodium of 117 that improved slowly with normal saline fluid resuscitation.  Patient did not require hypertonic saline during hospitalization.  Sodium 133 on discharge.  Alcohol withdrawal and history of alcohol abuse Depression anxiety Alcohol  level less  than 10 on admisison.  Given decreased responsiveness concerned the alcohol withdrawal 120 factor to presentation.  While intubated in ICU patient required Precedex drip.  Once extubated patient did not trigger CIWA protocol.  Psych was consulted.Patient admitted to drinking daily for the prior 2 weeks due to family conflicts.  Patient denied any suicidal or homicidal ideation.  Psych believe her current mood problems were attributed to her substance abuse.  They highly recommended patient completing rehab for alcohol abuse however patient declined.  She did not meet criteria for psychiatric inpatient admission during this hospitalization.  Per psychiatric recommendations the following medication changes were made: Cymbalta 30 mg daily for depression/anxiety, gabapentin 300 mg 3 times daily for alcoholism.  Prior to discharge patient was counseled extensively on importance of alcohol cessation and continued on oral supplementation with folic acid and thiamine..   Sepsis secondary to stage II pressure ulcers of perineum, buttocks, resolved  Patient empirically treated with vancomycin and Zosyn for presumed aspiration pneumonia.  However on my review of chest x-ray no concerning signs of opacities additionally patient does not have any respiratory symptoms.  Based on physical exam only localizing area for potential infection would be stage II pressure injury to buttocks and perineum.  In addition to negative blood cultures patient has done well with Augmentin, end date 07/27/2017.  Hypothyroidism Admission TSH greater than 10.  Patient started on Synthroid 50 mcg.  Will need follow-up as outpatient with repeat TSH in 6-8 weeks to determine any changes in Synthroid dosing   Acute kidney injury, prerenal etiology Patient's creatinine improved with supportive care Creatinine peak of 1.46 on initial admission.  Likely in the setting of poor p.o. intake after being found down for unknown period of time.  With  IV fluids resuscitation and encouraged hydration once extubated.  Patient Creatinine returned to baseline prior to discharge.  Creatinine 0.64 on discharge.   Consultations:  Psychiatry   Procedures/Studies:  Endotracheal tube: 11/13 >>11/14  EEG 11/13 >> generalized irregular slowing     Ct Head Wo Contrast  Result Date: 07/20/2017 CLINICAL DATA:  Altered mental status. EXAM: CT HEAD WITHOUT CONTRAST TECHNIQUE: Contiguous axial images were obtained from the base of the skull through the vertex without intravenous contrast. COMPARISON:  CT head report dated February 27, 2013. FINDINGS: Brain: No evidence of acute infarction, hemorrhage, hydrocephalus, extra-axial collection or mass lesion/mass effect. Prominent perivascular space in the right inferior basal ganglia. Vascular: No hyperdense vessel or unexpected calcification. Skull: Normal. Negative for fracture or focal lesion. Sinuses/Orbits: No acute finding. Other: Partially visualized endotracheal and enteric tubes in the oropharynx. IMPRESSION: 1.  No acute intracranial abnormality. Electronically Signed   By: Obie Dredge M.D.   On: 07/20/2017 15:55   Dg Chest Port 1 View  Result Date: 07/24/2017 CLINICAL DATA:  Acute respiratory failure with hypoxia EXAM: PORTABLE CHEST 1 VIEW COMPARISON:  07/23/2017 FINDINGS: Lungs are clear. Heart is normal size. No effusions or acute bony abnormality. IMPRESSION: No active disease. Electronically Signed   By: Charlett Nose M.D.   On: 07/24/2017 08:04   Dg Chest Port 1 View  Result Date: 07/23/2017 CLINICAL DATA:  Atelectasis EXAM: PORTABLE CHEST 1 VIEW COMPARISON:  July 22, 2017 FINDINGS: Slight atelectasis in the left base remains. Lungs elsewhere clear. Heart size and pulmonary vascularity are normal. No adenopathy. No bone lesions. IMPRESSION: Slight left base atelectasis. Lungs elsewhere clear. Cardiac silhouette within normal limits. Electronically Signed   By: Bretta Bang III  M.D.   On: 07/23/2017 07:15   Dg Chest Port 1 View  Result Date: 07/22/2017 CLINICAL DATA:  Respiratory failure EXAM: PORTABLE CHEST 1 VIEW COMPARISON:  Two days ago FINDINGS: Tracheal and esophageal extubation. Mild retrocardiac atelectasis, otherwise well inflated. Normal heart size and mediastinal contours. IMPRESSION: Mild retrocardiac atelectasis. Electronically Signed   By: Marnee Spring M.D.   On: 07/22/2017 07:11   Dg Chest Port 1 View  Result Date: 07/20/2017 CLINICAL DATA:  Check endotracheal tube placement EXAM: PORTABLE CHEST 1 VIEW COMPARISON:  None. FINDINGS: Cardiac shadow is within normal limits. Endotracheal tube is noted in satisfactory position. Nasogastric catheter is noted within the stomach. The lungs are clear bilaterally. No acute bony abnormality is seen. IMPRESSION: No acute abnormality noted. Tubes and lines as described. Electronically Signed   By: Alcide Clever M.D.   On: 07/20/2017 14:38   Dg Abd Portable 1v  Result Date: 07/20/2017 CLINICAL DATA:  OG tube placement EXAM: PORTABLE ABDOMEN - 1 VIEW COMPARISON:  07/20/2017 FINDINGS: Esophageal tube tip and side-port project over the proximal stomach. Surgical clips in the right and left upper quadrants. Postsurgical suture in the left hemiabdomen. Nonspecific decreased small bowel gas. IMPRESSION: Enteric tube tip projects over the proximal stomach Electronically Signed   By: Jasmine Pang M.D.   On: 07/20/2017 22:03   Dg Abd Portable 1v  Result Date: 07/20/2017 CLINICAL DATA:  Complaint associated with the gastric tube. EXAM: PORTABLE ABDOMEN - 1 VIEW COMPARISON:  03/20/16 FINDINGS: The enteric tube is identified with tip in the proximal stomach. The side port may be above the level of the GE junction. There is mild gaseous distension of the colon which, in the postoperative time frame may represent colonic ileus. IMPRESSION: 1. Tip of the enteric tube projects over the left upper quadrant of the abdomen in the  expected location of the proximal stomach. The side port for the enteric tube may be at or just above the level of the GE junction. 2. Findings compatible with mild colonic ileus. Electronically Signed   By: Signa Kell M.D.   On: 07/20/2017 20:11    (Echo, Carotid, EGD, Colonoscopy, ERCP)   Discharge Exam: BP (!) 147/71   Pulse 61   Temp 98.3 F (36.8 C) (Oral)   Resp 18   Ht  (1.626 m)   Wt 94.1 kg (207 lb 6.4 oz)   SpO2 100%   BMI 35.60 kg/m   General: Lying in bed comfortably, in no apparent distress Cardiovascular: Regular rate and rhythm, no murmurs rubs or gallops Respiratory: Clear breath sounds bilaterally, no accessory muscle use, on room air Abdominal: Soft, nondistended, nontender, normoactive bowel sounds Skin: Blanchable erythematous area including bilateral buttocks and perineum.  Slightly tender to palpation, no open lesions or draining present.  Discharge Instructions You were cared for by a hospitalist during your hospital stay. If you have any questions about your discharge medications or the care you received while you were in the hospital after you are discharged, you can call the unit and asked to speak with the hospitalist on call if the hospitalist that took care of you is not available. Once you are discharged, your primary care physician will handle any further medical issues. Please note that NO REFILLS for any discharge medications will be authorized once you are discharged, as it is imperative that you return to your primary care physician (or establish a relationship with a primary care physician if you do not have one) for  your aftercare needs so that they can reassess your need for medications and monitor your lab values.  Discharge Instructions    Diet - low sodium heart healthy   Complete by:  As directed    Increase activity slowly   Complete by:  As directed      Allergies as of 07/27/2017      Reactions   Adhesive [tape] Other (See  Comments)   TEARS SKIN   Ambien [zolpidem Tartrate] Other (See Comments)   Delirium, disorientation   Latex Rash   Sulfa Antibiotics Itching      Medication List    TAKE these medications   amoxicillin-clavulanate 875-125 MG tablet Commonly known as:  AUGMENTIN Take 1 tablet by mouth every 12 (twelve) hours for 1 dose.   DULoxetine 30 MG capsule Commonly known as:  CYMBALTA Take 1 capsule (30 mg total) by mouth daily.   folic acid 1 MG tablet Commonly known as:  FOLVITE Take 1 tablet (1 mg total) by mouth daily.   gabapentin 300 MG capsule Commonly known as:  NEURONTIN Take 1 capsule (300 mg total) by mouth 3 (three) times daily.   Gerhardt's butt cream Crea Apply 1 application topically 4 (four) times daily.   levothyroxine 50 MCG tablet Commonly known as:  SYNTHROID, LEVOTHROID Take 1 tablet (50 mcg total) by mouth daily before breakfast. Start taking on:  07/28/2017   metoprolol tartrate 25 MG tablet Commonly known as:  LOPRESSOR Take 1 tablet (25 mg total) by mouth 2 (two) times daily.   pantoprazole 40 MG tablet Commonly known as:  PROTONIX Take 1 tablet (40 mg total) by mouth daily.   thiamine 100 MG tablet Take 1 tablet (100 mg total) by mouth daily.            Durable Medical Equipment  (From admission, onward)        Start     Ordered   07/26/17 1609  For home use only DME 3 n 1  Once     07/26/17 1608     Allergies  Allergen Reactions  . Adhesive [Tape] Other (See Comments)    TEARS SKIN  . Ambien [Zolpidem Tartrate] Other (See Comments)    Delirium, disorientation  . Latex Rash  . Sulfa Antibiotics Itching   Follow-up Information    [facility] Follow up on 08/04/2017.   Why:  at 9:30am Contact information: [address] [place] [phone]       [facility] - Dme Follow up.   Contact information: [address] [place] [address] [phone]        Health,  [facility]-Home Follow up.   Specialty:  Home Health Services Contact information: [address] [place] [address] [phone]            The results of significant diagnostics from this hospitalization (including imaging, microbiology, ancillary and laboratory) are listed below for reference.    Significant Diagnostic Studies: Ct Head Wo Contrast  Result Date: 07/20/2017 CLINICAL DATA:  Altered mental status. EXAM: CT HEAD WITHOUT CONTRAST TECHNIQUE: Contiguous axial images were obtained from the base of the skull through the vertex without intravenous contrast. COMPARISON:  CT head report dated February 27, 2013. FINDINGS: Brain: No evidence of acute infarction, hemorrhage, hydrocephalus, extra-axial collection or mass lesion/mass effect. Prominent perivascular space in the right inferior basal ganglia. Vascular: No hyperdense vessel or unexpected calcification. Skull: Normal. Negative for fracture or  focal lesion. Sinuses/Orbits: No acute finding. Other: Partially visualized endotracheal and enteric tubes in the oropharynx. IMPRESSION: 1.  No acute intracranial abnormality. Electronically Signed   By: Obie Dredge M.D.   On: 07/20/2017 15:55   Dg Chest Port 1 View  Result Date: 07/24/2017 CLINICAL DATA:  Acute respiratory failure with hypoxia EXAM: PORTABLE CHEST 1 VIEW COMPARISON:  07/23/2017 FINDINGS: Lungs are clear. Heart is normal size. No effusions or acute bony abnormality. IMPRESSION: No active disease. Electronically Signed   By: Charlett Nose M.D.   On: 07/24/2017 08:04   Dg Chest Port 1 View  Result Date: 07/23/2017 CLINICAL DATA:  Atelectasis EXAM: PORTABLE CHEST 1 VIEW COMPARISON:  July 22, 2017 FINDINGS: Slight atelectasis in the left base remains. Lungs elsewhere clear. Heart size and pulmonary vascularity are normal. No adenopathy. No bone lesions. IMPRESSION: Slight left base atelectasis. Lungs elsewhere clear. Cardiac silhouette within normal  limits. Electronically Signed   By: Bretta Bang III M.D.   On: 07/23/2017 07:15   Dg Chest Port 1 View  Result Date: 07/22/2017 CLINICAL DATA:  Respiratory failure EXAM: PORTABLE CHEST 1 VIEW COMPARISON:  Two days ago FINDINGS: Tracheal and esophageal extubation. Mild retrocardiac atelectasis, otherwise well inflated. Normal heart size and mediastinal contours. IMPRESSION: Mild retrocardiac atelectasis. Electronically Signed   By: Marnee Spring M.D.   On: 07/22/2017 07:11   Dg Chest Port 1 View  Result Date: 07/20/2017 CLINICAL DATA:  Check endotracheal tube placement EXAM: PORTABLE CHEST 1 VIEW COMPARISON:  None. FINDINGS: Cardiac shadow is within normal limits. Endotracheal tube is noted in satisfactory position. Nasogastric catheter is noted within the stomach. The lungs are clear bilaterally. No acute bony abnormality is seen. IMPRESSION: No acute abnormality noted. Tubes and lines as described. Electronically Signed   By: Alcide Clever M.D.   On: 07/20/2017 14:38   Dg Abd Portable 1v  Result Date: 07/20/2017 CLINICAL DATA:  OG tube placement EXAM: PORTABLE ABDOMEN - 1 VIEW COMPARISON:  07/20/2017 FINDINGS: Esophageal tube tip and side-port project over the proximal stomach. Surgical clips in the right and left upper quadrants. Postsurgical suture in the left hemiabdomen. Nonspecific decreased small bowel gas. IMPRESSION: Enteric tube tip projects over the proximal stomach Electronically Signed   By: Jasmine Pang M.D.   On: 07/20/2017 22:03   Dg Abd Portable 1v  Result Date: 07/20/2017 CLINICAL DATA:  Complaint associated with the gastric tube. EXAM: PORTABLE ABDOMEN - 1 VIEW COMPARISON:  03/20/16 FINDINGS: The enteric tube is identified with tip in the proximal stomach. The side port may be above the level of the GE junction. There is mild gaseous distension of the colon which, in the postoperative time frame may represent colonic ileus. IMPRESSION: 1. Tip of the enteric tube projects  over the left upper quadrant of the abdomen in the expected location of the proximal stomach. The side port for the enteric tube may be at or just above the level of the GE junction. 2. Findings compatible with mild colonic ileus. Electronically Signed   By: Signa Kell M.D.   On: 07/20/2017 20:11    Microbiology: Recent Results (from the past 240 hour(s))  Urine culture     Status: None   Collection Time: 07/20/17  2:31 PM  Result Value Ref Range Status   Specimen Description URINE, CATHETERIZED  Final   Special Requests NONE  Final   Culture NO GROWTH  Final   Report Status 07/21/2017 FINAL  Final  MRSA PCR Screening  Status: None   Collection Time: 07/20/17  4:14 PM  Result Value Ref Range Status   MRSA by PCR NEGATIVE NEGATIVE Final    Comment:        The GeneXpert MRSA Assay (FDA approved for NASAL specimens only), is one component of a comprehensive MRSA colonization surveillance program. It is not intended to diagnose MRSA infection nor to guide or monitor treatment for MRSA infections.   Culture, blood (routine x 2)     Status: None   Collection Time: 07/20/17  4:30 PM  Result Value Ref Range Status   Specimen Description BLOOD LEFT ANTECUBITAL  Final   Special Requests   Final    BOTTLES DRAWN AEROBIC AND ANAEROBIC Blood Culture adequate volume   Culture NO GROWTH 5 DAYS  Final   Report Status 07/25/2017 FINAL  Final  Culture, blood (routine x 2)     Status: None   Collection Time: 07/20/17  4:45 PM  Result Value Ref Range Status   Specimen Description BLOOD BLOOD LEFT FOREARM  Final   Special Requests   Final    BOTTLES DRAWN AEROBIC AND ANAEROBIC Blood Culture adequate volume   Culture NO GROWTH 5 DAYS  Final   Report Status 07/25/2017 FINAL  Final  C difficile quick scan w PCR reflex     Status: None   Collection Time: 07/20/17  4:48 PM  Result Value Ref Range Status   C Diff antigen NEGATIVE NEGATIVE Final   C Diff toxin NEGATIVE NEGATIVE Final   C  Diff interpretation No C. difficile detected.  Final     Labs: Basic Metabolic Panel: Recent Labs  Lab 07/20/17 1330  07/20/17 1627  07/21/17 0522  07/23/17 0500 07/24/17 0457 07/25/17 0727 07/26/17 0228 07/27/17 0310  NA 112*   < > 115*   < >  --    < > 125* 125* 132* 134* 133*  K 5.9*   < > 4.9   < >  --    < > 3.6 3.7 3.2* 2.8* 3.8  CL 82*   < > 90*   < >  --    < > 97* 94* 100* 100* 96*  CO2 16*  --  10*   < >  --    < > 20* 23 24 26 28   GLUCOSE 190*   < > 155*   < >  --    < > 103* 95 148* 169* 116*  BUN 37*   < > 34*   < >  --    < > 12 8 <5* <5* 8  CREATININE 1.42*   < > 1.01*   < >  --    < > 1.04* 0.85 0.58 0.64 0.52  CALCIUM 8.3*  --  7.8*   < >  --    < > 8.2* 7.8* 7.8* 8.0* 7.9*  MG 2.7*  --  2.4  --  2.3  --  2.2  --   --  1.8  --   PHOS  --   --  2.0*  --  2.2*  --   --   --   --   --   --    < > = values in this interval not displayed.   Liver Function Tests: Recent Labs  Lab 07/20/17 1330 07/20/17 1627 07/24/17 0457  AST 49* 41 25  ALT 31 26 15   ALKPHOS 100 86 56  BILITOT 1.7* 1.4* 0.5  PROT 6.6 5.8* 4.9*  ALBUMIN 2.9* 2.5* 2.1*   Recent Labs  Lab 07/20/17 1627  LIPASE 48   No results for input(s): AMMONIA in the last 168 hours. CBC: Recent Labs  Lab 07/20/17 1330  07/21/17 0522 07/22/17 0308 07/23/17 0500 07/24/17 1036 07/25/17 0727  WBC 8.9  --  7.5 9.4 10.9* 5.6 5.9  NEUTROABS 5.6  --   --   --   --   --   --   HGB 16.4*   < > 13.7 11.3* 10.3* 8.3* 8.8*  HCT 43.6   < > 36.9 31.4* 30.3* 24.1* 25.9*  MCV 85.0  --  86.6 86.7 90.2 91.3 91.8  PLT 178  --  131* 128* 150 157 177   < > = values in this interval not displayed.   Cardiac Enzymes: Recent Labs  Lab 07/20/17 1330 07/20/17 1627 07/20/17 2129 07/21/17 0108  CKTOTAL 276* 300*  --   --   CKMB  --  8.0*  --   --   TROPONINI  --  <0.03 <0.03 <0.03   BNP: BNP (last 3 results) No results for input(s): BNP in the last 8760 hours.  ProBNP (last 3 results) No results for  input(s): PROBNP in the last 8760 hours.  CBG: Recent Labs  Lab 07/20/17 1603 07/21/17 1957  GLUCAP 161* 151*       Signed:  Laverna Peace, MD [facility] 07/27/2017, 10:37 AM

## 2017-07-27 NOTE — Progress Notes (Signed)
PT Cancellation Note  Patient Details Name: Julie Conley MRN: [id number] DOB: 29-Dec-1975   Cancelled Treatment:    Reason Eval/Treat Not Completed: Medical issues which prohibited therapy;Other (comment) Attempted to see patient x2 this am.  Patient with N/V and declined PT.  This pm, returned for PT session and patient discharged.   Vena AustriaSusan H Eretria Manternach 07/27/2017, 2:39 PM Durenda HurtSusan H. Renaldo Fiddleravis, PT, [facility] Acute Rehab Services Pager [phone]

## 2017-08-04 ENCOUNTER — Ambulatory Visit: Payer: Self-pay | Admitting: Family Medicine

## 2017-08-09 ENCOUNTER — Ambulatory Visit: Payer: Self-pay | Admitting: Family Medicine

## 2017-10-06 ENCOUNTER — Emergency Department (HOSPITAL_BASED_OUTPATIENT_CLINIC_OR_DEPARTMENT_OTHER)
Admission: EM | Admit: 2017-10-06 | Discharge: 2017-10-06 | Disposition: A | Discharge status: Home / Self Care | Payer: Self-pay | Attending: Emergency Medicine | Admitting: Emergency Medicine

## 2017-10-06 ENCOUNTER — Emergency Department (HOSPITAL_BASED_OUTPATIENT_CLINIC_OR_DEPARTMENT_OTHER): Payer: Self-pay

## 2017-10-06 ENCOUNTER — Other Ambulatory Visit: Payer: Self-pay

## 2017-10-06 ENCOUNTER — Encounter (HOSPITAL_BASED_OUTPATIENT_CLINIC_OR_DEPARTMENT_OTHER): Payer: Self-pay | Admitting: *Deleted

## 2017-10-06 DIAGNOSIS — H5712 Ocular pain, left eye: Secondary | ICD-10-CM | POA: Insufficient documentation

## 2017-10-06 DIAGNOSIS — Z79899 Other long term (current) drug therapy: Secondary | ICD-10-CM | POA: Insufficient documentation

## 2017-10-06 DIAGNOSIS — F17210 Nicotine dependence, cigarettes, uncomplicated: Secondary | ICD-10-CM | POA: Insufficient documentation

## 2017-10-06 DIAGNOSIS — Z91040 Latex allergy status: Secondary | ICD-10-CM | POA: Insufficient documentation

## 2017-10-06 DIAGNOSIS — L03213 Periorbital cellulitis: Secondary | ICD-10-CM | POA: Insufficient documentation

## 2017-10-06 DIAGNOSIS — E039 Hypothyroidism, unspecified: Secondary | ICD-10-CM | POA: Insufficient documentation

## 2017-10-06 MED ORDER — ERYTHROMYCIN 5 MG/GM OP OINT: TOPICAL_OINTMENT | OPHTHALMIC | 0 refills | Status: DC

## 2017-10-06 MED ORDER — DOXYCYCLINE HYCLATE 100 MG PO CAPS: 100.0000 mg | ORAL_CAPSULE | Freq: Two times a day (BID) | ORAL | 0 refills | Status: DC

## 2017-10-06 NOTE — ED Notes (Signed)
EDP at bedside  

## 2017-10-06 NOTE — ED Provider Notes (Signed)
[facility] EMERGENCY DEPARTMENT Provider Note   CSN: [id number] Arrival date & time: 10/06/17  1746     History   Chief Complaint Chief Complaint  Patient presents with  . Eye Problem    HPI Julie Conley is a 42 y.o. female.  Patient with complaint of redness swelling irritation to the outside of the right eye for 1 day.  It was itchy last night and patient was rubbing it.  Was thought initially it may be some allergies she has had that in the past.  There is some pain with movement of the eyes.  Patient has contacts but has not worn them in weeks.  No concern for foreign body.      Past Medical History:  Diagnosis Date  . Alcohol abuse   . Anxiety   . Bimalleolar fracture of left ankle 04/2015  . Complication of anesthesia    hypotension with breast augmentation  . Depression   . GERD (gastroesophageal reflux disease)   . Headache    "once/week" (03/20/2016)  . Hypothyroidism 2010-2012   "stopped RX in 2012 when I didn't meet standards" (03/20/2016)  . Iron deficiency anemia   . Migraines    "once/month" (03/20/2016)  . Pneumonia "several times"  . Sciatic pain 04/12/2015   left    Patient Active Problem List   Diagnosis Date Noted  . GERD (gastroesophageal reflux disease) 07/24/2017  . Alcohol abuse   . 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 Pressure injury of skin 07/21/2017  . Acute encephalopathy 07/20/2017  . Thrombocytopenia (HCC) 06/14/2017  . Alcohol withdrawal (HCC) 06/14/2017  . 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 Alcohol withdrawal syndrome without complication (HCC) 04/24/2016  . Sinus tachycardia 04/24/2016  . Normocytic anemia 04/24/2016  . Alcoholic ketoacidosis 03/20/2016  . Chest pain 03/20/2016  . Alcohol consumption binge drinking 03/20/2016  . Depression 03/20/2016  . Gastritis 03/20/2016  . 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 Elevated lipase   . Hypokalemia 01/20/2016  . Hyperglycemia 01/20/2016  .  Hyponatremia 01/20/2016  . Alcohol dependence with withdrawal with complication (HCC) 11/05/2014  . 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 Hypothyroidism 11/20/2011    Past Surgical History:  Procedure Laterality Date  . ABDOMINOPLASTY  2015  . APPENDECTOMY  ~ 1986  . AUGMENTATION MAMMAPLASTY  2015  . CESAREAN SECTION  1996; 2000  . CYSTOSCOPY WITH HYDRODISTENSION AND BIOPSY  2010  . DILATION AND CURETTAGE OF UTERUS  2010  . ENDOMETRIAL ABLATION  2010  . FRACTURE SURGERY    . LAPAROSCOPIC CHOLECYSTECTOMY  2006  . LAPAROSCOPIC TUBAL LIGATION  2001   w/banding  . LAPAROTOMY  2011  . NASAL SEPTOPLASTY W/ TURBINOPLASTY  1991; 2008  . ORIF ANKLE FRACTURE Left 04/15/2015   Procedure: OPEN REDUCTION INTERNAL FIXATION (ORIF) LEFT BIMALLEOLAR AND SYNDESMOSIS ANKLE FRACTURE;  Surgeon: Sheral Apley, MD;  Location: [facility];  Service: Orthopedics;  Laterality: Left;  . ROUX-EN-Y GASTRIC BYPASS  2009  . TONSILLECTOMY  ~ 1981  . VAGINAL HYSTERECTOMY  2013   complete    OB History    No data available       Home Medications    Prior to Admission medications   Medication Sig Start Date End Date Taking? Authorizing Provider  doxycycline (VIBRAMYCIN) 100 MG capsule Take 1 capsule (100 mg total) by mouth 2 (two) times daily. 10/06/17   Vanetta Mulders, MD  DULoxetine (CYMBALTA) 30 MG capsule Take 1 capsule (30 mg total) by mouth  daily. 07/27/17   Roberto ScalesNettey, Shayla D, MD  erythromycin ophthalmic ointment Place a 1/2 inch ribbon of ointment into the right lower eyelid TID 10/06/17   Vanetta MuldersZackowski, Donyale Berthold, MD  folic acid (FOLVITE) 1 MG tablet Take 1 tablet (1 mg total) by mouth daily. 07/27/17   Laverna PeaceNettey, Shayla D, MD  gabapentin (NEURONTIN) 300 MG capsule Take 1 capsule (300 mg total) by mouth 3 (three) times daily. 07/27/17   Roberto ScalesNettey, Shayla D, MD  Hydrocortisone (GERHARDT'S BUTT CREAM) CREA Apply 1 application topically 4 (four) times daily. 07/27/17   Laverna PeaceNettey, Shayla D, MD    levothyroxine (SYNTHROID, LEVOTHROID) 50 MCG tablet Take 1 tablet (50 mcg total) by mouth daily before breakfast. 07/28/17   Roberto ScalesNettey, Shayla D, MD  metoprolol tartrate (LOPRESSOR) 25 MG tablet Take 1 tablet (25 mg total) by mouth 2 (two) times daily. 07/27/17   Laverna PeaceNettey, Shayla D, MD  pantoprazole (PROTONIX) 40 MG tablet Take 1 tablet (40 mg total) by mouth daily. 07/27/17   Roberto ScalesNettey, Shayla D, MD  thiamine 100 MG tablet Take 1 tablet (100 mg total) by mouth daily. 07/27/17   Laverna PeaceNettey, Shayla D, MD    Family History Family History  Problem Relation Age of Onset  . Drug abuse Brother   . Alcohol abuse Father   . Drug abuse Mother     Social History Social History   Tobacco Use  . Smoking status: Current Every Day Smoker    Packs/day: 0.25    Years: 0.50    Pack years: 0.12    Types: Cigarettes  . Smokeless tobacco: Never Used  Substance Use Topics  . Alcohol use: Yes    Comment: Drinks 2-5ths per day and 2 big bottles of wine on Sundays   . Drug use: No     Allergies   Adhesive [tape]; Ambien [zolpidem tartrate]; Latex; and Sulfa antibiotics   Review of Systems Review of Systems  Constitutional: Negative for fever.  HENT: Negative for congestion.   Eyes: Positive for pain, itching and visual disturbance. Negative for redness.  Respiratory: Negative for shortness of breath.   Cardiovascular: Negative for chest pain.  Gastrointestinal: Negative for abdominal pain.  Genitourinary: Negative for dysuria.  Musculoskeletal: Negative for back pain.  Skin: Negative for rash.  Neurological: Negative for headaches.  Hematological: Does not bruise/bleed easily.  Psychiatric/Behavioral: Negative for confusion.     Physical Exam Updated Vital Signs BP 140/85   Pulse 99   Temp 98.6 F (37 C) (Oral)   Resp 18   Ht 1.626 m (5\' 4" )   Wt 81.6 kg (180 lb)   SpO2 99%   BMI 30.90 kg/m   Physical Exam  Constitutional: She appears well-developed and well-nourished. No distress.   HENT:  Head: Normocephalic and atraumatic.  Mouth/Throat: Oropharynx is clear and moist.  Eyes: Conjunctivae are normal. Pupils are equal, round, and reactive to light.  Left eye normal.  Right eye with erythema and swelling to the outside part of the eye including lower lid.  Some crusting.  Conjunctiva without any erythema.  Pupils are acting normally.  Patient with some pain in the right eye with upward gaze and medial gaze.  Neck: Neck supple.  Cardiovascular: Normal rate.  Pulmonary/Chest: Effort normal and breath sounds normal.  Abdominal: Soft. Bowel sounds are normal.  Nursing note and vitals reviewed.    ED Treatments / Results  Labs (all labs ordered are listed, but only abnormal results are displayed) Labs Reviewed - No data to display  EKG  EKG Interpretation None       Radiology Ct Orbitss W/o Cm  Result Date: 10/06/2017 CLINICAL DATA:  Right eye swelling. EXAM: CT ORBITS WITHOUT CONTRAST TECHNIQUE: Multidetector CT images were obtained using the standard protocol without intravenous contrast. COMPARISON:  07/20/2017 FINDINGS: Orbits: No traumatic or inflammatory finding. Globes, optic nerves, orbital fat, extraocular muscles, vascular structures, and lacrimal glands are normal. 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 Visualized sinuses: Clear. Soft tissues: Mild asymmetric right-sided preseptal soft tissue thickening. Limited intracranial: No significant or unexpected finding. IMPRESSION: 1. Mild right-sided preseptal cellulitis 2. 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   On: 10/06/2017 19:07    Procedures Procedures (including critical care time)  Medications Ordered in ED Medications - No data to display   Initial Impression / Assessment and Plan / ED Course  I have reviewed the triage vital signs and the nursing notes.  Pertinent labs & imaging results that were available during my care  of the patient were reviewed by me and considered in my medical decision making (see chart for details).    Patient's symptoms were concerning for cellulitis.  And consistent with it.  The question is preseptal or orbital cellulitis.  CT scan was done to rule out orbital cellulitis.  CT scan shows no evidence of orbital cellulitis.  It is consistent with a preseptal cellulitis.  Clinically the main concern was that she had pain with movement of her right eye.  Patient will be treated with oral doxycycline for 7 days.  And also erythromycin ophthalmic ointment.  Patient will return for any new or worse symptoms.  Patient can take Tylenol or Motrin for pain.  Would expect improvement over the next couple days.  Patient knows to keep her contacts out.   Final Clinical Impressions(s) / ED Diagnoses   Final diagnoses:  Periorbital cellulitis of right eye    ED Discharge Orders        Ordered    doxycycline (VIBRAMYCIN) 100 MG capsule  2 times daily     10/06/17 1924    erythromycin ophthalmic ointment     10/06/17 1924       Vanetta Mulders, MD 10/06/17 1930

## 2017-10-06 NOTE — ED Triage Notes (Signed)
Pt c/o right eye redness x 1 day

## 2017-10-06 NOTE — ED Notes (Signed)
Pt verbalizes understanding of d/c instructions and denies any further needs at this time. 

## 2017-10-06 NOTE — Discharge Instructions (Signed)
Take the antibiotic doxycycline for the next 7 days.  Use the erythromycin eye ointment 3 times a day.  The oral antibiotic is more important if cost become a problem.  CT today showed no evidence of any infection behind the eye.  Is on the outside and around the eye.  We call this a preseptal cellulitis.  Return for any new or worse symptoms.  Can take Tylenol as needed for discomfort.  Can also take Motrin as needed.  As the antibiotic start to work the discomfort should improve significantly.

## 2018-05-14 IMAGING — CT CT ANGIO CHEST
3 of 7 series · 17 of 36 positions shown · IV contrast (Omni 300)
Comparison: None

CLINICAL DATA: Chest and back pain beginning last night, more
intense this morning, burning LEFT upper quadrant and epigastric
pain, shortness of breath, former smoker

EXAM:
CT ANGIOGRAPHY CHEST WITH CONTRAST
TECHNIQUE: Multidetector CT imaging of the chest was performed using the
standard protocol during bolus administration of intravenous
contrast. Multiplanar CT image reconstructions and MIPs were
obtained to evaluate the vascular anatomy.
CONTRAST:  100 cc Isovue 370 IV

[Series 9: pe lung · axial · 0.73mm/px · z∈[+1146,+1258]mm · 3 of 112 slices shown]
[im 28/112  mediastinal]
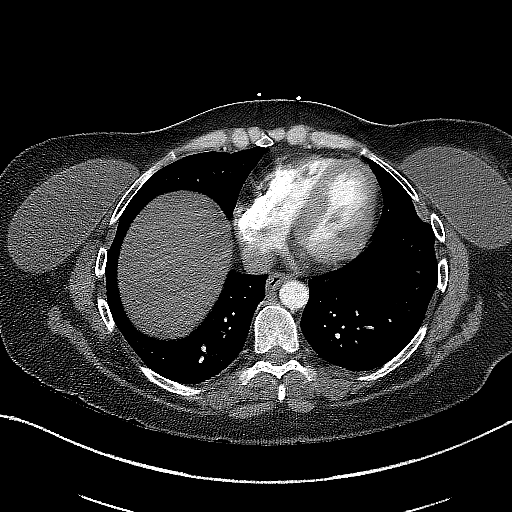
[im 56/112  mediastinal]
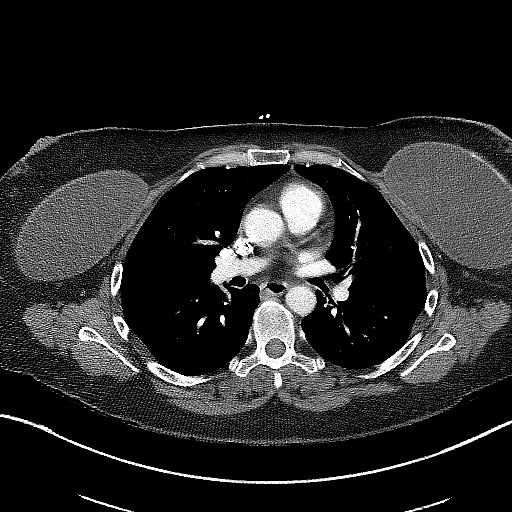
[im 84/112  mediastinal]
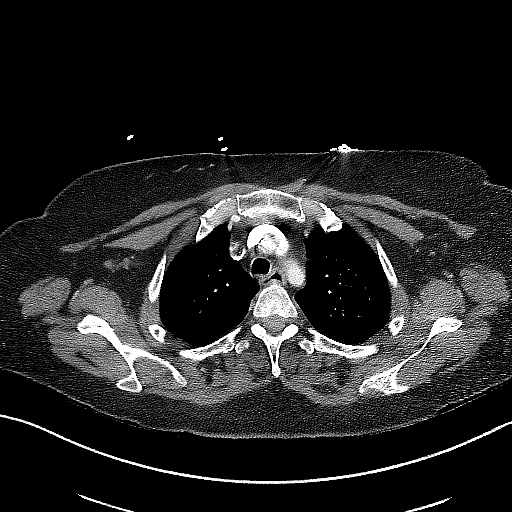

[Series 10: pe thins · axial · 0.73mm/px · z∈[+1003,+1290]mm · 13 of 335 slices shown]
[im 24/335  lung]
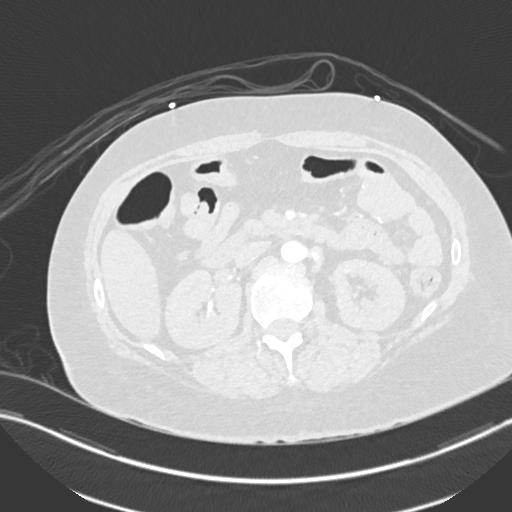
[im 48/335  mediastinal]
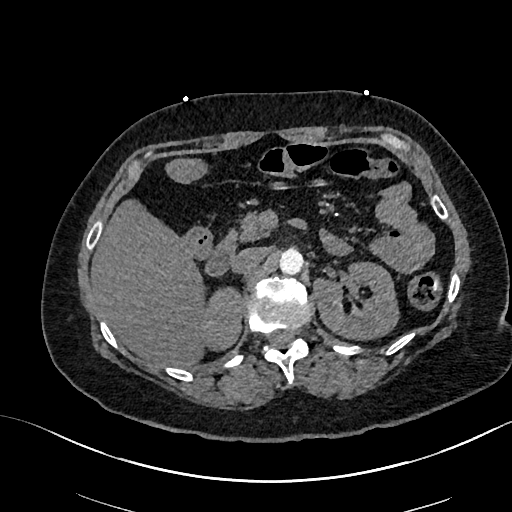
[im 72/335  lung]
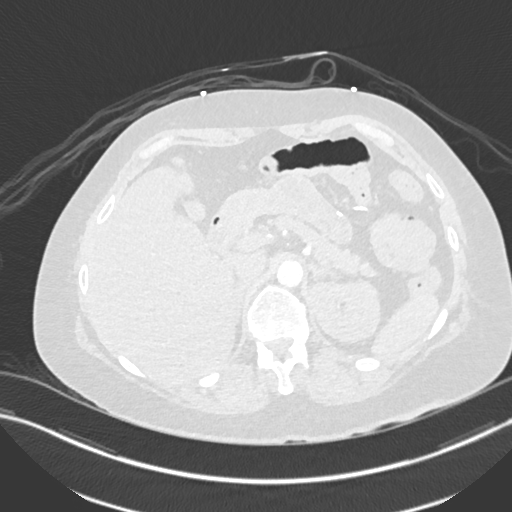
[im 96/335  mediastinal]
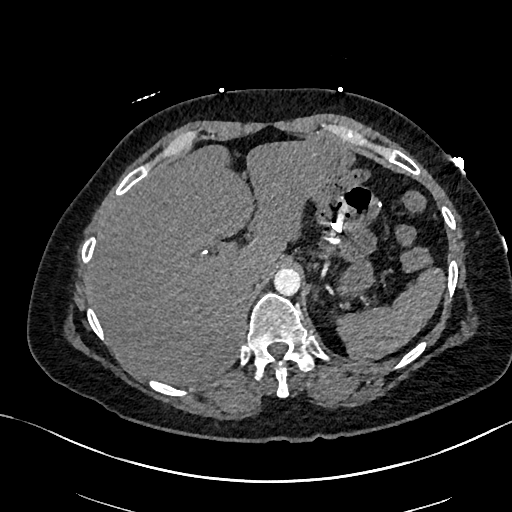
[im 120/335  lung]
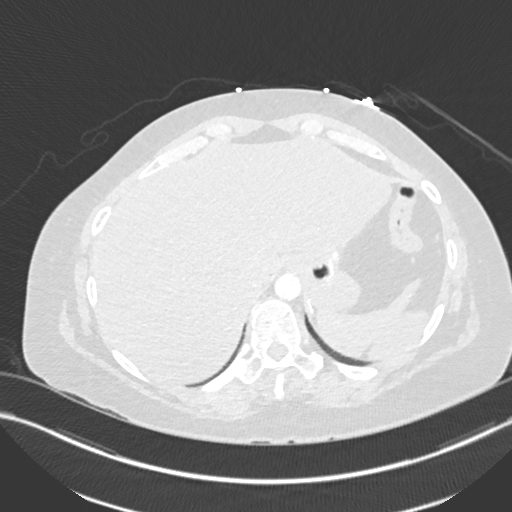
[im 144/335  mediastinal]
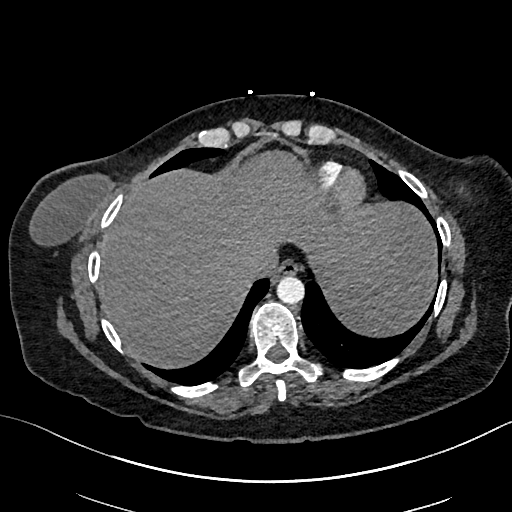
[im 168/335  lung]
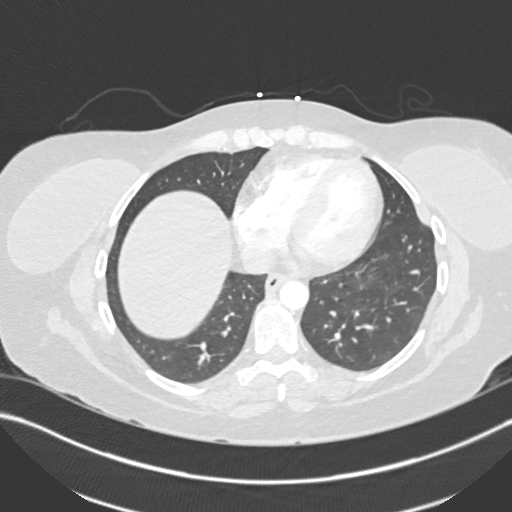
[im 191/335  mediastinal]
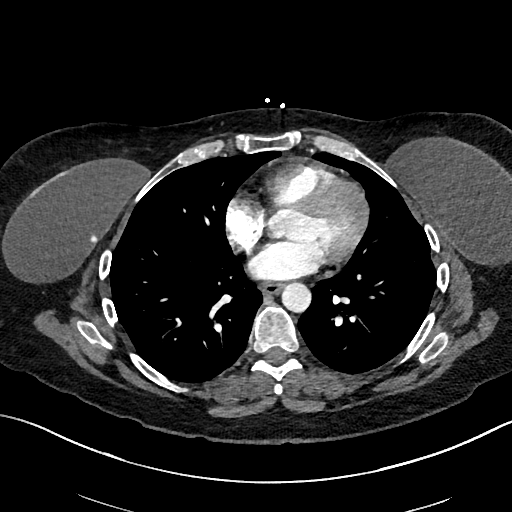
[im 215/335  lung]
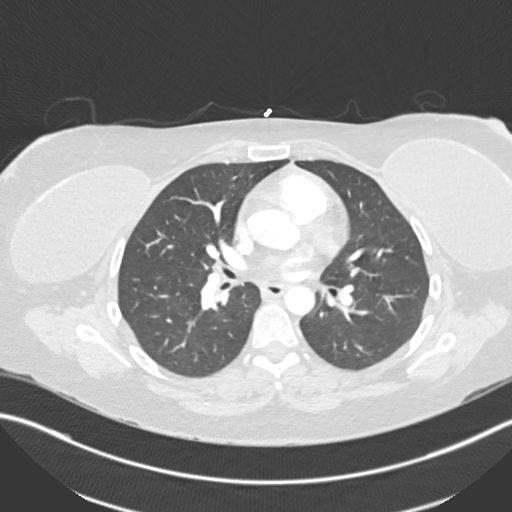
[im 239/335  mediastinal]
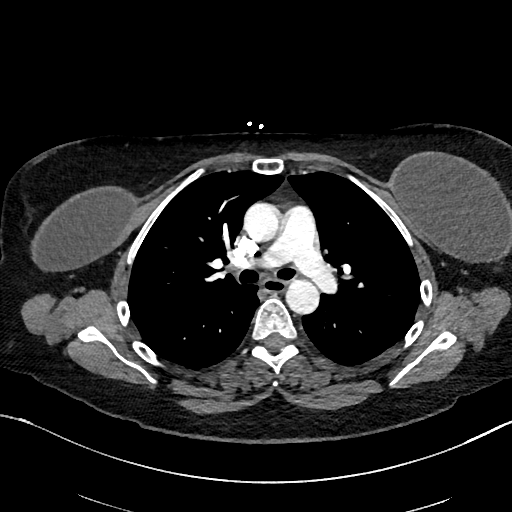
[im 263/335  lung]
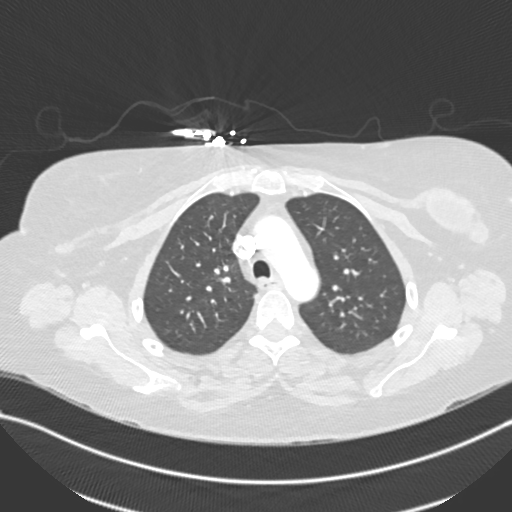
[im 287/335  mediastinal]
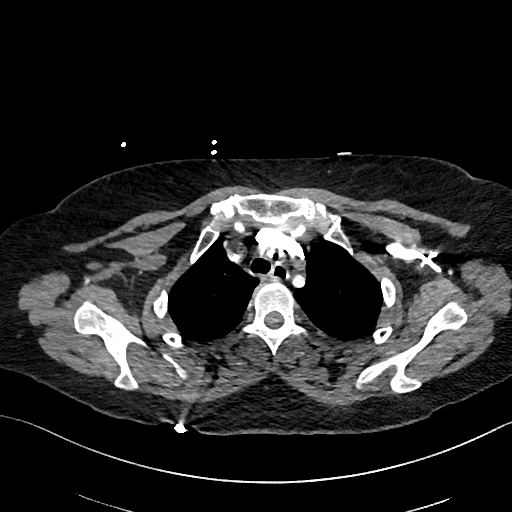
[im 311/335  lung]
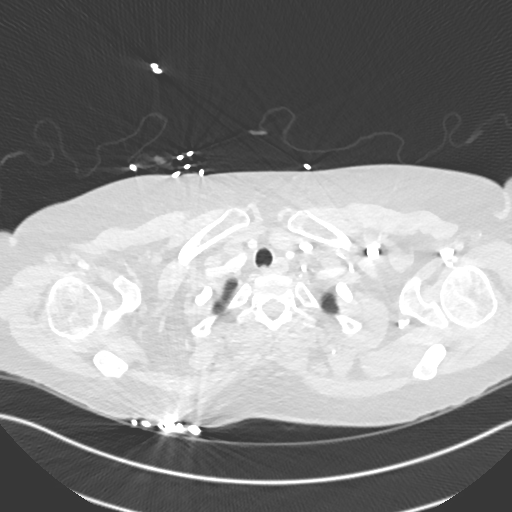

[Series 11: pe 2mm cor · coronal · 0.57mm/px · 1 of 151 slices shown]
[im 76/151  mediastinal]
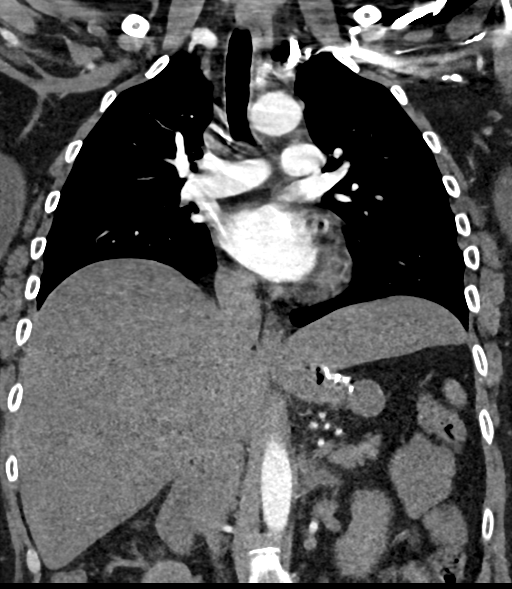

[17 of 36 positions shown; findings below may reference images not displayed]

FINDINGS: Cardiovascular: Aorta normal caliber without aneurysm or dissection.
Pulmonary arteries well opacified and patent. No evidence of
pulmonary embolism. No pericardial effusion.

Mediastinum/Nodes: Few normal size mediastinal lymph nodes without
thoracic adenopathy. Base of cervical region normal appearance.

Lungs/Pleura: Lungs clear.  No pleural effusion or pneumothorax.

Upper Abdomen: Post cholecystectomy.  Post gastric bypass surgery.

Musculoskeletal: No acute osseous findings. BILATERAL breast
prostheses.

Review of the MIP images confirms the above findings.
IMPRESSION: No evidence of pulmonary embolism.

No acute intra thoracic abnormalities.

## 2018-08-08 ENCOUNTER — Emergency Department (HOSPITAL_BASED_OUTPATIENT_CLINIC_OR_DEPARTMENT_OTHER)

## 2018-08-08 ENCOUNTER — Emergency Department (HOSPITAL_BASED_OUTPATIENT_CLINIC_OR_DEPARTMENT_OTHER)
Admission: EM | Admit: 2018-08-08 | Discharge: 2018-08-08 | Disposition: A | Discharge status: Home / Self Care | Attending: Emergency Medicine | Admitting: Emergency Medicine

## 2018-08-08 ENCOUNTER — Encounter (HOSPITAL_BASED_OUTPATIENT_CLINIC_OR_DEPARTMENT_OTHER): Payer: Self-pay | Admitting: *Deleted

## 2018-08-08 ENCOUNTER — Other Ambulatory Visit: Payer: Self-pay

## 2018-08-08 DIAGNOSIS — Z9049 Acquired absence of other specified parts of digestive tract: Secondary | ICD-10-CM | POA: Diagnosis not present

## 2018-08-08 DIAGNOSIS — W010XXA Fall on same level from slipping, tripping and stumbling without subsequent striking against object, initial encounter: Secondary | ICD-10-CM | POA: Insufficient documentation

## 2018-08-08 DIAGNOSIS — S8001XA Contusion of right knee, initial encounter: Secondary | ICD-10-CM | POA: Diagnosis not present

## 2018-08-08 DIAGNOSIS — S4991XA Unspecified injury of right shoulder and upper arm, initial encounter: Secondary | ICD-10-CM | POA: Diagnosis present

## 2018-08-08 DIAGNOSIS — Y9389 Activity, other specified: Secondary | ICD-10-CM | POA: Insufficient documentation

## 2018-08-08 DIAGNOSIS — Y929 Unspecified place or not applicable: Secondary | ICD-10-CM | POA: Insufficient documentation

## 2018-08-08 DIAGNOSIS — Z91040 Latex allergy status: Secondary | ICD-10-CM | POA: Insufficient documentation

## 2018-08-08 DIAGNOSIS — Z9884 Bariatric surgery status: Secondary | ICD-10-CM | POA: Insufficient documentation

## 2018-08-08 DIAGNOSIS — Y998 Other external cause status: Secondary | ICD-10-CM | POA: Diagnosis not present

## 2018-08-08 DIAGNOSIS — F10120 Alcohol abuse with intoxication, uncomplicated: Secondary | ICD-10-CM | POA: Diagnosis not present

## 2018-08-08 DIAGNOSIS — E039 Hypothyroidism, unspecified: Secondary | ICD-10-CM | POA: Insufficient documentation

## 2018-08-08 DIAGNOSIS — S40011A Contusion of right shoulder, initial encounter: Secondary | ICD-10-CM | POA: Insufficient documentation

## 2018-08-08 DIAGNOSIS — F329 Major depressive disorder, single episode, unspecified: Secondary | ICD-10-CM | POA: Insufficient documentation

## 2018-08-08 DIAGNOSIS — F10920 Alcohol use, unspecified with intoxication, uncomplicated: Secondary | ICD-10-CM

## 2018-08-08 DIAGNOSIS — Z79899 Other long term (current) drug therapy: Secondary | ICD-10-CM | POA: Diagnosis not present

## 2018-08-08 DIAGNOSIS — F419 Anxiety disorder, unspecified: Secondary | ICD-10-CM | POA: Diagnosis not present

## 2018-08-08 DIAGNOSIS — F17210 Nicotine dependence, cigarettes, uncomplicated: Secondary | ICD-10-CM | POA: Diagnosis not present

## 2018-08-08 LAB — CBC WITH DIFFERENTIAL/PLATELET
Abs Immature Granulocytes: 0.01 10*3/uL (ref 0.00–0.07)
BASOS ABS: 0 10*3/uL (ref 0.0–0.1)
BASOS PCT: 0 %
EOS PCT: 2 %
Eosinophils Absolute: 0.1 10*3/uL (ref 0.0–0.5)
HCT: 38.9 % (ref 36.0–46.0)
Hemoglobin: 12.8 g/dL (ref 12.0–15.0)
Immature Granulocytes: 0 %
Lymphocytes Relative: 47 %
Lymphs Abs: 2.5 10*3/uL (ref 0.7–4.0)
MCH: 26.6 pg (ref 26.0–34.0)
MCHC: 32.9 g/dL (ref 30.0–36.0)
MCV: 80.7 fL (ref 80.0–100.0)
MONO ABS: 0.2 10*3/uL (ref 0.1–1.0)
Monocytes Relative: 4 %
NEUTROS PCT: 47 %
Neutro Abs: 2.6 10*3/uL (ref 1.7–7.7)
PLATELETS: 142 10*3/uL — AB (ref 150–400)
RBC: 4.82 MIL/uL (ref 3.87–5.11)
RDW: 19.1 % — ABNORMAL HIGH (ref 11.5–15.5)
WBC: 5.4 10*3/uL (ref 4.0–10.5)
nRBC: 0 % (ref 0.0–0.2)

## 2018-08-08 LAB — BASIC METABOLIC PANEL
ANION GAP: 16 — AB (ref 5–15)
BUN: 13 mg/dL (ref 6–20)
CALCIUM: 9 mg/dL (ref 8.9–10.3)
CO2: 21 mmol/L — AB (ref 22–32)
Chloride: 96 mmol/L — ABNORMAL LOW (ref 98–111)
Creatinine, Ser: 0.8 mg/dL (ref 0.44–1.00)
GFR calc Af Amer: >60 mL/min (ref >60)
GFR calc non Af Amer: >60 mL/min (ref >60)
Glucose, Bld: 92 mg/dL (ref 70–99)
Potassium: 3.9 mmol/L (ref 3.5–5.1)
Sodium: 133 mmol/L — ABNORMAL LOW (ref 135–145)

## 2018-08-08 LAB — ETHANOL: Alcohol, Ethyl (B): 281 mg/dL — ABNORMAL HIGH (ref <10)

## 2018-08-08 MED ORDER — THIAMINE HCL 100 MG/ML IJ SOLN
100.0000 mg | Freq: Every day | INTRAMUSCULAR | Status: DC
  Administered 2018-08-08: 100 mg via INTRAVENOUS
  Filled 2018-08-08: qty 2

## 2018-08-08 MED ORDER — PROMETHAZINE HCL 25 MG PO TABS
25.0000 mg | ORAL_TABLET | Freq: Once | ORAL | Status: AC
  Administered 2018-08-08: 25 mg via ORAL
  Filled 2018-08-08: qty 1

## 2018-08-08 MED ORDER — LORAZEPAM 2 MG/ML IJ SOLN: 0.0000 mg | Freq: Two times a day (BID) | INTRAMUSCULAR | Status: DC

## 2018-08-08 MED ORDER — LORAZEPAM 1 MG PO TABS: 0.0000 mg | ORAL_TABLET | Freq: Four times a day (QID) | ORAL | Status: DC

## 2018-08-08 MED ORDER — PROMETHAZINE HCL 25 MG PO TABS: 25.0000 mg | ORAL_TABLET | Freq: Four times a day (QID) | ORAL | 0 refills | Status: DC | PRN

## 2018-08-08 MED ORDER — LORAZEPAM 1 MG PO TABS: 0.0000 mg | ORAL_TABLET | Freq: Two times a day (BID) | ORAL | Status: DC

## 2018-08-08 MED ORDER — CHLORDIAZEPOXIDE HCL 25 MG PO CAPS: ORAL_CAPSULE | ORAL | 0 refills | Status: DC

## 2018-08-08 MED ORDER — CHLORDIAZEPOXIDE HCL 25 MG PO CAPS
25.0000 mg | ORAL_CAPSULE | Freq: Once | ORAL | Status: AC
  Administered 2018-08-08: 25 mg via ORAL
  Filled 2018-08-08: qty 1

## 2018-08-08 MED ORDER — LORAZEPAM 2 MG/ML IJ SOLN
0.0000 mg | Freq: Four times a day (QID) | INTRAMUSCULAR | Status: DC
  Administered 2018-08-08: 2 mg via INTRAVENOUS
  Filled 2018-08-08: qty 1

## 2018-08-08 MED ORDER — VITAMIN B-1 100 MG PO TABS: 100.0000 mg | ORAL_TABLET | Freq: Every day | ORAL | Status: DC

## 2018-08-08 NOTE — ED Notes (Signed)
Patient transported to X-ray 

## 2018-08-08 NOTE — ED Triage Notes (Addendum)
Pt states that's she is withdrawing from etoh. States she has not been eating for past couple weeks. Pt states last etoh was Sunday morning. Beer and wine. Pt states she was drinking and fell causing bruising to her knees and right shoulder is hurting. Loc unknown.  States she had surgery to right shoulder in Aug. Pt presents anxious with her mother.

## 2018-08-08 NOTE — ED Provider Notes (Signed)
[facility] EMERGENCY DEPARTMENT Provider Note   CSN: [id number] Arrival date & time: 08/08/18  0138     History   Chief Complaint Chief Complaint  Patient presents with  . Alcohol Intoxication    HPI Julie Conley is a 42 y.o. female.  HPI  This is a 42 year old female with a history of alcohol abuse, depression, hypothyroidism, migraines who presents "because I am withdrawing."  Patient reports history of withdrawal in the past.  No known history of DTs but did present with altered mental status requiring intubation and ICU stay.  Patient reports that she binge drinks.  She has been drinking for the last 2 weeks daily.  She reports drinking wine and beer.  She states that her last drink was yesterday around 1 PM.  She states that during this time she has had 2 falls.  She is reporting pain to the right knee and right shoulder.  Patient reports she feels very anxious with a headache.  These are her symptoms.  She denies any other drug use.  She denies suicidal or homicidal ideation.  I reviewed the patient's chart.  She is admitted to the hospital in ICU with intubation for presumed withdrawal.  She was found unconscious but blood alcohol level was negative.  After extubation, she did not trigger CIWA.  He has previously been seen several times and admitted to the hospitalist for metabolic derangements related to her alcohol abuse.  She also has been sent home with Librium tapers.  Patient reports that she has tried outpatient detox several times.  Past Medical History:  Diagnosis Date  . Alcohol abuse   . Anxiety   . Bimalleolar fracture of left ankle 04/2015  . Complication of anesthesia    hypotension with breast augmentation  . Depression   . GERD (gastroesophageal reflux disease)   . Headache    "once/week" (03/20/2016)  . Hypothyroidism 2010-2012   "stopped RX in 2012 when I didn't meet standards" (03/20/2016)  . Iron deficiency anemia   . Migraines    "once/month" (03/20/2016)  . Pneumonia "several times"  . Sciatic pain 04/12/2015   left    Patient Active Problem List   Diagnosis Date Noted  . GERD (gastroesophageal reflux disease) 07/24/2017  . Alcohol abuse   . 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 Pressure injury of skin 07/21/2017  . Acute encephalopathy 07/20/2017  . Thrombocytopenia (HCC) 06/14/2017  . Alcohol withdrawal (HCC) 06/14/2017  . 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 Alcohol withdrawal syndrome without complication (HCC) 04/24/2016  . Sinus tachycardia 04/24/2016  . Normocytic anemia 04/24/2016  . Alcoholic ketoacidosis 03/20/2016  . Chest pain 03/20/2016  . Alcohol consumption binge drinking 03/20/2016  . Depression 03/20/2016  . Gastritis 03/20/2016  . 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 Elevated lipase   . Hypokalemia 01/20/2016  . Hyperglycemia 01/20/2016  . Hyponatremia 01/20/2016  . Alcohol dependence with withdrawal with complication (HCC) 11/05/2014  . 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 Hypothyroidism 11/20/2011    Past Surgical History:  Procedure Laterality Date  . ABDOMINOPLASTY  2015  . APPENDECTOMY  ~ 1986  . AUGMENTATION MAMMAPLASTY  2015  . CESAREAN SECTION  1996; 2000  . CYSTOSCOPY WITH HYDRODISTENSION AND BIOPSY  2010  . DILATION AND CURETTAGE OF UTERUS  2010  . ENDOMETRIAL ABLATION  2010  . FRACTURE SURGERY    . LAPAROSCOPIC CHOLECYSTECTOMY  2006  . LAPAROSCOPIC TUBAL LIGATION  2001   w/banding  . LAPAROTOMY  2011  .  NASAL SEPTOPLASTY W/ TURBINOPLASTY  1991; 2008  . ORIF ANKLE FRACTURE Left 04/15/2015   Procedure: OPEN REDUCTION INTERNAL FIXATION (ORIF) LEFT BIMALLEOLAR AND SYNDESMOSIS ANKLE FRACTURE;  Surgeon: Sheral Apley, MD;  Location: [facility];  Service: Orthopedics;  Laterality: Left;  . ROUX-EN-Y GASTRIC BYPASS  2009  . SHOULDER ARTHROSCOPY    . TONSILLECTOMY  ~ 1981  . VAGINAL HYSTERECTOMY  2013   complete     OB History   None       Home Medications    Prior to Admission medications   Medication Sig Start Date End Date Taking? Authorizing Provider  chlordiazePOXIDE (LIBRIUM) 25 MG capsule 50mg  PO TID x 1D, then 25-50mg  PO BID X 1D, then 25-50mg  PO QD X 1D 08/08/18   Horton, Mayer Masker, MD  doxycycline (VIBRAMYCIN) 100 MG capsule Take 1 capsule (100 mg total) by mouth 2 (two) times daily. 10/06/17   Vanetta Mulders, MD  DULoxetine (CYMBALTA) 30 MG capsule Take 1 capsule (30 mg total) by mouth daily. 07/27/17   Roberto Scales D, MD  erythromycin ophthalmic ointment Place a 1/2 inch ribbon of ointment into the right lower eyelid TID 10/06/17   Vanetta Mulders, MD  folic acid (FOLVITE) 1 MG tablet Take 1 tablet (1 mg total) by mouth daily. 07/27/17   Laverna Peace, MD  gabapentin (NEURONTIN) 300 MG capsule Take 1 capsule (300 mg total) by mouth 3 (three) times daily. 07/27/17   Roberto Scales D, MD  Hydrocortisone (GERHARDT'S BUTT CREAM) CREA Apply 1 application topically 4 (four) times daily. 07/27/17   Laverna Peace, MD  levothyroxine (SYNTHROID, LEVOTHROID) 50 MCG tablet Take 1 tablet (50 mcg total) by mouth daily before breakfast. 07/28/17   Roberto Scales D, MD  metoprolol tartrate (LOPRESSOR) 25 MG tablet Take 1 tablet (25 mg total) by mouth 2 (two) times daily. 07/27/17   Laverna Peace, MD  pantoprazole (PROTONIX) 40 MG tablet Take 1 tablet (40 mg total) by mouth daily. 07/27/17   Laverna Peace, MD  promethazine (PHENERGAN) 25 MG tablet Take 1 tablet (25 mg total) by mouth every 6 (six) hours as needed for nausea or vomiting. 08/08/18   Horton, Mayer Masker, MD  thiamine 100 MG tablet Take 1 tablet (100 mg total) by mouth daily. 07/27/17   Laverna Peace, MD    Family History Family History  Problem Relation Age of Onset  . Drug abuse Brother   . Alcohol abuse Father   . Drug abuse Mother     Social History Social History   Tobacco Use  . Smoking status: Current Every Day Smoker    Packs/day:  0.25    Years: 0.50    Pack years: 0.12    Types: Cigarettes  . Smokeless tobacco: Never Used  Substance Use Topics  . Alcohol use: Yes    Comment: Drinks 2-5ths per day and 2 big bottles of wine on Sundays   . Drug use: No     Allergies   Adhesive [tape]; Ambien [zolpidem tartrate]; Zofran [ondansetron]; Latex; and Sulfa antibiotics   Review of Systems Review of Systems  Constitutional: Negative for fever.  Respiratory: Negative for shortness of breath.   Cardiovascular: Negative for chest pain.  Gastrointestinal: Positive for diarrhea and nausea. Negative for abdominal pain and vomiting.  Genitourinary: Negative for dysuria.  Musculoskeletal: Negative for back pain.       Shoulder pain, knee pain  Neurological: Positive for headaches.  Psychiatric/Behavioral: Positive for dysphoric  mood. Negative for self-injury. The patient is nervous/anxious.   All other systems reviewed and are negative.    Physical Exam Updated Vital Signs BP 128/87   Pulse 95   Temp 98.4 F (36.9 C)   Resp 20   Ht 1.626 m (5\' 4" )   Wt 87.1 kg   SpO2 100%   BMI 32.96 kg/m   Physical Exam  Constitutional: She is oriented to person, place, and time. She appears well-developed and well-nourished.  Pressured speech but nontoxic-appearing  HENT:  Head: Normocephalic and atraumatic.  Eyes: Pupils are equal, round, and reactive to light.  Neck: Neck supple.  Cardiovascular: Normal rate, regular rhythm and normal heart sounds.  Pulmonary/Chest: Effort normal and breath sounds normal. No respiratory distress. She has no wheezes.  Abdominal: Soft. Bowel sounds are normal. There is no tenderness.  Musculoskeletal:  Limited range of motion right shoulder, no obvious deformity, extensive anterior scarring noted, scar clean dry and intact Normal range of motion bilateral knees, bruising noted over the anterior aspect of the right knee, no joint laxity noted  Neurological: She is alert and oriented to  person, place, and time.  No tremor noted  Skin: Skin is warm and dry.  Psychiatric: She has a normal mood and affect.  Nursing note and vitals reviewed.    ED Treatments / Results  Labs (all labs ordered are listed, but only abnormal results are displayed) Labs Reviewed  BASIC METABOLIC PANEL - Abnormal; Notable for the following components:      Result Value   Sodium 133 (*)    Chloride 96 (*)    CO2 21 (*)    Anion gap 16 (*)    All other components within normal limits  CBC WITH DIFFERENTIAL/PLATELET - Abnormal; Notable for the following components:   RDW 19.1 (*)    Platelets 142 (*)    All other components within normal limits  ETHANOL - Abnormal; Notable for the following components:   Alcohol, Ethyl (B) 281 (*)    All other components within normal limits  PREGNANCY, URINE  RAPID URINE DRUG SCREEN, HOSP PERFORMED    EKG None  Radiology Dg Shoulder Right  Result Date: 08/08/2018 CLINICAL DATA:  Initial evaluation for acute right shoulder pain, recent fall 2 days ago. History of recent fracture, s/p ORIF. EXAM: RIGHT SHOULDER - 2+ VIEW COMPARISON:  Previous radiograph from 04/23/2018 and intraoperative study from 05/02/2018 FINDINGS: Sequelae of prior ORIF at the proximal right humerus for previous comminuted proximal right humeral fracture. Lateral malleable plate screw fixation in place. No periprosthetic lucency to suggest loosening or failure. Prior fracture in stable alignment with evidence for interval healing callus formation. No new fracture identified. AC joint remains approximated. Glenoid intact. Mild osteoarthritic spurring noted at the [facility]. No acute soft tissue abnormality. IMPRESSION: 1. No acute osseous abnormality about the right shoulder. 2. Sequelae of prior ORIF for recent proximal right humeral fracture without complication. Prior humeral fracture in stable alignment with evidence for interval healing. Electronically Signed   By: Rise MuBenjamin   McClintock M.D.   On: 08/08/2018 03:14   Dg Knee Complete 4 Views Right  Result Date: 08/08/2018 CLINICAL DATA:  Initial evaluation for acute trauma, fall. EXAM: RIGHT KNEE - COMPLETE 4+ VIEW COMPARISON:  None. FINDINGS: No acute fracture dislocation. No joint effusion. Joint spaces fairly well-maintained without evidence for significant degenerative or erosive arthropathy. Osseous mineralization normal. No soft tissue abnormality. IMPRESSION: No acute osseous abnormality about the right knee. Electronically  Signed   By: Rise Mu M.D.   On: 08/08/2018 03:16    Procedures Procedures (including critical care time)  Medications Ordered in ED Medications  LORazepam (ATIVAN) injection 0-4 mg (2 mg Intravenous Given 08/08/18 0233)    Or  LORazepam (ATIVAN) tablet 0-4 mg ( Oral See Alternative 08/08/18 0233)  LORazepam (ATIVAN) injection 0-4 mg (has no administration in time range)    Or  LORazepam (ATIVAN) tablet 0-4 mg (has no administration in time range)  thiamine (VITAMIN B-1) tablet 100 mg ( Oral See Alternative 08/08/18 0229)    Or  thiamine (B-1) injection 100 mg (100 mg Intravenous Given 08/08/18 0229)  chlordiazePOXIDE (LIBRIUM) capsule 25 mg (has no administration in time range)  promethazine (PHENERGAN) tablet 25 mg (25 mg Oral Given 08/08/18 0317)     Initial Impression / Assessment and Plan / ED Course  I have reviewed the triage vital signs and the nursing notes.  Pertinent labs & imaging results that were available during my care of the patient were reviewed by me and considered in my medical decision making (see chart for details).  Clinical Course as of Aug 08 325  Mon Aug 08, 2018  0300 Inform patient that her blood alcohol level was greater than 3 times the legal limit.  Symptoms may be related to acute intoxication.  Her lab work is not significantly unchanged.  She does have slightly higher anion gap which could reflect some mild AKA.  She is tolerating fluids  without difficulty.  We discussed them to control.  Patient was given a dose of Librium.  She is interested in outpatient resources and a Librium taper.  She has taken Librium before and understands the risk of taking Librium with alcohol.   [CH]    Clinical Course User Index [CH] Horton, Mayer Masker, MD    Patient presents with concerns for withdrawal.  She is overall nontoxic and initial vital signs are largely reassuring.  No objective findings of withdrawal on exam.  She did score 10 on CIWA score.  However, when lab work returned, blood alcohol level noted to be 281.  Suspect her symptoms are related to acute alcohol intoxication.  See clinical course above.  Patient requesting discharge.  I reviewed her lab work and imaging.  No evidence of acute fractures or injury from x-rays.  She was given outpatient resources and a Librium taper.  After history, exam, and medical workup I feel the patient has been appropriately medically screened and is safe for discharge home. Pertinent diagnoses were discussed with the patient. Patient was given return precautions.   Final Clinical Impressions(s) / ED Diagnoses   Final diagnoses:  Acute alcoholic intoxication without complication (HCC)  Contusion of right shoulder, initial encounter  Contusion of right knee, initial encounter    ED Discharge Orders         Ordered    chlordiazePOXIDE (LIBRIUM) 25 MG capsule     08/08/18 0324    promethazine (PHENERGAN) 25 MG tablet  Every 6 hours PRN     08/08/18 0324           Shon Baton, MD 08/08/18 [phone]

## 2018-08-08 NOTE — ED Notes (Signed)
Patient verbalizes understanding of discharge instructions. Opportunity for questioning and answers were provided. Armband removed by staff, pt discharged from ED home via POV with family. 

## 2018-09-18 ENCOUNTER — Emergency Department (HOSPITAL_BASED_OUTPATIENT_CLINIC_OR_DEPARTMENT_OTHER)

## 2018-09-18 ENCOUNTER — Encounter (HOSPITAL_BASED_OUTPATIENT_CLINIC_OR_DEPARTMENT_OTHER): Payer: Self-pay | Admitting: *Deleted

## 2018-09-18 ENCOUNTER — Other Ambulatory Visit: Payer: Self-pay

## 2018-09-18 ENCOUNTER — Emergency Department (HOSPITAL_BASED_OUTPATIENT_CLINIC_OR_DEPARTMENT_OTHER)
Admission: EM | Admit: 2018-09-18 | Discharge: 2018-09-19 | Disposition: A | Discharge status: Home / Self Care | Attending: Emergency Medicine | Admitting: Emergency Medicine

## 2018-09-18 DIAGNOSIS — Z91040 Latex allergy status: Secondary | ICD-10-CM | POA: Diagnosis not present

## 2018-09-18 DIAGNOSIS — R05 Cough: Secondary | ICD-10-CM

## 2018-09-18 DIAGNOSIS — Z79899 Other long term (current) drug therapy: Secondary | ICD-10-CM | POA: Diagnosis not present

## 2018-09-18 DIAGNOSIS — F17210 Nicotine dependence, cigarettes, uncomplicated: Secondary | ICD-10-CM | POA: Diagnosis not present

## 2018-09-18 DIAGNOSIS — N3000 Acute cystitis without hematuria: Secondary | ICD-10-CM | POA: Insufficient documentation

## 2018-09-18 DIAGNOSIS — E039 Hypothyroidism, unspecified: Secondary | ICD-10-CM | POA: Diagnosis not present

## 2018-09-18 DIAGNOSIS — R059 Cough, unspecified: Secondary | ICD-10-CM

## 2018-09-18 LAB — URINALYSIS, ROUTINE W REFLEX MICROSCOPIC
Bilirubin Urine: NEGATIVE
Glucose, UA: NEGATIVE mg/dL
Ketones, ur: NEGATIVE mg/dL
Nitrite: NEGATIVE
Protein, ur: NEGATIVE mg/dL
pH: 6.5 (ref 5.0–8.0)

## 2018-09-18 LAB — URINALYSIS, MICROSCOPIC (REFLEX): WBC, UA: >50 WBC/hpf (ref 0–5)

## 2018-09-18 MED ORDER — CEPHALEXIN 500 MG PO CAPS: 500.0000 mg | ORAL_CAPSULE | Freq: Four times a day (QID) | ORAL | 0 refills | Status: AC

## 2018-09-18 MED ORDER — BENZONATATE 100 MG PO CAPS: 100.0000 mg | ORAL_CAPSULE | Freq: Three times a day (TID) | ORAL | 0 refills | Status: DC

## 2018-09-18 MED ORDER — PREDNISONE 20 MG PO TABS: 40.0000 mg | ORAL_TABLET | Freq: Every day | ORAL | 0 refills | Status: AC

## 2018-09-18 MED ORDER — GUAIFENESIN 100 MG/5ML PO LIQD: 100.0000 mg | Freq: Four times a day (QID) | ORAL | 0 refills | Status: DC | PRN

## 2018-09-18 MED ORDER — IPRATROPIUM-ALBUTEROL 0.5-2.5 (3) MG/3ML IN SOLN
3.0000 mL | Freq: Four times a day (QID) | RESPIRATORY_TRACT | Status: DC
  Administered 2018-09-18: 3 mL via RESPIRATORY_TRACT
  Filled 2018-09-18: qty 3

## 2018-09-18 MED ORDER — KETOROLAC TROMETHAMINE 15 MG/ML IJ SOLN
15.0000 mg | Freq: Once | INTRAMUSCULAR | Status: AC
  Administered 2018-09-19: 15 mg via INTRAMUSCULAR
  Filled 2018-09-18: qty 1

## 2018-09-18 MED ORDER — ALBUTEROL SULFATE HFA 108 (90 BASE) MCG/ACT IN AERS
2.0000 | INHALATION_SPRAY | RESPIRATORY_TRACT | Status: DC
  Administered 2018-09-19: 2 via RESPIRATORY_TRACT
  Filled 2018-09-18: qty 6.7

## 2018-09-18 NOTE — ED Notes (Signed)
Beverage and crackers provided. 

## 2018-09-18 NOTE — ED Provider Notes (Signed)
[facility] EMERGENCY DEPARTMENT Provider Note   CSN: [id number] Arrival date & time: 09/18/18  2041     History   Chief Complaint Chief Complaint  Patient presents with  . Cough  . Dysuria    HPI Julie Conley is a 43 y.o. female for evaluation of cough and urinary symptoms.  Patient states that the past 2 weeks, she has been having a nonproductive cough.  She reports associated nasal congestion and chest tightness.  Over the past 2 days, she reports worsening urinary symptoms.  She reports dysuria and abnormal odor.  She has increasing abdominal pain, nausea, and vomiting.  She reports diarrhea began today.  She has a history of UTIs, states this feels like her UTIs.  She has a history of gastric bypass and ablation for interstitial cystitis.  She has been taking ibuprofen, DayQuil, NyQuil, and TheraFlu without improvement of symptoms.  She reports mild fevers, denies nasal congestion, ear pain, sore throat, chest pain.  HPI  Past Medical History:  Diagnosis Date  . Alcohol abuse   . Anxiety   . Bimalleolar fracture of left ankle 04/2015  . Complication of anesthesia    hypotension with breast augmentation  . Depression   . GERD (gastroesophageal reflux disease)   . Headache    "once/week" (03/20/2016)  . Hypothyroidism 2010-2012   "stopped RX in 2012 when I didn't meet standards" (03/20/2016)  . Iron deficiency anemia   . Migraines    "once/month" (03/20/2016)  . Pneumonia "several times"  . Sciatic pain 04/12/2015   left    Patient Active Problem List   Diagnosis Date Noted  . GERD (gastroesophageal reflux disease) 07/24/2017  . Alcohol abuse   . 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 Pressure injury of skin 07/21/2017  . Acute encephalopathy 07/20/2017  . Thrombocytopenia (HCC) 06/14/2017  . Alcohol withdrawal (HCC) 06/14/2017  . 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 Alcohol withdrawal syndrome without complication (HCC) 04/24/2016  . Sinus  tachycardia 04/24/2016  . Normocytic anemia 04/24/2016  . Alcoholic ketoacidosis 03/20/2016  . Chest pain 03/20/2016  . Alcohol consumption binge drinking 03/20/2016  . Depression 03/20/2016  . Gastritis 03/20/2016  . 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 Elevated lipase   . Hypokalemia 01/20/2016  . Hyperglycemia 01/20/2016  . Hyponatremia 01/20/2016  . Alcohol dependence with withdrawal with complication (HCC) 11/05/2014  . 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 Hypothyroidism 11/20/2011    Past Surgical History:  Procedure Laterality Date  . ABDOMINOPLASTY  2015  . APPENDECTOMY  ~ 1986  . AUGMENTATION MAMMAPLASTY  2015  . CESAREAN SECTION  1996; 2000  . CYSTOSCOPY WITH HYDRODISTENSION AND BIOPSY  2010  . DILATION AND CURETTAGE OF UTERUS  2010  . ENDOMETRIAL ABLATION  2010  . FRACTURE SURGERY    . LAPAROSCOPIC CHOLECYSTECTOMY  2006  . LAPAROSCOPIC TUBAL LIGATION  2001   w/banding  . LAPAROTOMY  2011  . NASAL SEPTOPLASTY W/ TURBINOPLASTY  1991; 2008  . ORIF ANKLE FRACTURE Left 04/15/2015   Procedure: OPEN REDUCTION INTERNAL FIXATION (ORIF) LEFT BIMALLEOLAR AND SYNDESMOSIS ANKLE FRACTURE;  Surgeon: Sheral Apley, MD;  Location: [facility];  Service: Orthopedics;  Laterality: Left;  . ROUX-EN-Y GASTRIC BYPASS  2009  . SHOULDER ARTHROSCOPY    . TONSILLECTOMY  ~ 1981  . VAGINAL HYSTERECTOMY  2013   complete     OB History   No obstetric history on file.      Home Medications  Prior to Admission medications   Medication Sig Start Date End Date Taking? Authorizing Provider  benzonatate (TESSALON) 100 MG capsule Take 1 capsule (100 mg total) by mouth every 8 (eight) hours. 09/18/18   Contessa Preuss, PA-C  cephALEXin (KEFLEX) 500 MG capsule Take 1 capsule (500 mg total) by mouth 4 (four) times daily for 5 days. 09/18/18 09/23/18  Jushua Waltman, PA-C  chlordiazePOXIDE (LIBRIUM) 25 MG capsule 50mg  PO TID x 1D, then 25-50mg  PO BID X 1D, then 25-50mg   PO QD X 1D 08/08/18   Horton, Mayer Maskerourtney F, MD  doxycycline (VIBRAMYCIN) 100 MG capsule Take 1 capsule (100 mg total) by mouth 2 (two) times daily. 10/06/17   Vanetta MuldersZackowski, Scott, MD  DULoxetine (CYMBALTA) 30 MG capsule Take 1 capsule (30 mg total) by mouth daily. 07/27/17   Roberto ScalesNettey, Shayla D, MD  erythromycin ophthalmic ointment Place a 1/2 inch ribbon of ointment into the right lower eyelid TID 10/06/17   Vanetta MuldersZackowski, Scott, MD  folic acid (FOLVITE) 1 MG tablet Take 1 tablet (1 mg total) by mouth daily. 07/27/17   Laverna PeaceNettey, Shayla D, MD  gabapentin (NEURONTIN) 300 MG capsule Take 1 capsule (300 mg total) by mouth 3 (three) times daily. 07/27/17   Laverna PeaceNettey, Shayla D, MD  guaiFENesin (ROBITUSSIN) 100 MG/5ML liquid Take 5-10 mLs (100-200 mg total) by mouth every 6 (six) hours as needed for cough. 09/18/18   Katsumi Wisler, PA-C  Hydrocortisone (GERHARDT'S BUTT CREAM) CREA Apply 1 application topically 4 (four) times daily. 07/27/17   Laverna PeaceNettey, Shayla D, MD  levothyroxine (SYNTHROID, LEVOTHROID) 50 MCG tablet Take 1 tablet (50 mcg total) by mouth daily before breakfast. 07/28/17   Roberto ScalesNettey, Shayla D, MD  metoprolol tartrate (LOPRESSOR) 25 MG tablet Take 1 tablet (25 mg total) by mouth 2 (two) times daily. 07/27/17   Laverna PeaceNettey, Shayla D, MD  pantoprazole (PROTONIX) 40 MG tablet Take 1 tablet (40 mg total) by mouth daily. 07/27/17   Laverna PeaceNettey, Shayla D, MD  predniSONE (DELTASONE) 20 MG tablet Take 2 tablets (40 mg total) by mouth daily for 5 days. 09/18/18 09/23/18  Anthonee Gelin, PA-C  promethazine (PHENERGAN) 25 MG tablet Take 1 tablet (25 mg total) by mouth every 6 (six) hours as needed for nausea or vomiting. 08/08/18   Horton, Mayer Maskerourtney F, MD  thiamine 100 MG tablet Take 1 tablet (100 mg total) by mouth daily. 07/27/17   Laverna PeaceNettey, Shayla D, MD    Family History Family History  Problem Relation Age of Onset  . Drug abuse Brother   . Alcohol abuse Father   . Drug abuse Mother     Social History Social History    Tobacco Use  . Smoking status: Current Every Day Smoker    Packs/day: 0.25    Years: 0.50    Pack years: 0.12    Types: Cigarettes  . Smokeless tobacco: Never Used  Substance Use Topics  . Alcohol use: Not Currently    Comment: states occasional binge drinking  . Drug use: No     Allergies   Adhesive [tape]; Ambien [zolpidem tartrate]; Zofran [ondansetron]; Latex; and Sulfa antibiotics   Review of Systems Review of Systems  HENT: Positive for congestion.   Respiratory: Positive for cough.   Gastrointestinal: Positive for diarrhea and nausea.  Genitourinary: Positive for dysuria and frequency.  All other systems reviewed and are negative.    Physical Exam Updated Vital Signs BP 127/75 (BP Location: Left Arm)   Pulse (!) 109   Temp 99.9 F (37.7 C) (Oral)  Resp 18   Ht 5\' 4"  (1.626 m)   Wt 79.4 kg   SpO2 100%   BMI 30.04 kg/m   Physical Exam Vitals signs and nursing note reviewed.  Constitutional:      General: She is not in acute distress.    Appearance: She is well-developed.     Comments: Sitting in the bed in no acute distress  HENT:     Head: Normocephalic and atraumatic.     Comments: Nasal mucosal edema.  OP clear without tonsillar swelling or exudate.  Uvula midline with equal palate rise.  TMs nonerythematous nonbulging bilaterally.    Nose: Mucosal edema and congestion present.  Eyes:     Extraocular Movements: Extraocular movements intact.     Conjunctiva/sclera: Conjunctivae normal.     Pupils: Pupils are equal, round, and reactive to light.  Neck:     Musculoskeletal: Normal range of motion and neck supple.  Cardiovascular:     Rate and Rhythm: Regular rhythm. Tachycardia present.     Pulses: Normal pulses.     Comments: Mildly tachycardic around 105 Pulmonary:     Effort: Pulmonary effort is normal. No respiratory distress.     Breath sounds: Normal breath sounds. No wheezing.     Comments: speaking in full sentences.  Clear lung sounds  in all fields. Abdominal:     General: There is no distension.     Palpations: Abdomen is soft. There is no mass.     Tenderness: There is no abdominal tenderness. There is no right CVA tenderness, left CVA tenderness, guarding or rebound.     Comments: No CVA tenderness  Musculoskeletal: Normal range of motion.  Skin:    General: Skin is warm and dry.     Capillary Refill: Capillary refill takes less than 2 seconds.  Neurological:     Mental Status: She is alert and oriented to person, place, and time.      ED Treatments / Results  Labs (all labs ordered are listed, but only abnormal results are displayed) Labs Reviewed  URINALYSIS, ROUTINE W REFLEX MICROSCOPIC - Abnormal; Notable for the following components:      Result Value   APPearance CLOUDY (*)    Specific Gravity, Urine <1.005 (*)    Hgb urine dipstick MODERATE (*)    Leukocytes, UA LARGE (*)    All other components within normal limits  URINALYSIS, MICROSCOPIC (REFLEX) - Abnormal; Notable for the following components:   Bacteria, UA MANY (*)    All other components within normal limits    EKG None  Radiology Dg Chest 2 View  Result Date: 09/18/2018 CLINICAL DATA:  Cough and fever for 2 weeks EXAM: CHEST - 2 VIEW COMPARISON:  04/28/2018 FINDINGS: Normal heart size and pulmonary vascularity. No focal airspace disease or consolidation in the lungs. No blunting of costophrenic angles. No pneumothorax. Mediastinal contours appear intact. Postoperative changes in the right shoulder. Surgical clips in the left upper quadrant. IMPRESSION: No active cardiopulmonary disease. Electronically Signed   By: Burman NievesWilliam  Stevens M.D.   On: 09/18/2018 21:48    Procedures Procedures (including critical care time)  Medications Ordered in ED Medications  ipratropium-albuterol (DUONEB) 0.5-2.5 (3) MG/3ML nebulizer solution 3 mL (3 mLs Nebulization Given 09/18/18 2312)  albuterol (PROVENTIL HFA;VENTOLIN HFA) 108 (90 Base) MCG/ACT  inhaler 2 puff (2 puffs Inhalation Given 09/19/18 0004)  ketorolac (TORADOL) 15 MG/ML injection 15 mg (15 mg Intramuscular Given 09/19/18 0004)     Initial Impression / Assessment and  Plan / ED Course  I have reviewed the triage vital signs and the nursing notes.  Pertinent labs & imaging results that were available during my care of the patient were reviewed by me and considered in my medical decision making (see chart for details).     Pt presenting for evaluation of URI symptoms and urinary symptoms.  URI symptoms have been present for the past 2 weeks.  She reports cough, chest tightness, and nasal congestion.  Exam reassuring, she appears nontoxic.  No concerning pulmonary sounds.  However, symptoms have been present for the past several weeks, will obtain x-ray for further evaluation. Additionally, patient with urinary symptoms for the past 2 days.  reports history of frequent UTIs.  Will obtain urine.  X-ray viewed interpreted by me, no pneumonia, pneumothorax, effusion.  Will trial breathing treatment and reassess.  Urine positive for infection.  On reassessment, patient reports chest tightness improved.  She remains mildly tachycardic, although is also visibly jittery due to albuterol.  Additionally, has low-grade fever, I believe this is playing a role in her tachycardia.  Low suspicion for PE at this time in the setting of nasal congestion and other URI symptoms.  She is tolerating p.o. without signs of abdominal pain, nausea, vomiting.  Low suspicion for Pyelo at this time.  Will discharge with antibiotics for UTI, prednisone, albuterol, and cough medicine for presumed bronchitis.  Encouraged to follow-up with her PCP.  At this time, patient appears safe for discharge.  Return precautions given.  Patient states she understands and agrees to plan.   Final Clinical Impressions(s) / ED Diagnoses   Final diagnoses:  Cough  Acute cystitis without hematuria    ED Discharge Orders          Ordered    cephALEXin (KEFLEX) 500 MG capsule  4 times daily     09/18/18 2353    predniSONE (DELTASONE) 20 MG tablet  Daily     09/18/18 2353    benzonatate (TESSALON) 100 MG capsule  Every 8 hours     09/18/18 2359    guaiFENesin (ROBITUSSIN) 100 MG/5ML liquid  Every 6 hours PRN     09/18/18 2359           Sharis Keeran, PA-C 09/19/18 0110    Doug Sou, MD 09/19/18 1056

## 2018-09-18 NOTE — ED Triage Notes (Addendum)
Pt also mentions she has dysuria and hx of UTI

## 2018-09-18 NOTE — ED Triage Notes (Signed)
Cough and fever x 2 weeks

## 2018-09-18 NOTE — Discharge Instructions (Addendum)
Take antibiotics as prescribed.  Take the entire course, even if your symptoms improve. Use the inhaler every 4 hours for the next 2 days.  After this, only as needed for shortness of breath or chest tightness. Take prednisone as prescribed. Use cough drops and syrups as needed for cough.  Follow up with your primary care doctor recheck of symptoms. Return to the emergency room with any new, worsening, concerning symptoms.

## 2018-09-18 NOTE — ED Notes (Signed)
ED Provider at bedside. 

## 2018-11-11 ENCOUNTER — Emergency Department (HOSPITAL_BASED_OUTPATIENT_CLINIC_OR_DEPARTMENT_OTHER)

## 2018-11-11 ENCOUNTER — Other Ambulatory Visit: Payer: Self-pay

## 2018-11-11 ENCOUNTER — Encounter (HOSPITAL_BASED_OUTPATIENT_CLINIC_OR_DEPARTMENT_OTHER): Payer: Self-pay | Admitting: Emergency Medicine

## 2018-11-11 ENCOUNTER — Inpatient Hospital Stay (HOSPITAL_BASED_OUTPATIENT_CLINIC_OR_DEPARTMENT_OTHER)
Admission: EM | Admit: 2018-11-11 | Discharge: 2018-11-15 | DRG: 897 | Disposition: A | Discharge status: Home / Self Care | Attending: Internal Medicine | Admitting: Internal Medicine

## 2018-11-11 DIAGNOSIS — F17210 Nicotine dependence, cigarettes, uncomplicated: Secondary | ICD-10-CM | POA: Diagnosis present

## 2018-11-11 DIAGNOSIS — E876 Hypokalemia: Secondary | ICD-10-CM | POA: Diagnosis present

## 2018-11-11 DIAGNOSIS — R0683 Snoring: Secondary | ICD-10-CM | POA: Diagnosis not present

## 2018-11-11 DIAGNOSIS — Z811 Family history of alcohol abuse and dependence: Secondary | ICD-10-CM

## 2018-11-11 DIAGNOSIS — E039 Hypothyroidism, unspecified: Secondary | ICD-10-CM | POA: Diagnosis present

## 2018-11-11 DIAGNOSIS — F329 Major depressive disorder, single episode, unspecified: Secondary | ICD-10-CM | POA: Diagnosis present

## 2018-11-11 DIAGNOSIS — F419 Anxiety disorder, unspecified: Secondary | ICD-10-CM

## 2018-11-11 DIAGNOSIS — F4310 Post-traumatic stress disorder, unspecified: Secondary | ICD-10-CM | POA: Diagnosis not present

## 2018-11-11 DIAGNOSIS — R440 Auditory hallucinations: Secondary | ICD-10-CM | POA: Diagnosis not present

## 2018-11-11 DIAGNOSIS — F10229 Alcohol dependence with intoxication, unspecified: Secondary | ICD-10-CM | POA: Diagnosis not present

## 2018-11-11 DIAGNOSIS — F10231 Alcohol dependence with withdrawal delirium: Secondary | ICD-10-CM | POA: Diagnosis not present

## 2018-11-11 DIAGNOSIS — R441 Visual hallucinations: Secondary | ICD-10-CM | POA: Diagnosis present

## 2018-11-11 DIAGNOSIS — K219 Gastro-esophageal reflux disease without esophagitis: Secondary | ICD-10-CM | POA: Diagnosis present

## 2018-11-11 DIAGNOSIS — K2920 Alcoholic gastritis without bleeding: Secondary | ICD-10-CM | POA: Diagnosis present

## 2018-11-11 DIAGNOSIS — Z9049 Acquired absence of other specified parts of digestive tract: Secondary | ICD-10-CM

## 2018-11-11 DIAGNOSIS — M62838 Other muscle spasm: Secondary | ICD-10-CM | POA: Diagnosis present

## 2018-11-11 DIAGNOSIS — F10932 Alcohol use, unspecified with withdrawal with perceptual disturbance: Secondary | ICD-10-CM

## 2018-11-11 DIAGNOSIS — Y902 Blood alcohol level of 40-59 mg/100 ml: Secondary | ICD-10-CM | POA: Diagnosis present

## 2018-11-11 DIAGNOSIS — Z87440 Personal history of urinary (tract) infections: Secondary | ICD-10-CM

## 2018-11-11 DIAGNOSIS — D509 Iron deficiency anemia, unspecified: Secondary | ICD-10-CM | POA: Diagnosis present

## 2018-11-11 DIAGNOSIS — R7989 Other specified abnormal findings of blood chemistry: Secondary | ICD-10-CM | POA: Diagnosis present

## 2018-11-11 DIAGNOSIS — R945 Abnormal results of liver function studies: Secondary | ICD-10-CM | POA: Diagnosis present

## 2018-11-11 DIAGNOSIS — I10 Essential (primary) hypertension: Secondary | ICD-10-CM | POA: Diagnosis present

## 2018-11-11 DIAGNOSIS — R079 Chest pain, unspecified: Secondary | ICD-10-CM | POA: Diagnosis present

## 2018-11-11 DIAGNOSIS — Z9884 Bariatric surgery status: Secondary | ICD-10-CM

## 2018-11-11 DIAGNOSIS — K7010 Alcoholic hepatitis without ascites: Secondary | ICD-10-CM | POA: Diagnosis present

## 2018-11-11 DIAGNOSIS — F10239 Alcohol dependence with withdrawal, unspecified: Principal | ICD-10-CM | POA: Diagnosis present

## 2018-11-11 DIAGNOSIS — D61818 Other pancytopenia: Secondary | ICD-10-CM | POA: Diagnosis not present

## 2018-11-11 DIAGNOSIS — G4700 Insomnia, unspecified: Secondary | ICD-10-CM | POA: Diagnosis present

## 2018-11-11 DIAGNOSIS — E871 Hypo-osmolality and hyponatremia: Secondary | ICD-10-CM | POA: Diagnosis present

## 2018-11-11 DIAGNOSIS — Z62810 Personal history of physical and sexual abuse in childhood: Secondary | ICD-10-CM | POA: Diagnosis not present

## 2018-11-11 DIAGNOSIS — Z813 Family history of other psychoactive substance abuse and dependence: Secondary | ICD-10-CM

## 2018-11-11 DIAGNOSIS — F10232 Alcohol dependence with withdrawal with perceptual disturbance: Secondary | ICD-10-CM

## 2018-11-11 DIAGNOSIS — F1010 Alcohol abuse, uncomplicated: Secondary | ICD-10-CM | POA: Diagnosis present

## 2018-11-11 DIAGNOSIS — Z79890 Hormone replacement therapy (postmenopausal): Secondary | ICD-10-CM

## 2018-11-11 DIAGNOSIS — F32A Depression, unspecified: Secondary | ICD-10-CM | POA: Diagnosis present

## 2018-11-11 DIAGNOSIS — F10939 Alcohol use, unspecified with withdrawal, unspecified: Secondary | ICD-10-CM | POA: Diagnosis present

## 2018-11-11 DIAGNOSIS — Z90710 Acquired absence of both cervix and uterus: Secondary | ICD-10-CM

## 2018-11-11 DIAGNOSIS — F10931 Alcohol use, unspecified with withdrawal delirium: Secondary | ICD-10-CM | POA: Diagnosis present

## 2018-11-11 DIAGNOSIS — Z79899 Other long term (current) drug therapy: Secondary | ICD-10-CM | POA: Diagnosis not present

## 2018-11-11 LAB — CBC WITH DIFFERENTIAL/PLATELET
Abs Immature Granulocytes: 0.03 10*3/uL (ref 0.00–0.07)
Basophils Absolute: 0 10*3/uL (ref 0.0–0.1)
Basophils Relative: 1 %
Eosinophils Absolute: 0 10*3/uL (ref 0.0–0.5)
Eosinophils Relative: 1 %
HCT: 34.7 % — ABNORMAL LOW (ref 36.0–46.0)
Hemoglobin: 11.4 g/dL — ABNORMAL LOW (ref 12.0–15.0)
IMMATURE GRANULOCYTES: 1 %
Lymphocytes Relative: 25 %
Lymphs Abs: 1.6 10*3/uL (ref 0.7–4.0)
MCH: 27.1 pg (ref 26.0–34.0)
MCHC: 32.9 g/dL (ref 30.0–36.0)
MCV: 82.6 fL (ref 80.0–100.0)
Monocytes Absolute: 0.2 10*3/uL (ref 0.1–1.0)
Monocytes Relative: 4 %
NRBC: 0 % (ref 0.0–0.2)
Neutro Abs: 4.6 10*3/uL (ref 1.7–7.7)
Neutrophils Relative %: 68 %
Platelets: 137 10*3/uL — ABNORMAL LOW (ref 150–400)
RBC: 4.2 MIL/uL (ref 3.87–5.11)
RDW: 21.8 % — ABNORMAL HIGH (ref 11.5–15.5)
WBC: 6.6 10*3/uL (ref 4.0–10.5)

## 2018-11-11 LAB — CBC
HEMATOCRIT: 31.9 % — AB (ref 36.0–46.0)
Hemoglobin: 10.1 g/dL — ABNORMAL LOW (ref 12.0–15.0)
MCH: 27.2 pg (ref 26.0–34.0)
MCHC: 31.7 g/dL (ref 30.0–36.0)
MCV: 86 fL (ref 80.0–100.0)
Platelets: 87 10*3/uL — ABNORMAL LOW (ref 150–400)
RBC: 3.71 MIL/uL — ABNORMAL LOW (ref 3.87–5.11)
RDW: 21.9 % — ABNORMAL HIGH (ref 11.5–15.5)
WBC: 3.3 10*3/uL — ABNORMAL LOW (ref 4.0–10.5)
nRBC: 0 % (ref 0.0–0.2)

## 2018-11-11 LAB — MRSA PCR SCREENING: MRSA by PCR: NEGATIVE

## 2018-11-11 LAB — COMPREHENSIVE METABOLIC PANEL
ALT: 73 U/L — ABNORMAL HIGH (ref 0–44)
AST: 158 U/L — ABNORMAL HIGH (ref 15–41)
Albumin: 4.4 g/dL (ref 3.5–5.0)
Alkaline Phosphatase: 110 U/L (ref 38–126)
Anion gap: 20 — ABNORMAL HIGH (ref 5–15)
BILIRUBIN TOTAL: 0.6 mg/dL (ref 0.3–1.2)
BUN: 6 mg/dL (ref 6–20)
CHLORIDE: 86 mmol/L — AB (ref 98–111)
CO2: 20 mmol/L — ABNORMAL LOW (ref 22–32)
Calcium: 8.7 mg/dL — ABNORMAL LOW (ref 8.9–10.3)
Creatinine, Ser: 0.6 mg/dL (ref 0.44–1.00)
GFR calc Af Amer: >60 mL/min (ref >60)
GFR calc non Af Amer: >60 mL/min (ref >60)
Glucose, Bld: 90 mg/dL (ref 70–99)
POTASSIUM: 3.8 mmol/L (ref 3.5–5.1)
Sodium: 126 mmol/L — ABNORMAL LOW (ref 135–145)
TOTAL PROTEIN: 7.8 g/dL (ref 6.5–8.1)

## 2018-11-11 LAB — RAPID URINE DRUG SCREEN, HOSP PERFORMED
AMPHETAMINES: NOT DETECTED
Barbiturates: NOT DETECTED
Benzodiazepines: NOT DETECTED
Cocaine: NOT DETECTED
Opiates: NOT DETECTED
Tetrahydrocannabinol: NOT DETECTED

## 2018-11-11 LAB — HIV ANTIBODY (ROUTINE TESTING W REFLEX): HIV Screen 4th Generation wRfx: NONREACTIVE

## 2018-11-11 LAB — TROPONIN I: Troponin I: <0.03 ng/mL (ref <0.03)

## 2018-11-11 LAB — CREATININE, SERUM
Creatinine, Ser: 0.66 mg/dL (ref 0.44–1.00)
GFR calc Af Amer: >60 mL/min (ref >60)
GFR calc non Af Amer: >60 mL/min (ref >60)

## 2018-11-11 LAB — ETHANOL: ALCOHOL ETHYL (B): 41 mg/dL — AB (ref <10)

## 2018-11-11 MED ORDER — LORAZEPAM 2 MG/ML IJ SOLN
1.0000 mg | Freq: Once | INTRAMUSCULAR | Status: AC
  Administered 2018-11-11: 1 mg via INTRAVENOUS
  Filled 2018-11-11: qty 1

## 2018-11-11 MED ORDER — PANTOPRAZOLE SODIUM 40 MG PO TBEC
40.0000 mg | DELAYED_RELEASE_TABLET | Freq: Every day | ORAL | Status: DC
  Administered 2018-11-12 – 2018-11-15 (×4): 40 mg via ORAL
  Filled 2018-11-11 (×4): qty 1

## 2018-11-11 MED ORDER — SODIUM CHLORIDE 0.9 % IV BOLUS (SEPSIS)
1000.0000 mL | Freq: Once | INTRAVENOUS | Status: AC
  Administered 2018-11-11: 1000 mL via INTRAVENOUS

## 2018-11-11 MED ORDER — DULOXETINE HCL 30 MG PO CPEP: 30.0000 mg | ORAL_CAPSULE | Freq: Every day | ORAL | Status: DC

## 2018-11-11 MED ORDER — ENOXAPARIN SODIUM 40 MG/0.4ML ~~LOC~~ SOLN
40.0000 mg | SUBCUTANEOUS | Status: DC
  Administered 2018-11-11 – 2018-11-13 (×3): 40 mg via SUBCUTANEOUS
  Filled 2018-11-11 (×3): qty 0.4

## 2018-11-11 MED ORDER — METOPROLOL TARTRATE 25 MG PO TABS: 25.0000 mg | ORAL_TABLET | Freq: Two times a day (BID) | ORAL | Status: DC

## 2018-11-11 MED ORDER — LORAZEPAM 2 MG/ML IJ SOLN
0.0000 mg | Freq: Two times a day (BID) | INTRAMUSCULAR | Status: DC
  Administered 2018-11-13: 2 mg via INTRAVENOUS
  Filled 2018-11-11: qty 1

## 2018-11-11 MED ORDER — SODIUM CHLORIDE 0.9 % IV BOLUS
1000.0000 mL | Freq: Once | INTRAVENOUS | Status: AC
  Administered 2018-11-11: 1000 mL via INTRAVENOUS

## 2018-11-11 MED ORDER — LORAZEPAM 2 MG/ML IJ SOLN
1.0000 mg | INTRAMUSCULAR | Status: DC | PRN
  Administered 2018-11-11 (×3): 1 mg via INTRAVENOUS
  Administered 2018-11-12: 2 mg via INTRAVENOUS
  Administered 2018-11-13 (×2): 1 mg via INTRAVENOUS
  Administered 2018-11-14 – 2018-11-15 (×4): 2 mg via INTRAVENOUS
  Filled 2018-11-11 (×11): qty 1

## 2018-11-11 MED ORDER — LORAZEPAM 2 MG/ML IJ SOLN
1.0000 mg | Freq: Once | INTRAMUSCULAR | Status: DC
  Filled 2018-11-11: qty 1

## 2018-11-11 MED ORDER — THIAMINE HCL 100 MG/ML IJ SOLN
100.0000 mg | Freq: Every day | INTRAMUSCULAR | Status: DC
  Administered 2018-11-11 – 2018-11-12 (×2): 100 mg via INTRAVENOUS
  Filled 2018-11-11 (×2): qty 2

## 2018-11-11 MED ORDER — ACETAMINOPHEN 325 MG PO TABS
650.0000 mg | ORAL_TABLET | Freq: Four times a day (QID) | ORAL | Status: DC | PRN
  Administered 2018-11-12: 650 mg via ORAL
  Filled 2018-11-11: qty 2

## 2018-11-11 MED ORDER — SODIUM CHLORIDE 0.9 % IV SOLN
INTRAVENOUS | Status: DC
  Administered 2018-11-11 – 2018-11-13 (×6): via INTRAVENOUS

## 2018-11-11 MED ORDER — LORAZEPAM 1 MG PO TABS: 0.0000 mg | ORAL_TABLET | Freq: Four times a day (QID) | ORAL | Status: DC

## 2018-11-11 MED ORDER — PROMETHAZINE HCL 25 MG/ML IJ SOLN
25.0000 mg | Freq: Four times a day (QID) | INTRAMUSCULAR | Status: DC | PRN
  Administered 2018-11-11 – 2018-11-15 (×6): 25 mg via INTRAVENOUS
  Filled 2018-11-11 (×7): qty 1

## 2018-11-11 MED ORDER — PROMETHAZINE HCL 25 MG/ML IJ SOLN
25.0000 mg | Freq: Once | INTRAMUSCULAR | Status: AC
  Administered 2018-11-11: 25 mg via INTRAVENOUS
  Filled 2018-11-11: qty 1

## 2018-11-11 MED ORDER — ACETAMINOPHEN 650 MG RE SUPP: 650.0000 mg | Freq: Four times a day (QID) | RECTAL | Status: DC | PRN

## 2018-11-11 MED ORDER — KETOROLAC TROMETHAMINE 30 MG/ML IJ SOLN
30.0000 mg | Freq: Once | INTRAMUSCULAR | Status: AC
  Administered 2018-11-11: 30 mg via INTRAVENOUS
  Filled 2018-11-11: qty 1

## 2018-11-11 MED ORDER — FOLIC ACID 1 MG PO TABS
1.0000 mg | ORAL_TABLET | Freq: Every day | ORAL | Status: DC
  Administered 2018-11-12 – 2018-11-15 (×4): 1 mg via ORAL
  Filled 2018-11-11 (×4): qty 1

## 2018-11-11 MED ORDER — THIAMINE HCL 100 MG/ML IJ SOLN
100.0000 mg | Freq: Once | INTRAMUSCULAR | Status: AC
  Administered 2018-11-11: 100 mg via INTRAVENOUS
  Filled 2018-11-11: qty 2

## 2018-11-11 MED ORDER — CHLORDIAZEPOXIDE HCL 5 MG PO CAPS
10.0000 mg | ORAL_CAPSULE | Freq: Three times a day (TID) | ORAL | Status: DC
  Administered 2018-11-11 – 2018-11-14 (×12): 10 mg via ORAL
  Filled 2018-11-11 (×5): qty 1
  Filled 2018-11-11 (×2): qty 2
  Filled 2018-11-11 (×6): qty 1

## 2018-11-11 MED ORDER — TRAZODONE HCL 50 MG PO TABS
50.0000 mg | ORAL_TABLET | Freq: Once | ORAL | Status: AC
  Administered 2018-11-11: 50 mg via ORAL
  Filled 2018-11-11: qty 1

## 2018-11-11 MED ORDER — SENNOSIDES-DOCUSATE SODIUM 8.6-50 MG PO TABS: 1.0000 | ORAL_TABLET | Freq: Every evening | ORAL | Status: DC | PRN

## 2018-11-11 MED ORDER — LORAZEPAM 2 MG/ML IJ SOLN
0.0000 mg | Freq: Four times a day (QID) | INTRAMUSCULAR | Status: DC
  Administered 2018-11-11 – 2018-11-12 (×4): 1 mg via INTRAVENOUS
  Administered 2018-11-12: 2 mg via INTRAVENOUS
  Administered 2018-11-12 (×2): 1 mg via INTRAVENOUS
  Filled 2018-11-11 (×7): qty 1

## 2018-11-11 MED ORDER — VITAMIN B-1 100 MG PO TABS
100.0000 mg | ORAL_TABLET | Freq: Every day | ORAL | Status: DC
  Administered 2018-11-13 – 2018-11-15 (×3): 100 mg via ORAL
  Filled 2018-11-11 (×4): qty 1

## 2018-11-11 MED ORDER — LORAZEPAM 1 MG PO TABS
0.0000 mg | ORAL_TABLET | Freq: Two times a day (BID) | ORAL | Status: DC
  Administered 2018-11-13: 2 mg via ORAL
  Filled 2018-11-11: qty 2

## 2018-11-11 MED ORDER — SODIUM CHLORIDE 0.9 % IV SOLN
INTRAVENOUS | Status: DC
  Administered 2018-11-11: 03:00:00 via INTRAVENOUS

## 2018-11-11 MED ORDER — ADULT MULTIVITAMIN W/MINERALS CH
1.0000 | ORAL_TABLET | Freq: Every day | ORAL | Status: DC
  Administered 2018-11-12 – 2018-11-15 (×4): 1 via ORAL
  Filled 2018-11-11 (×5): qty 1

## 2018-11-11 MED ORDER — SORBITOL 70 % SOLN
30.0000 mL | Freq: Every day | Status: DC | PRN
  Filled 2018-11-11: qty 30

## 2018-11-11 NOTE — ED Provider Notes (Signed)
TIME SEEN: 1:15 AM  CHIEF COMPLAINT: Alcohol withdrawal, chest pain  HPI: Patient is a 43 year old female with history of alcohol abuse who presents to the emergency department with symptoms of alcohol withdrawal.  States she has been drinking 2 bottles of wine a day for the past 2 weeks.  Previous to that she was sober for approximately 1 month.  Denies any drug use.  States her last drink was 3 PM yesterday March 5.  States around 5 PM she started feeling nauseated, having tremors, "seeing spots".  Started having chest pressure with shortness of breath.  No vomiting.  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  Denies drug use.  Denies SI or HI.  ROS: See HPI Constitutional: no fever  Eyes: no drainage  ENT: no runny nose   Cardiovascular:  chest pain  Resp:  SOB  GI: no vomiting GU: no dysuria Integumentary: no rash  Allergy: no hives  Musculoskeletal: no leg swelling  Neurological: no slurred speech ROS otherwise negative  PAST MEDICAL HISTORY/PAST SURGICAL HISTORY:  Past Medical History:  Diagnosis Date  . Alcohol abuse   . Anxiety   . Bimalleolar fracture of left ankle 04/2015  . Complication of anesthesia    hypotension with breast augmentation  . Depression   . GERD (gastroesophageal reflux disease)   . Headache    "once/week" (03/20/2016)  . Hypothyroidism 2010-2012   "stopped RX in 2012 when I didn't meet standards" (03/20/2016)  . Iron deficiency anemia   . Migraines    "once/month" (03/20/2016)  . Pneumonia "several times"  . Sciatic pain 04/12/2015   left    MEDICATIONS:  Prior to Admission medications   Medication Sig Start Date End Date Taking? Authorizing Provider  benzonatate (TESSALON) 100 MG capsule Take 1 capsule (100 mg total) by mouth every 8 (eight) hours. 09/18/18   Caccavale, Sophia, PA-C  chlordiazePOXIDE (LIBRIUM) 25 MG capsule 50mg  PO TID x 1D, then 25-50mg  PO BID X 1D, then 25-50mg  PO QD X 1D  08/08/18   Horton, Mayer Masker, MD  doxycycline (VIBRAMYCIN) 100 MG capsule Take 1 capsule (100 mg total) by mouth 2 (two) times daily. 10/06/17   Vanetta Mulders, MD  DULoxetine (CYMBALTA) 30 MG capsule Take 1 capsule (30 mg total) by mouth daily. 07/27/17   Roberto Scales D, MD  erythromycin ophthalmic ointment Place a 1/2 inch ribbon of ointment into the right lower eyelid TID 10/06/17   Vanetta Mulders, MD  folic acid (FOLVITE) 1 MG tablet Take 1 tablet (1 mg total) by mouth daily. 07/27/17   Laverna Peace, MD  gabapentin (NEURONTIN) 300 MG capsule Take 1 capsule (300 mg total) by mouth 3 (three) times daily. 07/27/17   Laverna Peace, MD  guaiFENesin (ROBITUSSIN) 100 MG/5ML liquid Take 5-10 mLs (100-200 mg total) by mouth every 6 (six) hours as needed for cough. 09/18/18   Caccavale, Sophia, PA-C  Hydrocortisone (GERHARDT'S BUTT CREAM) CREA Apply 1 application topically 4 (four) times daily. 07/27/17   Laverna Peace, MD  levothyroxine (SYNTHROID, LEVOTHROID) 50 MCG tablet Take 1 tablet (50 mcg total) by mouth daily before breakfast. 07/28/17   Roberto Scales D, MD  metoprolol tartrate (LOPRESSOR) 25 MG tablet Take 1 tablet (25 mg total) by mouth 2 (two) times daily. 07/27/17   Laverna Peace, MD  pantoprazole (PROTONIX) 40 MG tablet Take 1 tablet (40 mg total) by mouth daily. 07/27/17   Laverna Peace, MD  promethazine (  PHENERGAN) 25 MG tablet Take 1 tablet (25 mg total) by mouth every 6 (six) hours as needed for nausea or vomiting. 08/08/18   Horton, Mayer Maskerourtney F, MD  thiamine 100 MG tablet Take 1 tablet (100 mg total) by mouth daily. 07/27/17   Laverna PeaceNettey, Shayla D, MD    ALLERGIES:  Allergies  Allergen Reactions  . Adhesive [Tape] Other (See Comments)    TEARS SKIN  . Ambien [Zolpidem Tartrate] Other (See Comments)    Delirium, disorientation  . Zofran [Ondansetron]     "doesn't work"  . Latex Rash  . Sulfa Antibiotics Itching    SOCIAL HISTORY:  Social History   Tobacco Use   . Smoking status: Current Every Day Smoker    Packs/day: 0.25    Years: 0.50    Pack years: 0.12    Types: Cigarettes  . Smokeless tobacco: Never Used  Substance Use Topics  . Alcohol use: Yes    Comment: states occasional binge drinking    FAMILY HISTORY: Family History  Problem Relation Age of Onset  . Drug abuse Brother   . Alcohol abuse Father   . Drug abuse Mother     EXAM: BP 125/81 (BP Location: Right Arm)   Pulse (!) 120   Temp 98.6 F (37 C) (Oral)   Resp 16   Ht 5\' 4"  (1.626 m)   Wt 86.2 kg   SpO2 99%   BMI 32.61 kg/m  CONSTITUTIONAL: Alert and oriented and responds appropriately to questions.  Chronically ill-appearing, appears older than stated age HEAD: Normocephalic EYES: Conjunctivae clear, pupils appear equal, EOMI ENT: normal nose; moist mucous membranes NECK: Supple, no meningismus, no nuchal rigidity, no LAD  CARD: Regular and tachycardic; S1 and S2 appreciated; no murmurs, no clicks, no rubs, no gallops RESP: Normal chest excursion without splinting or tachypnea; breath sounds clear and equal bilaterally; no wheezes, no rhonchi, no rales, no hypoxia or respiratory distress, speaking full sentences ABD/GI: Normal bowel sounds; non-distended; soft, non-tender, no rebound, no guarding, no peritoneal signs, no hepatosplenomegaly BACK:  The back appears normal and is non-tender to palpation, there is no CVA tenderness EXT: Normal ROM in all joints; non-tender to palpation; no edema; normal capillary refill; no cyanosis, no calf tenderness or swelling    SKIN: Normal color for age and race; warm; no rash NEURO: Moves all extremities equally, tremulous, no asterixis PSYCH: The patient's mood and manner are appropriate. Grooming and personal hygiene are appropriate.  MEDICAL DECISION MAKING: Patient here for alcohol withdrawal and chest pain.  She is tachycardic and tremulous here.  Will give IV fluids, IV Ativan, IV thiamine.  Will check labs, urine, chest  x-ray.  EKG shows sinus tachycardia without ischemic change.  Suspect her tachycardia is related to withdrawal.  Patient has required admission to the hospital previously.  She is not currently hallucinating and no seizure-like activity.  We will monitor her very closely.  I have low suspicion for ACS, PE or dissection.  I suspect that her chest pain is related to her alcohol withdrawal and sinus tachycardia today.  CIWA is 24.  ED PROGRESS: 2:30 AM  Patient is resting comfortably and heart rate has improved to the low 100s but she states she is still feeling poorly.  Will give Toradol, Phenergan and second dose of IV Ativan.  Labs pending.  Chest x-ray clear.  3:15 AM  Pt still tremulous and tachycardic.  Labs show sodium level of 126, elevated AST greater than ALT consistent with alcohol  abuse.  Troponin negative.  Chest x-ray clear.  I recommend admission for alcohol withdrawal but patient states she would like to spend more time thinking about it.  She states she is hoping the second dose of Ativan will make her feel better and she would like to go home.  I do not think that she is ready to stop drinking.  3:45 AM  Pt now intermittently confused.  She states she is still seeing things.  Told the nurse that she saw a pink pillow in the room when there is no such object in her room currently.  She states that she thought she drove herself to the emergency department in her mother's car but she came in by ambulance.  Still tachycardic and tremulous but drowsy.  Protecting her airway.  I do not feel she needs to be started on IV Precedex but will continue CIWA protocol and admit to [facility].  She now agrees to this plan.  4:15 AM Discussed patient's case with hospitalist, Dr. Clyde Lundborg.  I have recommended admission and patient (and family if present) agree with this plan. Admitting physician will place admission orders.   I reviewed all nursing notes, vitals, pertinent previous records, EKGs, lab  and urine results, imaging (as available).     EKG Interpretation  Date/Time:  Friday November 11 2018 02:08:39 EST Ventricular Rate:  103 PR Interval:  134 QRS Duration: 78 QT Interval:  348 QTC Calculation: 455 R Axis:   -58 Text Interpretation:  Sinus tachycardia Indeterminate axis Anterior infarct , age undetermined Abnormal ECG No significant change since last tracing Confirmed by Rochele Raring [phone]) on 11/11/2018 2:16:32 AM        CRITICAL CARE Performed by: Baxter Hire Lillyanne Bradburn   Total critical care time: 55 minutes  Critical care time was exclusive of separately billable procedures and treating other patients.  Critical care was necessary to treat or prevent imminent or life-threatening deterioration.  Critical care was time spent personally by me on the following activities: development of treatment plan with patient and/or surrogate as well as nursing, discussions with consultants, evaluation of patient's response to treatment, examination of patient, obtaining history from patient or surrogate, ordering and performing treatments and interventions, ordering and review of laboratory studies, ordering and review of radiographic studies, pulse oximetry and re-evaluation of patient's condition.    Eliot Bencivenga, Layla Maw, DO 11/11/18 [phone]

## 2018-11-11 NOTE — H&P (Signed)
Julie Conley is an 43 y.o. female.   Chief Complaint: alcohol withdrawal and chest pain HPI: The patient is a 43 yr old woman with a history of alcohol abuse and previous withdrawal that has resulted in seizures and has required intubation.  The patient states that she drinks about 2 bottles of wine daily. She says that her last drink was about 1500 on Thursday. She states that she began having tremors, nausea, and seeing spots 2 hours after that last drink. She then began to have chest pain and shortness of breath.   Upon her arrival in the ED the patient has a temperature of 98.6. Her RR is 16. Her pulse rate is 120. Blood pressure is 125/81 and she is satting 99% on room air.   The patient has had a HEART score of 2. Her chest pain and shortness of breath was felt to be due to her tachycardia and perhaps some gastritis. She currently has no pain.  Her sodium was 126. Potassium was 3.8. CO2 was 20.creatinine was 0.60. Anion gap was 20. AST was elevated at 158 and ALT was elevated at 73. Her troponin was less than 0.03. Her WBC is 6.6. Hemoglogin was 11.4. Hematocrit wasa 11.4. Platelets were 137. Alcohol level was 41.  Chest x-ray showed no acute cardiopulmonary disease. EKG demonstrated sinus tachycardia, no ischemic changes, and no changes since previous EKG.  Past Medical History:  Diagnosis Date  . Alcohol abuse   . Anxiety   . Bimalleolar fracture of left ankle 04/2015  . Complication of anesthesia    hypotension with breast augmentation  . Depression   . GERD (gastroesophageal reflux disease)   . Headache    "once/week" (03/20/2016)  . Hypothyroidism 2010-2012   "stopped RX in 2012 when I didn't meet standards" (03/20/2016)  . Iron deficiency anemia   . Migraines    "once/month" (03/20/2016)  . Pneumonia "several times"  . Sciatic pain 04/12/2015   left    Past Surgical History:  Procedure Laterality Date  . ABDOMINOPLASTY  2015  . APPENDECTOMY  ~ 1986  . AUGMENTATION  MAMMAPLASTY  2015  . CESAREAN SECTION  1996; 2000  . CYSTOSCOPY WITH HYDRODISTENSION AND BIOPSY  2010  . DILATION AND CURETTAGE OF UTERUS  2010  . ENDOMETRIAL ABLATION  2010  . FRACTURE SURGERY    . LAPAROSCOPIC CHOLECYSTECTOMY  2006  . LAPAROSCOPIC TUBAL LIGATION  2001   w/banding  . LAPAROTOMY  2011  . NASAL SEPTOPLASTY W/ TURBINOPLASTY  1991; 2008  . ORIF ANKLE FRACTURE Left 04/15/2015   Procedure: OPEN REDUCTION INTERNAL FIXATION (ORIF) LEFT BIMALLEOLAR AND SYNDESMOSIS ANKLE FRACTURE;  Surgeon: Sheral Apley, MD;  Location: [facility];  Service: Orthopedics;  Laterality: Left;  . ROUX-EN-Y GASTRIC BYPASS  2009  . SHOULDER ARTHROSCOPY    . TONSILLECTOMY  ~ 1981  . VAGINAL HYSTERECTOMY  2013   complete    Family History  Problem Relation Age of Onset  . Drug abuse Brother   . Alcohol abuse Father   . Drug abuse Mother    Social History:  reports that she has been smoking cigarettes. She has a 0.13 pack-year smoking history. She has never used smokeless tobacco. She reports current alcohol use. She reports that she does not use drugs. Medications Prior to Admission  Medication Sig Dispense Refill  . benzonatate (TESSALON) 100 MG capsule Take 1 capsule (100 mg total) by mouth every 8 (eight) hours. 21 capsule 0  . chlordiazePOXIDE (LIBRIUM)  25 MG capsule  PO TID x 1D, then 25-50mg  PO BID X 1D, then 25-50mg  PO QD X 1D 10 capsule 0  . doxycycline (VIBRAMYCIN) 100 MG capsule Take 1 capsule (100 mg total) by mouth 2 (two) times daily. (Patient not taking: Reported on 11/11/2018) 14 capsule 0  . DULoxetine (CYMBALTA) 30 MG capsule Take 1 capsule (30 mg total) by mouth daily. 30 capsule 0  . erythromycin ophthalmic ointment Place a 1/2 inch ribbon of ointment into the right lower eyelid TID 1 g 0  . folic acid (FOLVITE) 1 MG tablet Take 1 tablet (1 mg total) by mouth daily. 30 tablet 0  . gabapentin (NEURONTIN) 300 MG capsule Take 1 capsule (300 mg total) by mouth 3  (three) times daily. 90 capsule 0  . guaiFENesin (ROBITUSSIN) 100 MG/5ML liquid Take 5-10 mLs (100-200 mg total) by mouth every 6 (six) hours as needed for cough. 60 mL 0  . Hydrocortisone (GERHARDT'S BUTT CREAM) CREA Apply 1 application topically 4 (four) times daily. 1 each 0  . levothyroxine (SYNTHROID, LEVOTHROID) 50 MCG tablet Take 1 tablet (50 mcg total) by mouth daily before breakfast. 30 tablet 0  . metoprolol tartrate (LOPRESSOR) 25 MG tablet Take 1 tablet (25 mg total) by mouth 2 (two) times daily. 30 tablet 0  . pantoprazole (PROTONIX) 40 MG tablet Take 1 tablet (40 mg total) by mouth daily. 30 tablet 0  . promethazine (PHENERGAN) 25 MG tablet Take 1 tablet (25 mg total) by mouth every 6 (six) hours as needed for nausea or vomiting. 15 tablet 0  . thiamine 100 MG tablet Take 1 tablet (100 mg total) by mouth daily. 30 tablet 0    Allergies:  Allergies  Allergen Reactions  . Adhesive [Tape] Other (See Comments)    TEARS SKIN  . Ambien [Zolpidem Tartrate] Other (See Comments)    Delirium, disorientation  . Zofran [Ondansetron]     "doesn't work"  . Latex Rash  . Sulfa Antibiotics Itching    Pertinent items are noted in HPI.   General appearance: cooperative, distracted, no distress and somnolent Head: Normocephalic, without obvious abnormality, atraumatic Eyes: PERRLA, EOMBI, Sclera are non-icteric and non-injected Throat: lips, mucosa, and tongue normal; teeth and gums normal Neck: no adenopathy, no carotid bruit, no JVD, supple, symmetrical, trachea midline and thyroid not enlarged, symmetric, no tenderness/mass/nodules Resp: No increased work of breathing. No wheezes, rales, or rhonchi. No tactile fremitus. Chest wall: no tenderness Cardio: regular rate and rhythm, S1, S2 normal, no murmur, click, rub or gallop GI: soft, non-tender; bowel sounds normal; no masses,  no organomegaly Extremities: extremities normal, atraumatic, no cyanosis or edema Pulses: 2+ and  symmetric Skin: Skin color, texture, turgor normal. No rashes or lesions Lymph nodes: Cervical, supraclavicular, and axillary nodes normal. Neurologic: Grossly normal  Results for orders placed or performed during the hospital encounter of 11/11/18 (from the past 48 hour(s))  CBC with Differential     Status: Abnormal   Collection Time: 11/11/18  1:30 AM  Result Value Ref Range   WBC 6.6 4.0 - 10.5 K/uL   RBC 4.20 3.87 - 5.11 MIL/uL   Hemoglobin 11.4 (L) 12.0 - 15.0 g/dL   HCT 16.1 (L) 09.6 - 04.5 %   MCV 82.6 80.0 - 100.0 fL   MCH 27.1 26.0 - 34.0 pg   MCHC 32.9 30.0 - 36.0 g/dL   RDW 40.9 (H) 81.1 - 91.4 %   Platelets 137 (L) 150 - 400 K/uL   nRBC  0.0 0.0 - 0.2 %   Neutrophils Relative % 68 %   Neutro Abs 4.6 1.7 - 7.7 K/uL   Lymphocytes Relative 25 %   Lymphs Abs 1.6 0.7 - 4.0 K/uL   Monocytes Relative 4 %   Monocytes Absolute 0.2 0.1 - 1.0 K/uL   Eosinophils Relative 1 %   Eosinophils Absolute 0.0 0.0 - 0.5 K/uL   Basophils Relative 1 %   Basophils Absolute 0.0 0.0 - 0.1 K/uL   WBC Morphology MORPHOLOGY UNREMARKABLE    RBC Morphology MORPHOLOGY UNREMARKABLE    Smear Review MORPHOLOGY UNREMARKABLE    Immature Granulocytes 1 %   Abs Immature Granulocytes 0.03 0.00 - 0.07 K/uL    Comment: Performed at [facility], [address] [place], [place] [address]  Comprehensive metabolic panel     Status: Abnormal   Collection Time: 11/11/18  1:30 AM  Result Value Ref Range   Sodium 126 (L) 135 - 145 mmol/L   Potassium 3.8 3.5 - 5.1 mmol/L   Chloride 86 (L) 98 - 111 mmol/L   CO2 20 (L) 22 - 32 mmol/L   Glucose, Bld 90 70 - 99 mg/dL   BUN 6 6 - 20 mg/dL   Creatinine, Ser 5.20 0.44 - 1.00 mg/dL   Calcium 8.7 (L) 8.9 - 10.3 mg/dL   Total Protein 7.8 6.5 - 8.1 g/dL   Albumin 4.4 3.5 - 5.0 g/dL   AST 802 (H) 15 - 41 U/L   ALT 73 (H) 0 - 44 U/L   Alkaline Phosphatase 110 38 - 126 U/L   Total Bilirubin 0.6 0.3 - 1.2 mg/dL   GFR calc non Af Amer >60 >60 mL/min   GFR  calc Af Amer >60 >60 mL/min   Anion gap 20 (H) 5 - 15    Comment: Performed at [facility], [address] [place], [place] [address]  Ethanol     Status: Abnormal   Collection Time: 11/11/18  1:30 AM  Result Value Ref Range   Alcohol, Ethyl (B) 41 (H) <10 mg/dL    Comment: (NOTE) Lowest detectable limit for serum alcohol is 10 mg/dL. For medical purposes only. Performed at [facility], [address] [place], [place] [address]   Troponin I - ONCE - STAT     Status: None   Collection Time: 11/11/18  1:30 AM  Result Value Ref Range   Troponin I <0.03 <0.03 ng/mL    Comment: Performed at [facility], [address] [place], [place] [address]  Rapid urine drug screen (hospital performed)     Status: None   Collection Time: 11/11/18  2:52 AM  Result Value Ref Range   Opiates NONE DETECTED NONE DETECTED   Cocaine NONE DETECTED NONE DETECTED   Benzodiazepines NONE DETECTED NONE DETECTED   Amphetamines NONE DETECTED NONE DETECTED   Tetrahydrocannabinol NONE DETECTED NONE DETECTED   Barbiturates NONE DETECTED NONE DETECTED    Comment: (NOTE) DRUG SCREEN FOR MEDICAL PURPOSES ONLY.  IF CONFIRMATION IS NEEDED FOR ANY PURPOSE, NOTIFY LAB WITHIN 5 DAYS. LOWEST DETECTABLE LIMITS FOR URINE DRUG SCREEN Drug Class                     Cutoff (ng/mL) Amphetamine and metabolites    1000 Barbiturate and metabolites    200 Benzodiazepine                 200 Tricyclics and  metabolites     300 Opiates and metabolites        300 Cocaine and metabolites        300 THC                            50 Performed at [facility], [address] [place], [place] [address]    @  Blood pressure (!) 151/86, pulse 98, temperature 99 F (37.2 C), temperature source Oral, resp. rate 15, height  (1.626 m), weight 94.5 kg, SpO2 97 %.    Assessment/Plan Problem  Withdrawal Symptoms, Alcohol, With Delirium (Hcc)  Alcoholic Hepatitis  Without Ascites  Alcoholic Gastritis  Gastritis  Alcohol Dependence With Withdrawal With Complication (Hcc)  Hyponatremia  The patient has been admitted to an ICU bed due to her history of seizures and need for mechanical ventilation with previous episodes of alcohol withdrawal. She will receive protonix and supplementation of thiamine and folate. She will be on an alcohol withdrawal protocol. She will be continued on her home medication as possible. Her LFT's will be carefully monitored. She will be on a fluid restriction to address her hyponatremia. Her electrolytes and volume status will be carefully monitored. Before discharge the patient will be given resources for alcohol rehab.  I have seen and examined this patient myself. I have spent 70 minutes on her evaluation and admission.  Sherial Ebrahim 11/11/2018, 8:21 AM

## 2018-11-11 NOTE — ED Notes (Signed)
ED TO INPATIENT HANDOFF REPORT  ED Nurse Name and Phone #: Sharyl Nimrod [phone]  S Name/Age/Gender Julie Conley 43 y.o. female Room/Bed: MH05/MH05  Code Status   Code Status: Prior  Home/SNF/Other Home Patient oriented to: self, place, time and situation Is this baseline? Yes   Triage Complete: Triage complete  Chief Complaint alcohol withdrawl  Triage Note Pt with c/o etoh withdrawal. Pt reports last drink was yesterday at 1500.    Allergies Allergies  Allergen Reactions  . Adhesive [Tape] Other (See Comments)    TEARS SKIN  . Ambien [Zolpidem Tartrate] Other (See Comments)    Delirium, disorientation  . Zofran [Ondansetron]     "doesn't work"  . Latex Rash  . Sulfa Antibiotics Itching    Level of Care/Admitting Diagnosis ED Disposition    ED Disposition Condition Comment   Admit  Hospital Area: [facility] [[id number]]  Level of Care: Stepdown [14]  Admit to SDU based on following criteria: Other see comments  Comments: severe alcohol withdraw  Diagnosis: Alcohol withdrawal (HCC) [291.81.ICD-9-CM]  Admitting Physician: Lorretta Harp [[id number]]  Attending Physician: Lorretta Harp [[id number]]  PT Class (Do Not Modify): Observation [104]  PT Acc Code (Do Not Modify): Observation [10022]       B Medical/Surgery History Past Medical History:  Diagnosis Date  . Alcohol abuse   . Anxiety   . Bimalleolar fracture of left ankle 04/2015  . Complication of anesthesia    hypotension with breast augmentation  . Depression   . GERD (gastroesophageal reflux disease)   . Headache    "once/week" (03/20/2016)  . Hypothyroidism 2010-2012   "stopped RX in 2012 when I didn't meet standards" (03/20/2016)  . Iron deficiency anemia   . Migraines    "once/month" (03/20/2016)  . Pneumonia "several times"  . Sciatic pain 04/12/2015   left   Past Surgical History:  Procedure Laterality Date  . ABDOMINOPLASTY  2015  . APPENDECTOMY  ~ 1986  . AUGMENTATION MAMMAPLASTY   2015  . CESAREAN SECTION  1996; 2000  . CYSTOSCOPY WITH HYDRODISTENSION AND BIOPSY  2010  . DILATION AND CURETTAGE OF UTERUS  2010  . ENDOMETRIAL ABLATION  2010  . FRACTURE SURGERY    . LAPAROSCOPIC CHOLECYSTECTOMY  2006  . LAPAROSCOPIC TUBAL LIGATION  2001   w/banding  . LAPAROTOMY  2011  . NASAL SEPTOPLASTY W/ TURBINOPLASTY  1991; 2008  . ORIF ANKLE FRACTURE Left 04/15/2015   Procedure: OPEN REDUCTION INTERNAL FIXATION (ORIF) LEFT BIMALLEOLAR AND SYNDESMOSIS ANKLE FRACTURE;  Surgeon: Sheral Apley, MD;  Location: [facility];  Service: Orthopedics;  Laterality: Left;  . ROUX-EN-Y GASTRIC BYPASS  2009  . SHOULDER ARTHROSCOPY    . TONSILLECTOMY  ~ 1981  . VAGINAL HYSTERECTOMY  2013   complete     A IV Location/Drains/Wounds Patient Lines/Drains/Airways Status   Active Line/Drains/Airways    Name:   Placement date:   Placement time:   Site:   Days:   Peripheral IV 11/11/18 Right Antecubital   11/11/18    0133    Antecubital   less than 1          Intake/Output Last 24 hours No intake or output data in the 24 hours ending 11/11/18 0518  Labs/Imaging Results for orders placed or performed during the hospital encounter of 11/11/18 (from the past 48 hour(s))  CBC with Differential     Status: Abnormal   Collection Time: 11/11/18  1:30 AM  Result Value Ref Range  WBC 6.6 4.0 - 10.5 K/uL   RBC 4.20 3.87 - 5.11 MIL/uL   Hemoglobin 11.4 (L) 12.0 - 15.0 g/dL   HCT 38.1 (L) 82.9 - 93.7 %   MCV 82.6 80.0 - 100.0 fL   MCH 27.1 26.0 - 34.0 pg   MCHC 32.9 30.0 - 36.0 g/dL   RDW 16.9 (H) 67.8 - 93.8 %   Platelets 137 (L) 150 - 400 K/uL   nRBC 0.0 0.0 - 0.2 %   Neutrophils Relative % 68 %   Neutro Abs 4.6 1.7 - 7.7 K/uL   Lymphocytes Relative 25 %   Lymphs Abs 1.6 0.7 - 4.0 K/uL   Monocytes Relative 4 %   Monocytes Absolute 0.2 0.1 - 1.0 K/uL   Eosinophils Relative 1 %   Eosinophils Absolute 0.0 0.0 - 0.5 K/uL   Basophils Relative 1 %   Basophils Absolute 0.0  0.0 - 0.1 K/uL   WBC Morphology MORPHOLOGY UNREMARKABLE    RBC Morphology MORPHOLOGY UNREMARKABLE    Smear Review MORPHOLOGY UNREMARKABLE    Immature Granulocytes 1 %   Abs Immature Granulocytes 0.03 0.00 - 0.07 K/uL    Comment: Performed at [facility], [address] [place], [place] [address]  Comprehensive metabolic panel     Status: Abnormal   Collection Time: 11/11/18  1:30 AM  Result Value Ref Range   Sodium 126 (L) 135 - 145 mmol/L   Potassium 3.8 3.5 - 5.1 mmol/L   Chloride 86 (L) 98 - 111 mmol/L   CO2 20 (L) 22 - 32 mmol/L   Glucose, Bld 90 70 - 99 mg/dL   BUN 6 6 - 20 mg/dL   Creatinine, Ser 1.02 0.44 - 1.00 mg/dL   Calcium 8.7 (L) 8.9 - 10.3 mg/dL   Total Protein 7.8 6.5 - 8.1 g/dL   Albumin 4.4 3.5 - 5.0 g/dL   AST 585 (H) 15 - 41 U/L   ALT 73 (H) 0 - 44 U/L   Alkaline Phosphatase 110 38 - 126 U/L   Total Bilirubin 0.6 0.3 - 1.2 mg/dL   GFR calc non Af Amer >60 >60 mL/min   GFR calc Af Amer >60 >60 mL/min   Anion gap 20 (H) 5 - 15    Comment: Performed at [facility], [address] [place], [place] [address]  Ethanol     Status: Abnormal   Collection Time: 11/11/18  1:30 AM  Result Value Ref Range   Alcohol, Ethyl (B) 41 (H) <10 mg/dL    Comment: (NOTE) Lowest detectable limit for serum alcohol is 10 mg/dL. For medical purposes only. Performed at [facility], [address] [place], [place] [address]   Troponin I - ONCE - STAT     Status: None   Collection Time: 11/11/18  1:30 AM  Result Value Ref Range   Troponin I <0.03 <0.03 ng/mL    Comment: Performed at [facility], [address] [place], [place] [address]  Rapid urine drug screen (hospital performed)     Status: None   Collection Time: 11/11/18  2:52 AM  Result Value Ref Range   Opiates NONE DETECTED NONE DETECTED   Cocaine NONE DETECTED NONE DETECTED   Benzodiazepines NONE DETECTED NONE DETECTED   Amphetamines NONE DETECTED NONE DETECTED    Tetrahydrocannabinol NONE DETECTED NONE DETECTED   Barbiturates NONE DETECTED NONE DETECTED    Comment: (NOTE) DRUG SCREEN FOR MEDICAL PURPOSES ONLY.  IF CONFIRMATION IS NEEDED FOR ANY PURPOSE, NOTIFY LAB WITHIN 5 DAYS. LOWEST DETECTABLE LIMITS FOR URINE DRUG SCREEN Drug Class                     Cutoff (ng/mL) Amphetamine and metabolites    1000 Barbiturate and metabolites    200 Benzodiazepine                 200 Tricyclics and metabolites     300 Opiates and metabolites        300 Cocaine and metabolites        300 THC                            50 Performed at [facility], [address], [place], [place] [address]    Dg Chest 2 View  Result Date: 11/11/2018 CLINICAL DATA:  Alcohol withdrawal times 10 hours. Mid chest pain and dyspnea. EXAM: CHEST - 2 VIEW COMPARISON:  09/18/2018 FINDINGS: Normal heart size and mediastinal contours. Lungs are clear. Plate and screw fixation across the included proximal right humerus. No acute osseous appearing abnormality. IMPRESSION: No active cardiopulmonary disease. Electronically Signed   By: Tollie Eth M.D.   On: 11/11/2018 02:10    Pending Labs Unresulted Labs (From admission, onward)   None      Vitals/Pain Today's Vitals   11/11/18 0439 11/11/18 0441 11/11/18 0500 11/11/18 0503  BP: (!) 142/81  (!) 153/70   Pulse: (!) 114  (!) 102   Resp:   15   Temp:      TempSrc:      SpO2:   97%   Weight:      Height:      PainSc:  Asleep  Asleep    Isolation Precautions No active isolations  Medications Medications  0.9 %  sodium chloride infusion ( Intravenous Paused 11/11/18 0501)  LORazepam (ATIVAN) injection 0-4 mg (has no administration in time range)    Or  LORazepam (ATIVAN) tablet 0-4 mg (has no administration in time range)  LORazepam (ATIVAN) injection 0-4 mg (has no administration in time range)    Or  LORazepam (ATIVAN) tablet 0-4 mg (has no administration in time range)  thiamine (VITAMIN B-1) tablet 100  mg (has no administration in time range)    Or  thiamine (B-1) injection 100 mg (has no administration in time range)  chlordiazePOXIDE (LIBRIUM) capsule 10 mg (has no administration in time range)  sodium chloride 0.9 % bolus 1,000 mL (0 mLs Intravenous Stopped 11/11/18 0245)  LORazepam (ATIVAN) injection 1 mg (1 mg Intravenous Given 11/11/18 0145)  thiamine (B-1) injection 100 mg (100 mg Intravenous Given 11/11/18 0148)  LORazepam (ATIVAN) injection 1 mg (1 mg Intravenous Given 11/11/18 0246)  promethazine (PHENERGAN) injection 25 mg (25 mg Intravenous Given 11/11/18 0243)  ketorolac (TORADOL) 30 MG/ML injection 30 mg (30 mg Intravenous Given 11/11/18 0239)  sodium chloride 0.9 % bolus 1,000 mL (1,000 mLs Intravenous New Bag/Given 11/11/18 0501)    Mobility walks with person assist Low fall risk   Focused Assessments Cardiac Assessment Handoff:    Lab Results  Component Value Date   CKTOTAL 300 (H) 07/20/2017   CKMB 8.0 (H) 07/20/2017   TROPONINI <0.03 11/11/2018   No results found for: DDIMER Does the Patient currently have chest pain? No     R Recommendations: See Admitting Provider Note  Report given to:  Additional Notes:

## 2018-11-11 NOTE — ED Notes (Signed)
[facility] notified (Tammy) - patient ready for transport 

## 2018-11-11 NOTE — Care Management (Addendum)
This is a no charge note  Transfer from [facility] per Dr. Elesa Massed  43 year old lady with past medical history of alcohol abuse, alcohol withdrawal seizure required ICU placement and intubation in the past, tobacco abuse, iron deficiency anemia, migraine headache, GERD, hypothyroidism, depression, anxiety, who presents with alcohol withdrawal and chest pain. Pt has intermittent confusion, but no seizure.  Patient states that she has been drinking 2 bottles of wine a day for the past 2 weeks. Last drink was 3 PM yesterday March 5. Pt started having withdrawal symptoms, including tremulous, visual hallucination of seeing spots and nausea.  Also has atypical chest pain or shortness of breath. Denies drug use. Denies SI or HI.  Patient was found to have alcohol level 41, WBC 6.6, sodium 126, abnormal liver function (ALP 110, AST 158, AST 73, total bilirubin 0.6), renal function normal, negative troponin, negative UDS, temperature normal, tachycardia with heart rate up to 120s, oxygen saturation 95% on room air, chest x-ray negative.  Patient was started with CIWA protocol.  Admitted to stepdown for observation.   Please call manager of [facility] at [phone] when pt arrives to floor   Lorretta Harp, MD  [facility]   If 7PM-7AM, please contact night-coverage [email or web address] Password [email or web address] 11/11/2018, 4:19 AM

## 2018-11-11 NOTE — ED Triage Notes (Signed)
Pt with c/o etoh withdrawal. Pt reports last drink was yesterday at 1500.

## 2018-11-12 LAB — COMPREHENSIVE METABOLIC PANEL
ALT: 44 U/L (ref 0–44)
AST: 63 U/L — ABNORMAL HIGH (ref 15–41)
Albumin: 3.9 g/dL (ref 3.5–5.0)
Alkaline Phosphatase: 86 U/L (ref 38–126)
Anion gap: 8 (ref 5–15)
BUN: <5 mg/dL — ABNORMAL LOW (ref 6–20)
CALCIUM: 8.4 mg/dL — AB (ref 8.9–10.3)
CO2: 26 mmol/L (ref 22–32)
CREATININE: 0.6 mg/dL (ref 0.44–1.00)
Chloride: 102 mmol/L (ref 98–111)
GFR calc Af Amer: >60 mL/min (ref >60)
Glucose, Bld: 92 mg/dL (ref 70–99)
Potassium: 3.3 mmol/L — ABNORMAL LOW (ref 3.5–5.1)
Sodium: 136 mmol/L (ref 135–145)
Total Bilirubin: 0.8 mg/dL (ref 0.3–1.2)
Total Protein: 6.5 g/dL (ref 6.5–8.1)

## 2018-11-12 LAB — TSH: TSH: 5.715 u[IU]/mL — ABNORMAL HIGH (ref 0.350–4.500)

## 2018-11-12 LAB — CBC
HCT: 31.9 % — ABNORMAL LOW (ref 36.0–46.0)
Hemoglobin: 9.8 g/dL — ABNORMAL LOW (ref 12.0–15.0)
MCH: 27.1 pg (ref 26.0–34.0)
MCHC: 30.7 g/dL (ref 30.0–36.0)
MCV: 88.1 fL (ref 80.0–100.0)
Platelets: 68 10*3/uL — ABNORMAL LOW (ref 150–400)
RBC: 3.62 MIL/uL — ABNORMAL LOW (ref 3.87–5.11)
RDW: 21.6 % — AB (ref 11.5–15.5)
WBC: 3.1 10*3/uL — ABNORMAL LOW (ref 4.0–10.5)
nRBC: 0 % (ref 0.0–0.2)

## 2018-11-12 LAB — PROTIME-INR
INR: 1 (ref 0.8–1.2)
Prothrombin Time: 13.4 seconds (ref 11.4–15.2)

## 2018-11-12 MED ORDER — POTASSIUM CHLORIDE CRYS ER 20 MEQ PO TBCR
40.0000 meq | EXTENDED_RELEASE_TABLET | Freq: Once | ORAL | Status: AC
  Administered 2018-11-12: 40 meq via ORAL
  Filled 2018-11-12: qty 2

## 2018-11-12 MED ORDER — DULOXETINE HCL 30 MG PO CPEP
30.0000 mg | ORAL_CAPSULE | Freq: Every day | ORAL | Status: DC
  Administered 2018-11-12 – 2018-11-15 (×4): 30 mg via ORAL
  Filled 2018-11-12 (×4): qty 1

## 2018-11-12 MED ORDER — MAGNESIUM SULFATE 2 GM/50ML IV SOLN
2.0000 g | Freq: Once | INTRAVENOUS | Status: AC
  Administered 2018-11-12: 2 g via INTRAVENOUS
  Filled 2018-11-12: qty 50

## 2018-11-12 MED ORDER — LEVOTHYROXINE SODIUM 50 MCG PO TABS
50.0000 ug | ORAL_TABLET | Freq: Every day | ORAL | Status: DC
  Administered 2018-11-13 – 2018-11-15 (×3): 50 ug via ORAL
  Filled 2018-11-12 (×3): qty 1

## 2018-11-12 NOTE — Progress Notes (Signed)
PROGRESS NOTE  Julie Conley SWF:[id number] DOB: 04/19/1976 DOA: 11/11/2018 PCP: Patient, No Pcp Per  HPI/Recap of past 24 hours: The patient is a 43 yr old woman with a history of alcohol abuse and previous withdrawal that has resulted in seizures and has required intubation.  The patient states that she drinks about 2 bottles of wine daily. She says that her last drink was about 1500 on Thursday. She states that she began having tremors, nausea, and seeing spots 2 hours after that last drink. She then began to have chest pain and shortness of breath.   Upon her arrival in the ED the patient has a temperature of 98.6. Her RR is 16. Her pulse rate is 120. Blood pressure is 125/81 and she is satting 99% on room air.   The patient has had a HEART score of 2. Her chest pain and shortness of breath was felt to be due to her tachycardia and perhaps some gastritis. She currently has no pain.  Her sodium was 126. Potassium was 3.8. CO2 was 20.creatinine was 0.60. Anion gap was 20. AST was elevated at 158 and ALT was elevated at 73. Her troponin was less than 0.03. Her WBC is 6.6. Hemoglogin was 11.4. Hematocrit wasa 11.4. Platelets were 137. Alcohol level was 41.  Chest x-ray showed no acute cardiopulmonary disease. EKG demonstrated sinus tachycardia, no ischemic changes, and no changes since previous EKG.  11/12/18: Seen and examined at bedside.  Reports auditory and visual hallucinations.  Appears restless.  No other complaints.  On alcohol withdrawal protocol.  Assessment/Plan: Principal Problem:   Alcohol withdrawal (HCC) Active Problems:   Hypothyroidism   Alcohol dependence with withdrawal with complication (HCC)   Hyponatremia   Chest pain   Depression   Alcoholic gastritis   Alcohol abuse   Abnormal LFTs   Withdrawal symptoms, alcohol, with delirium (HCC)   Alcoholic hepatitis without ascites  Alcohol intoxication with concern for alcohol withdrawal At baseline drinks 2  bottles of wine per day Presented with alcohol level of 41 Reports visual and auditory hallucinations States she hears her mother have a conversation with her and she sees things crawling in the walls Appears restless CIWA protocol in place Continue folate and thiamine vitamins Continue IV fluid hydration  Hypokalemia Potassium 3.3 Repleted with KCl p.o. 40 mEq once Also added 2 g of IV magnesium  Elevated AST, suspect secondary to alcohol abuse AST trending down from 158 to 63  Pancytopenia suspect multifactorial secondary to chronic alcohol abuse superimposed by hemodilution Normal WBC on 11/11/2018 with platelet 137 WBC on three 723.1 and platelets 60 8K Hemoglobin 9.8 from 11.4  Chronic normocytic anemia No sign of overt bleeding Hemoglobin stable at 9.8 Drop in hemoglobin suspect hemodilution from 11.4 on admission to 9.8 on 11/12/2018  Hallucinations, visual and auditory If persistent despite CIWA protocol will consider psych consult  Hypothyroidism Obtain TSH Resume levothyroxine  GERD Continue Protonix  Chronic depression/anxiety Continue duloxetine     Code Status: Full code  Family Communication: None at bedside  Disposition Plan: Home when clinically stable   Consultants:  None  Procedures:  None  Antimicrobials:  None  DVT prophylaxis: Subcu Lovenox daily   Objective: Vitals:   11/12/18 0422 11/12/18 0500 11/12/18 0700 11/12/18 0750  BP: (!) 153/89 (!) 160/140 (!) 175/87   Pulse:  (!) 103 98   Resp:  17 17   Temp:    98 F (36.7 C)  TempSrc:    Oral  SpO2:  98% 92%   Weight:      Height:        Intake/Output Summary (Last 24 hours) at 11/12/2018 0931 Last data filed at 11/12/2018 0700 Gross per 24 hour  Intake 3203.66 ml  Output -  Net 3203.66 ml   Filed Weights   11/11/18 0056 11/11/18 0645 11/11/18 0646  Weight: 86.2 kg 94.5 kg 94.5 kg    Exam:  . General: 43 y.o. year-old female well developed well nourished in no  acute distress.  Alert and oriented x3.  Appears restless. . Cardiovascular: Regular rate and rhythm with no rubs or gallops.  No thyromegaly or JVD noted.   Marland Kitchen Respiratory: Clear to auscultation with no wheezes or rales. Good inspiratory effort. . Abdomen: Soft nontender nondistended with normal bowel sounds x4 quadrants. . Musculoskeletal: No lower extremity edema. 2/4 pulses in all 4 extremities. Marland Kitchen Psychiatry: Mood is anxious.     Data Reviewed: CBC: Recent Labs  Lab 11/11/18 0130 11/11/18 0807 11/12/18 0317  WBC 6.6 3.3* 3.1*  NEUTROABS 4.6  --   --   HGB 11.4* 10.1* 9.8*  HCT 34.7* 31.9* 31.9*  MCV 82.6 86.0 88.1  PLT 137* 87* 68*   Basic Metabolic Panel: Recent Labs  Lab 11/11/18 0130 11/11/18 0807 11/12/18 0317  NA 126*  --  136  K 3.8  --  3.3*  CL 86*  --  102  CO2 20*  --  26  GLUCOSE 90  --  92  BUN 6  --  <5*  CREATININE 0.60 0.66 0.60  CALCIUM 8.7*  --  8.4*   GFR: Estimated Creatinine Clearance: 101.1 mL/min (by C-G formula based on SCr of 0.6 mg/dL). Liver Function Tests: Recent Labs  Lab 11/11/18 0130 11/12/18 0317  AST 158* 63*  ALT 73* 44  ALKPHOS 110 86  BILITOT 0.6 0.8  PROT 7.8 6.5  ALBUMIN 4.4 3.9   No results for input(s): LIPASE, AMYLASE in the last 168 hours. No results for input(s): AMMONIA in the last 168 hours. Coagulation Profile: Recent Labs  Lab 11/12/18 0317  INR 1.0   Cardiac Enzymes: Recent Labs  Lab 11/11/18 0130  TROPONINI <0.03   BNP (last 3 results) No results for input(s): PROBNP in the last 8760 hours. HbA1C: No results for input(s): HGBA1C in the last 72 hours. CBG: No results for input(s): GLUCAP in the last 168 hours. Lipid Profile: No results for input(s): CHOL, HDL, LDLCALC, TRIG, CHOLHDL, LDLDIRECT in the last 72 hours. Thyroid Function Tests: Recent Labs    11/12/18 0547  TSH 5.715*   Anemia Panel: No results for input(s): VITAMINB12, FOLATE, FERRITIN, TIBC, IRON, RETICCTPCT in the last 72  hours. Urine analysis:    Component Value Date/Time   COLORURINE YELLOW 09/18/2018 2110   APPEARANCEUR CLOUDY (A) 09/18/2018 2110   LABSPEC <1.005 (L) 09/18/2018 2110   PHURINE 6.5 09/18/2018 2110   GLUCOSEU NEGATIVE 09/18/2018 2110   HGBUR MODERATE (A) 09/18/2018 2110   BILIRUBINUR NEGATIVE 09/18/2018 2110   KETONESUR NEGATIVE 09/18/2018 2110   PROTEINUR NEGATIVE 09/18/2018 2110   UROBILINOGEN 1.0 02/26/2013 1916   NITRITE NEGATIVE 09/18/2018 2110   LEUKOCYTESUR LARGE (A) 09/18/2018 2110   Sepsis Labs: (procalcitonin:4,lacticidven:4)  ) Recent Results (from the past 240 hour(s))  MRSA PCR Screening     Status: None   Collection Time: 11/11/18  7:51 AM  Result Value Ref Range Status   MRSA by PCR NEGATIVE NEGATIVE Final    Comment:  The GeneXpert MRSA Assay (FDA approved for NASAL specimens only), is one component of a comprehensive MRSA colonization surveillance program. It is not intended to diagnose MRSA infection nor to guide or monitor treatment for MRSA infections. Performed at [facility], [address], [place], [place] [address]       Studies: No results found.  Scheduled Meds: . chlordiazePOXIDE  10 mg Oral TID  . enoxaparin (LOVENOX) injection  40 mg Subcutaneous Q24H  . folic acid  1 mg Oral Daily  . LORazepam  0-4 mg Intravenous Q6H   Or  . LORazepam  0-4 mg Oral Q6H  . [START ON 11/13/2018] LORazepam  0-4 mg Intravenous Q12H   Or  . [START ON 11/13/2018] LORazepam  0-4 mg Oral Q12H  . LORazepam  1 mg Intravenous Once  . multivitamin with minerals  1 tablet Oral Daily  . pantoprazole  40 mg Oral Daily  . thiamine  100 mg Oral Daily   Or  . thiamine  100 mg Intravenous Daily    Continuous Infusions: . sodium chloride 125 mL/hr at 11/12/18 0751     LOS: 1 day     Darlin Drop, MD [facility] Pager [phone]  If 7PM-7AM, please contact night-coverage [email or web address] Password [facility] 11/12/2018,  9:31 AM

## 2018-11-13 LAB — URINALYSIS, ROUTINE W REFLEX MICROSCOPIC
Bilirubin Urine: NEGATIVE
Glucose, UA: NEGATIVE mg/dL
Hgb urine dipstick: NEGATIVE
KETONES UR: NEGATIVE mg/dL
Leukocytes,Ua: NEGATIVE
Nitrite: NEGATIVE
Protein, ur: NEGATIVE mg/dL
Specific Gravity, Urine: 1.009 (ref 1.005–1.030)
pH: 6 (ref 5.0–8.0)

## 2018-11-13 MED ORDER — CLONIDINE HCL 0.1 MG PO TABS
0.1000 mg | ORAL_TABLET | Freq: Two times a day (BID) | ORAL | Status: DC
  Administered 2018-11-13 – 2018-11-15 (×5): 0.1 mg via ORAL
  Filled 2018-11-13 (×5): qty 1

## 2018-11-13 MED ORDER — ENALAPRILAT 1.25 MG/ML IV SOLN
0.6250 mg | Freq: Once | INTRAVENOUS | Status: AC
  Administered 2018-11-13: 0.625 mg via INTRAVENOUS
  Filled 2018-11-13: qty 1

## 2018-11-13 MED ORDER — AMLODIPINE BESYLATE 5 MG PO TABS: 5.0000 mg | ORAL_TABLET | Freq: Every day | ORAL | Status: DC

## 2018-11-13 MED ORDER — AMLODIPINE BESYLATE 5 MG PO TABS
5.0000 mg | ORAL_TABLET | Freq: Every day | ORAL | Status: DC
  Administered 2018-11-13: 5 mg via ORAL
  Filled 2018-11-13: qty 1

## 2018-11-13 MED ORDER — CHLORHEXIDINE GLUCONATE CLOTH 2 % EX PADS: 6.0000 | MEDICATED_PAD | Freq: Every day | CUTANEOUS | Status: DC

## 2018-11-13 NOTE — Progress Notes (Addendum)
PROGRESS NOTE  Julie Conley KMM:[id number] DOB: [birth year] DOA: 11/11/2018 PCP: Patient, No Pcp Per  HPI/Recap of past 24 hours: The patient is a 43 yr old woman with a history of alcohol abuse and previous withdrawal that has resulted in seizures and has required intubation.  The patient states that she drinks about 2 bottles of wine daily. She says that her last drink was about 1500 on Thursday. She states that she began having tremors, nausea, and seeing spots 2 hours after that last drink. She then began to have chest pain and shortness of breath.   Upon her arrival in the ED the patient has a temperature of 98.6. Her RR is 16. Her pulse rate is 120. Blood pressure is 125/81 and she is satting 99% on room air.   The patient has had a HEART score of 2. Her chest pain and shortness of breath was felt to be due to her tachycardia and perhaps some gastritis. She currently has no pain.  Her sodium was 126. Potassium was 3.8. CO2 was 20.creatinine was 0.60. Anion gap was 20. AST was elevated at 158 and ALT was elevated at 73. Her troponin was less than 0.03. Her WBC is 6.6. Hemoglogin was 11.4. Hematocrit wasa 11.4. Platelets were 137. Alcohol level was 41.  Chest x-ray showed no acute cardiopulmonary disease. EKG demonstrated sinus tachycardia, no ischemic changes, and no changes since previous EKG.  11/12/18: Reports auditory and visual hallucinations.  Appears restless.  No other complaints.  On alcohol withdrawal protocol.  11/13/18: Patient seen and examined at her bedside.  Reports persistent visual hallucinations.  Also reports intermittent discomfort when she urinates similar to prior UTI.  States she was recently treated for UTI less than 2 months ago.  Will obtain UA and urine culture.  Continue CIWA protocol due to concern for alcohol withdrawal.  Assessment/Plan: Principal Problem:   Alcohol withdrawal (HCC) Active Problems:   Hypothyroidism   Alcohol dependence with withdrawal  with complication (HCC)   Hyponatremia   Chest pain   Depression   Alcoholic gastritis   Alcohol abuse   Abnormal LFTs   Withdrawal symptoms, alcohol, with delirium (HCC)   Alcoholic hepatitis without ascites  Alcohol intoxication with concern for alcohol withdrawal At baseline drinks 2 bottles of wine per day Presented with alcohol level of 41 Reports persistent visual hallucinations Continue CIWA protocol in place Continue folate and thiamine vitamins Continue gentle IV fluid hydration normal saline at 75 cc/h  Lower urinary tract symptoms Reports intermittent discomfort when she urinates, states similar to prior UTI Recently treated for UTI 2 months ago Obtain urine analysis and urine culture  Uncontrolled hypertension, possibly related to alcohol withdrawal versus underlying hypertension Not on antihypertensive medications at home Start clonidine 0.1 mg BID for suspected alcohol withdrawal induced HTN Increase clonidine dose as indicated if uncontrolled HTN is persistent DC amlodipine 5 mg daily Reduce IV fluid normal saline from 125 cc/h to 75 cc/h Continue to monitor vital signs  Hypokalemia, repleted Repleted with KCl p.o. 40 mEq once Also added 2 g of IV magnesium Repeat BMP in the morning  Elevated AST, suspect secondary to alcohol abuse AST trending down from 158 to 63 Obtain CMP in the morning  Pancytopenia suspect multifactorial secondary to chronic alcohol abuse superimposed by hemodilution Repeat CBC in the morning  Chronic normocytic anemia No sign of overt bleeding Hemoglobin stable at 9.8 Drop in hemoglobin suspect hemodilution from 11.4 on admission to 9.8 on 11/12/2018 CBC in  the a.m.  Hallucinations, visual and auditory Suspect secondary to alcohol withdrawal Continue CIWA protocol  Hypothyroidism TSH 5.7 Continue levothyroxine  GERD Continue Protonix  Chronic depression/anxiety Continue duloxetine     Code Status: Full  code  Family Communication: None at bedside  Disposition Plan: Home when clinically stable   Consultants:  None  Procedures:  None  Antimicrobials:  None  DVT prophylaxis: Subcu Lovenox daily   Objective: Vitals:   11/13/18 0400 11/13/18 0500 11/13/18 0600 11/13/18 0700  BP:  (!) 176/86 (!) 178/79 (!) 183/81  Pulse: 75 70 77 65  Resp: 14 14 13 13   Temp: 98.6 F (37 C)     TempSrc: Oral     SpO2: 96% 98% 96% 97%  Weight:      Height:        Intake/Output Summary (Last 24 hours) at 11/13/2018 [id number] Last data filed at 11/13/2018 0800 Gross per 24 hour  Intake 3931.43 ml  Output 1000 ml  Net 2931.43 ml   Filed Weights   11/11/18 0056 11/11/18 0645 11/11/18 0646  Weight: 86.2 kg 94.5 kg 94.5 kg    Exam:  . General: 43 y.o. year-old female well-developed well-nourished in no acute distress.  Alert and oriented x3. . Cardiovascular: Regular rate and rhythm with no rubs or gallops.  No JVD or thyromegaly.   Marland Kitchen. Respiratory: Clear to station with no wheezes or rales.  Good inspiratory effort. . Abdomen: Soft nontender nondistended with normal bowel sounds x4 quadrants. . Musculoskeletal: No lower extremity edema. 2/4 pulses in all 4 extremities. Marland Kitchen. Psychiatry: Mood is appropriate for condition and setting.   Data Reviewed: CBC: Recent Labs  Lab 11/11/18 0130 11/11/18 0807 11/12/18 0317  WBC 6.6 3.3* 3.1*  NEUTROABS 4.6  --   --   HGB 11.4* 10.1* 9.8*  HCT 34.7* 31.9* 31.9*  MCV 82.6 86.0 88.1  PLT 137* 87* 68*   Basic Metabolic Panel: Recent Labs  Lab 11/11/18 0130 11/11/18 0807 11/12/18 0317  NA 126*  --  136  K 3.8  --  3.3*  CL 86*  --  102  CO2 20*  --  26  GLUCOSE 90  --  92  BUN 6  --  <5*  CREATININE 0.60 0.66 0.60  CALCIUM 8.7*  --  8.4*   GFR: Estimated Creatinine Clearance: 101.1 mL/min (by C-G formula based on SCr of 0.6 mg/dL). Liver Function Tests: Recent Labs  Lab 11/11/18 0130 11/12/18 0317  AST 158* 63*  ALT 73* 44   ALKPHOS 110 86  BILITOT 0.6 0.8  PROT 7.8 6.5  ALBUMIN 4.4 3.9   No results for input(s): LIPASE, AMYLASE in the last 168 hours. No results for input(s): AMMONIA in the last 168 hours. Coagulation Profile: Recent Labs  Lab 11/12/18 0317  INR 1.0   Cardiac Enzymes: Recent Labs  Lab 11/11/18 0130  TROPONINI <0.03   BNP (last 3 results) No results for input(s): PROBNP in the last 8760 hours. HbA1C: No results for input(s): HGBA1C in the last 72 hours. CBG: No results for input(s): GLUCAP in the last 168 hours. Lipid Profile: No results for input(s): CHOL, HDL, LDLCALC, TRIG, CHOLHDL, LDLDIRECT in the last 72 hours. Thyroid Function Tests: Recent Labs    11/12/18 0547  TSH 5.715*   Anemia Panel: No results for input(s): VITAMINB12, FOLATE, FERRITIN, TIBC, IRON, RETICCTPCT in the last 72 hours. Urine analysis:    Component Value Date/Time   COLORURINE YELLOW 09/18/2018 2110   APPEARANCEUR  CLOUDY (A) 09/18/2018 2110   LABSPEC <1.005 (L) 09/18/2018 2110   PHURINE 6.5 09/18/2018 2110   GLUCOSEU NEGATIVE 09/18/2018 2110   HGBUR MODERATE (A) 09/18/2018 2110   BILIRUBINUR NEGATIVE 09/18/2018 2110   KETONESUR NEGATIVE 09/18/2018 2110   PROTEINUR NEGATIVE 09/18/2018 2110   UROBILINOGEN 1.0 02/26/2013 1916   NITRITE NEGATIVE 09/18/2018 2110   LEUKOCYTESUR LARGE (A) 09/18/2018 2110   Sepsis Labs: @LABRCNTIP (procalcitonin:4,lacticidven:4)  ) Recent Results (from the past 240 hour(s))  MRSA PCR Screening     Status: None   Collection Time: 11/11/18  7:51 AM  Result Value Ref Range Status   MRSA by PCR NEGATIVE NEGATIVE Final    Comment:        The GeneXpert MRSA Assay (FDA approved for NASAL specimens only), is one component of a comprehensive MRSA colonization surveillance program. It is not intended to diagnose MRSA infection nor to guide or monitor treatment for MRSA infections. Performed at [facility], [address] [place],  [place] [address]       Studies: No results found.  Scheduled Meds: . amLODipine  5 mg Oral Daily  . chlordiazePOXIDE  10 mg Oral TID  . DULoxetine  30 mg Oral Daily  . enoxaparin (LOVENOX) injection  40 mg Subcutaneous Q24H  . folic acid  1 mg Oral Daily  . levothyroxine  50 mcg Oral QAC breakfast  . LORazepam  0-4 mg Intravenous Q12H   Or  . LORazepam  0-4 mg Oral Q12H  . LORazepam  1 mg Intravenous Once  . multivitamin with minerals  1 tablet Oral Daily  . pantoprazole  40 mg Oral Daily  . thiamine  100 mg Oral Daily   Or  . thiamine  100 mg Intravenous Daily    Continuous Infusions: . sodium chloride 125 mL/hr at 11/13/18 0800     LOS: 2 days     Darlin Drop, MD [facility] Pager [phone]  If 7PM-7AM, please contact night-coverage [email or web address] Password [email or web address] 11/13/2018, 8:12 AM

## 2018-11-14 DIAGNOSIS — F419 Anxiety disorder, unspecified: Secondary | ICD-10-CM

## 2018-11-14 LAB — CBC WITH DIFFERENTIAL/PLATELET
Abs Immature Granulocytes: 0.02 10*3/uL (ref 0.00–0.07)
Basophils Absolute: 0 10*3/uL (ref 0.0–0.1)
Basophils Relative: 0 %
Eosinophils Absolute: 0.2 10*3/uL (ref 0.0–0.5)
Eosinophils Relative: 4 %
HCT: 33.1 % — ABNORMAL LOW (ref 36.0–46.0)
Hemoglobin: 9.8 g/dL — ABNORMAL LOW (ref 12.0–15.0)
Immature Granulocytes: 1 %
LYMPHS PCT: 40 %
Lymphs Abs: 1.6 10*3/uL (ref 0.7–4.0)
MCH: 26.7 pg (ref 26.0–34.0)
MCHC: 29.6 g/dL — AB (ref 30.0–36.0)
MCV: 90.2 fL (ref 80.0–100.0)
Monocytes Absolute: 0.2 10*3/uL (ref 0.1–1.0)
Monocytes Relative: 5 %
NRBC: 0 % (ref 0.0–0.2)
Neutro Abs: 2 10*3/uL (ref 1.7–7.7)
Neutrophils Relative %: 50 %
Platelets: 55 10*3/uL — ABNORMAL LOW (ref 150–400)
RBC: 3.67 MIL/uL — ABNORMAL LOW (ref 3.87–5.11)
RDW: 21.2 % — ABNORMAL HIGH (ref 11.5–15.5)
WBC: 4.1 10*3/uL (ref 4.0–10.5)

## 2018-11-14 LAB — COMPREHENSIVE METABOLIC PANEL
ALT: 39 U/L (ref 0–44)
AST: 43 U/L — ABNORMAL HIGH (ref 15–41)
Albumin: 3.6 g/dL (ref 3.5–5.0)
Alkaline Phosphatase: 68 U/L (ref 38–126)
Anion gap: 9 (ref 5–15)
BUN: 11 mg/dL (ref 6–20)
CO2: 24 mmol/L (ref 22–32)
Calcium: 8.3 mg/dL — ABNORMAL LOW (ref 8.9–10.3)
Chloride: 103 mmol/L (ref 98–111)
Creatinine, Ser: 0.73 mg/dL (ref 0.44–1.00)
GFR calc Af Amer: >60 mL/min (ref >60)
GFR calc non Af Amer: >60 mL/min (ref >60)
Glucose, Bld: 117 mg/dL — ABNORMAL HIGH (ref 70–99)
Potassium: 3.2 mmol/L — ABNORMAL LOW (ref 3.5–5.1)
Sodium: 136 mmol/L (ref 135–145)
TOTAL PROTEIN: 6.4 g/dL — AB (ref 6.5–8.1)
Total Bilirubin: 0.6 mg/dL (ref 0.3–1.2)

## 2018-11-14 LAB — URINE CULTURE: Culture: NO GROWTH

## 2018-11-14 LAB — GLUCOSE, CAPILLARY
Glucose-Capillary: 83 mg/dL (ref 70–99)
Glucose-Capillary: 87 mg/dL (ref 70–99)

## 2018-11-14 LAB — MAGNESIUM: Magnesium: 1.8 mg/dL (ref 1.7–2.4)

## 2018-11-14 MED ORDER — GABAPENTIN 300 MG PO CAPS
300.0000 mg | ORAL_CAPSULE | Freq: Three times a day (TID) | ORAL | Status: DC
  Administered 2018-11-14 – 2018-11-15 (×4): 300 mg via ORAL
  Filled 2018-11-14 (×4): qty 1

## 2018-11-14 MED ORDER — NICOTINE 21 MG/24HR TD PT24
21.0000 mg | MEDICATED_PATCH | Freq: Every day | TRANSDERMAL | Status: DC
  Administered 2018-11-14 – 2018-11-15 (×3): 21 mg via TRANSDERMAL
  Filled 2018-11-14 (×3): qty 1

## 2018-11-14 MED ORDER — POTASSIUM CHLORIDE CRYS ER 20 MEQ PO TBCR
40.0000 meq | EXTENDED_RELEASE_TABLET | Freq: Three times a day (TID) | ORAL | Status: AC
  Administered 2018-11-14 (×3): 40 meq via ORAL
  Filled 2018-11-14 (×3): qty 2

## 2018-11-14 MED ORDER — MAGNESIUM SULFATE 2 GM/50ML IV SOLN
2.0000 g | Freq: Once | INTRAVENOUS | Status: AC
  Administered 2018-11-14: 2 g via INTRAVENOUS
  Filled 2018-11-14: qty 50

## 2018-11-14 MED ORDER — MIRTAZAPINE 15 MG PO TABS
15.0000 mg | ORAL_TABLET | Freq: Every day | ORAL | Status: DC
  Administered 2018-11-14: 15 mg via ORAL
  Filled 2018-11-14: qty 1

## 2018-11-14 MED ORDER — POTASSIUM CHLORIDE CRYS ER 20 MEQ PO TBCR
40.0000 meq | EXTENDED_RELEASE_TABLET | Freq: Two times a day (BID) | ORAL | Status: DC
  Administered 2018-11-14: 40 meq via ORAL
  Filled 2018-11-14: qty 2

## 2018-11-14 NOTE — Progress Notes (Addendum)
PROGRESS NOTE  Julie Conley DZH:[id number] DOB: August 02, 1976 DOA: 11/11/2018 PCP: Patient, No Pcp Per  HPI/Recap of past 24 hours: The patient is a 43 yr old woman with a history of alcohol abuse and previous withdrawal that has resulted in seizures and has required intubation.  The patient states that she drinks about 2 bottles of wine daily. She says that her last drink was about 1500 on Thursday. She states that she began having tremors, nausea, and seeing spots 2 hours after that last drink. She then began to have chest pain and shortness of breath.   Upon her arrival in the ED  Alcohol level was 41.  Chest x-ray showed no acute cardiopulmonary disease. EKG demonstrated sinus tachycardia, no ischemic changes, and no changes since previous EKG. TRH asked to admit.  Started on CIWA protocol on admission.  11/14/18: Patient seen and examined her bedside.  Continues to report auditory and visual hallucinations.  States she has full conversation with her mother and sees her mother in the room.  Denies prior history of schizophrenia or psychotic disorder.  Will consult psychiatry to further evaluate.  Assessment/Plan: Principal Problem:   Alcohol withdrawal (HCC) Active Problems:   Hypothyroidism   Alcohol dependence with withdrawal with complication (HCC)   Hyponatremia   Chest pain   Depression   Alcoholic gastritis   Alcohol abuse   Abnormal LFTs   Withdrawal symptoms, alcohol, with delirium (HCC)   Alcoholic hepatitis without ascites  Alcohol intoxication with concern for alcohol withdrawal At baseline drinks 2 bottles of wine per day Presented with alcohol level of 41 Reports persistent visual and auditory hallucinations Continue CIWA protocol in place Continue folate and thiamine vitamins Continue gentle IV fluid hydration normal saline at 75 cc/h Consulted psych to further evaluate visual and auditory hallucinations Denies any prior history of schizophrenia or  psychosis  Hallucinations, visual and auditory States she continues to have conversation with her mother in the room, she hears her mother and sees her while not present in the room Denies history of schizophrenia or psychosis Psych consulted to evaluate  Chronic depression/anxiety with auditory and visual hallucination Continue Cymbalta Psych has been consulted for auditory and visual hallucinations  Lower urinary tract symptoms Reports intermittent discomfort when she urinates, states similar to prior UTI Recently treated for UTI 2 months ago UA unremarkable Urine culture pending  Resolving uncontrolled hypertension, possibly related to alcohol withdrawal versus underlying hypertension Blood pressure is well controlled on clonidine Not on antihypertensive medications at home Continue clonidine 0.1 mg BID for suspected alcohol withdrawal induced HTN Increase clonidine dose as indicated if uncontrolled HTN is persistent Continue to monitor vital signs  Persistent hypokalemia, repleted Repleted with KCl p.o. 40 mEq x 2 doses Also added 2 g of IV magnesium Repeat BMP in the morning  Elevated AST, suspect secondary to alcohol abuse AST continues to improve 43 from 63 from 158 on presentation  Alcohol cessation counseling at bedside  Pancytopenia suspect multifactorial secondary to chronic alcohol abuse superimposed by hemodilution WBC improved from 3.1-4.1 Hemoglobin stable at 9.8 Platelet dropped from 68K to 55K today  Chronic normocytic anemia No sign of overt bleeding Hemoglobin remained stable at 9.8  Hypothyroidism TSH 5.7 Continue levothyroxine  GERD Continue Protonix     Code Status: Full code  Family Communication: None at bedside  Disposition Plan: Home when clinically stable   Consultants:  Psychiatry  Procedures:  None  Antimicrobials:  None  DVT prophylaxis: SCDs.  Lovenox stopped due  to worsening  thrombocytopenia.   Objective: Vitals:   11/14/18 0400 11/14/18 0500 11/14/18 0600 11/14/18 0751  BP: 135/81 135/84 (!) 155/99 (!) 150/77  Pulse: 77 70 61 (!) 102  Resp: 16  12   Temp:      TempSrc:      SpO2: 96% 97% 98%   Weight:      Height:        Intake/Output Summary (Last 24 hours) at 11/14/2018 0831 Last data filed at 11/14/2018 0400 Gross per 24 hour  Intake 2018.49 ml  Output 300 ml  Net 1718.49 ml   Filed Weights   11/11/18 0056 11/11/18 0645 11/11/18 0646  Weight: 86.2 kg 94.5 kg 94.5 kg    Exam:  . General: 43 y.o. year-old female well-developed well-nourished in no acute distress.  Alert and oriented x3. . Cardiovascular: Regular rate and rhythm with no rubs or gallops.  No JVD or thyromegaly. Marland Kitchen Respiratory: Clear to auscultation with no wheezes or rales.  Good inspiratory effort. . Abdomen: Soft nontender nondistended with normal bowel sounds x4 quadrants. . Musculoskeletal: No lower extremity edema. 2/4 pulses in all 4 extremities. Marland Kitchen Psychiatry: Mood is appropriate for condition and setting.   Data Reviewed: CBC: Recent Labs  Lab 11/11/18 0130 11/11/18 0807 11/12/18 0317 11/14/18 0340  WBC 6.6 3.3* 3.1* 4.1  NEUTROABS 4.6  --   --  2.0  HGB 11.4* 10.1* 9.8* 9.8*  HCT 34.7* 31.9* 31.9* 33.1*  MCV 82.6 86.0 88.1 90.2  PLT 137* 87* 68* 55*   Basic Metabolic Panel: Recent Labs  Lab 11/11/18 0130 11/11/18 0807 11/12/18 0317 11/14/18 0340  NA 126*  --  136 136  K 3.8  --  3.3* 3.2*  CL 86*  --  102 103  CO2 20*  --  26 24  GLUCOSE 90  --  92 117*  BUN 6  --  <5* 11  CREATININE 0.60 0.66 0.60 0.73  CALCIUM 8.7*  --  8.4* 8.3*  MG  --   --   --  1.8   GFR: Estimated Creatinine Clearance: 101.1 mL/min (by C-G formula based on SCr of 0.73 mg/dL). Liver Function Tests: Recent Labs  Lab 11/11/18 0130 11/12/18 0317 11/14/18 0340  AST 158* 63* 43*  ALT 73* 44 39  ALKPHOS 110 86 68  BILITOT 0.6 0.8 0.6  PROT 7.8 6.5 6.4*  ALBUMIN 4.4 3.9  3.6   No results for input(s): LIPASE, AMYLASE in the last 168 hours. No results for input(s): AMMONIA in the last 168 hours. Coagulation Profile: Recent Labs  Lab 11/12/18 0317  INR 1.0   Cardiac Enzymes: Recent Labs  Lab 11/11/18 0130  TROPONINI <0.03   BNP (last 3 results) No results for input(s): PROBNP in the last 8760 hours. HbA1C: No results for input(s): HGBA1C in the last 72 hours. CBG: No results for input(s): GLUCAP in the last 168 hours. Lipid Profile: No results for input(s): CHOL, HDL, LDLCALC, TRIG, CHOLHDL, LDLDIRECT in the last 72 hours. Thyroid Function Tests: Recent Labs    11/12/18 0547  TSH 5.715*   Anemia Panel: No results for input(s): VITAMINB12, FOLATE, FERRITIN, TIBC, IRON, RETICCTPCT in the last 72 hours. Urine analysis:    Component Value Date/Time   COLORURINE YELLOW 11/13/2018 1243   APPEARANCEUR CLEAR 11/13/2018 1243   LABSPEC 1.009 11/13/2018 1243   PHURINE 6.0 11/13/2018 1243   GLUCOSEU NEGATIVE 11/13/2018 1243   HGBUR NEGATIVE 11/13/2018 1243   BILIRUBINUR NEGATIVE 11/13/2018 1243  KETONESUR NEGATIVE 11/13/2018 1243   PROTEINUR NEGATIVE 11/13/2018 1243   UROBILINOGEN 1.0 02/26/2013 1916   NITRITE NEGATIVE 11/13/2018 1243   LEUKOCYTESUR NEGATIVE 11/13/2018 1243   Sepsis Labs: @LABRCNTIP (procalcitonin:4,lacticidven:4)  ) Recent Results (from the past 240 hour(s))  MRSA PCR Screening     Status: None   Collection Time: 11/11/18  7:51 AM  Result Value Ref Range Status   MRSA by PCR NEGATIVE NEGATIVE Final    Comment:        The GeneXpert MRSA Assay (FDA approved for NASAL specimens only), is one component of a comprehensive MRSA colonization surveillance program. It is not intended to diagnose MRSA infection nor to guide or monitor treatment for MRSA infections. Performed at [facility], [address] [place], [place] [id number]       Studies: No results found.  Scheduled Meds: .  chlordiazePOXIDE  10 mg Oral TID  . Chlorhexidine Gluconate Cloth  6 each Topical Daily  . cloNIDine  0.1 mg Oral BID  . DULoxetine  30 mg Oral Daily  . enoxaparin (LOVENOX) injection  40 mg Subcutaneous Q24H  . folic acid  1 mg Oral Daily  . levothyroxine  50 mcg Oral QAC breakfast  . LORazepam  0-4 mg Intravenous Q12H   Or  . LORazepam  0-4 mg Oral Q12H  . LORazepam  1 mg Intravenous Once  . multivitamin with minerals  1 tablet Oral Daily  . nicotine  21 mg Transdermal Daily  . pantoprazole  40 mg Oral Daily  . potassium chloride  40 mEq Oral BID  . thiamine  100 mg Oral Daily   Or  . thiamine  100 mg Intravenous Daily    Continuous Infusions: . sodium chloride 75 mL/hr at 11/14/18 0400     LOS: 3 days     Darlin Droparole N Armie Moren, MD [facility] Pager [phone]  If 7PM-7AM, please contact night-coverage [email or web address] Password [email or web address] 11/14/2018, 8:31 AM
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
## 2018-11-15 LAB — GLUCOSE, CAPILLARY
Glucose-Capillary: 78 mg/dL (ref 70–99)
Glucose-Capillary: 115 mg/dL — ABNORMAL HIGH (ref 70–99)

## 2018-11-15 LAB — BASIC METABOLIC PANEL
ANION GAP: 6 (ref 5–15)
BUN: 17 mg/dL (ref 6–20)
CO2: 25 mmol/L (ref 22–32)
Calcium: 9 mg/dL (ref 8.9–10.3)
Chloride: 106 mmol/L (ref 98–111)
Creatinine, Ser: 0.75 mg/dL (ref 0.44–1.00)
GFR calc Af Amer: >60 mL/min (ref >60)
GFR calc non Af Amer: >60 mL/min (ref >60)
GLUCOSE: 95 mg/dL (ref 70–99)
Potassium: 4.4 mmol/L (ref 3.5–5.1)
Sodium: 137 mmol/L (ref 135–145)

## 2018-11-15 MED ORDER — CYCLOBENZAPRINE HCL 5 MG PO TABS: 5.0000 mg | ORAL_TABLET | Freq: Every day | ORAL | Status: DC | PRN

## 2018-11-15 MED ORDER — FOLIC ACID 1 MG PO TABS: 1.0000 mg | ORAL_TABLET | Freq: Every day | ORAL | 0 refills | Status: DC

## 2018-11-15 MED ORDER — CLONIDINE HCL 0.1 MG PO TABS: 0.1000 mg | ORAL_TABLET | Freq: Two times a day (BID) | ORAL | 0 refills | Status: DC

## 2018-11-15 MED ORDER — DULOXETINE HCL 30 MG PO CPEP: 30.0000 mg | ORAL_CAPSULE | Freq: Every day | ORAL | 0 refills | Status: DC

## 2018-11-15 MED ORDER — MIRTAZAPINE 15 MG PO TABS: 15.0000 mg | ORAL_TABLET | Freq: Every day | ORAL | 0 refills | Status: DC

## 2018-11-15 MED ORDER — CYCLOBENZAPRINE HCL 5 MG PO TABS
5.0000 mg | ORAL_TABLET | Freq: Once | ORAL | Status: AC
  Administered 2018-11-15: 5 mg via ORAL
  Filled 2018-11-15: qty 1

## 2018-11-15 MED ORDER — GABAPENTIN 300 MG PO CAPS: 300.0000 mg | ORAL_CAPSULE | Freq: Three times a day (TID) | ORAL | 0 refills | Status: DC

## 2018-11-15 MED ORDER — NICOTINE 21 MG/24HR TD PT24: 21.0000 mg | MEDICATED_PATCH | Freq: Every day | TRANSDERMAL | 0 refills | Status: DC

## 2018-11-15 MED ORDER — PANTOPRAZOLE SODIUM 40 MG PO TBEC: 40.0000 mg | DELAYED_RELEASE_TABLET | Freq: Every day | ORAL | 0 refills | Status: DC

## 2018-11-15 MED ORDER — LEVOTHYROXINE SODIUM 50 MCG PO TABS: 50.0000 ug | ORAL_TABLET | Freq: Every day | ORAL | 0 refills | Status: DC

## 2018-11-15 MED ORDER — THIAMINE HCL 100 MG PO TABS: 100.0000 mg | ORAL_TABLET | Freq: Every day | ORAL | 0 refills | Status: DC

## 2018-11-15 MED ORDER — POTASSIUM CHLORIDE CRYS ER 20 MEQ PO TBCR
40.0000 meq | EXTENDED_RELEASE_TABLET | Freq: Once | ORAL | Status: AC
  Administered 2018-11-15: 40 meq via ORAL
  Filled 2018-11-15: qty 2

## 2018-11-15 MED ORDER — ADULT MULTIVITAMIN W/MINERALS CH: 1.0000 | ORAL_TABLET | Freq: Every day | ORAL | 0 refills | Status: AC

## 2018-11-15 MED ORDER — CYCLOBENZAPRINE HCL 5 MG PO TABS: 5.0000 mg | ORAL_TABLET | Freq: Every day | ORAL | 0 refills | Status: DC | PRN

## 2018-11-15 NOTE — Discharge Instructions (Signed)
Alcohol Withdrawal Syndrome When a person who drinks a lot of alcohol stops drinking, he or she may have unpleasant and serious symptoms. These symptoms are called alcohol withdrawal syndrome. This condition may be mild or severe. It can be life-threatening. It can cause:  Shaking that you cannot control (tremor).  Sweating.  Headache.  Feeling fearful, upset, grouchy, or depressed.  Trouble sleeping (insomnia).  Nightmares.  Fast or uneven heartbeats (palpitations).  Alcohol cravings.  Feeling sick to your stomach (nausea).  Throwing up (vomiting).  Being bothered by light and sounds.  Confusion.  Trouble thinking clearly.  Not being hungry (loss of appetite).  Big changes in mood (mood swings). If you have all of the following symptoms at the same time, get help right away:  High blood pressure.  Fast heartbeat.  Trouble breathing.  Seizures.  Seeing, hearing, feeling, smelling, or tasting things that are not there (hallucinations). These symptoms are known as delirium tremens (DTs). They must be treated at the hospital right away. Follow these instructions at home:   Take over-the-counter and prescription medicines only as told by your doctor. This includes vitamins.  Do not drink alcohol.  Do not drive until your doctor says that this is safe for you.  Have someone stay with you or be available in case you need help. This should be someone you trust. This person can help you with your symptoms. He or she can also help you to not drink.  Drink enough fluid to keep your pee (urine) pale yellow.  Think about joining a support group or a treatment program to help you stop drinking.  Keep all follow-up visits as told by your doctor. This is important. Contact a doctor if:  Your symptoms get worse.  You cannot eat or drink without throwing up.  You have a hard time not drinking alcohol.  You cannot stop drinking alcohol. Get help right away  if:  You have fast or uneven heartbeats.  You have chest pain.  You have trouble breathing.  You have a seizure for the first time.  You see, hear, feel, smell, or taste something that is not there.  You get very confused. Summary  When a person who drinks a lot of alcohol stops drinking, he or she may have serious symptoms. This is called alcohol withdrawal syndrome.  Delirium tremens (DTs) is a group of life-threatening symptoms. You should get help right away if you have these symptoms.  Think about joining an alcohol support group or a treatment program. This information is not intended to replace advice given to you by your health care provider. Make sure you discuss any questions you have with your health care provider. Document Released: 02/10/2008 Document Revised: 04/30/2017 Document Reviewed: 04/30/2017 Elsevier Interactive Patient Education  2019 Else[employer] Inc[employer]   Alcohol Use Disorder Alcohol use disorder is when your drinking disrupts your daily life. When you have this condition, you drink too much alcohol and you cannot control your drinking. Alcohol use disorder can cause serious problems with your physical health. It can affect your brain, heart, liver, pancreas, immune system, stomach, and intestines. Alcohol use disorder can increase your risk for certain cancers and cause problems with your mental health, such as depression, anxiety, psychosis, delirium, and dementia. People with this disorder risk hurting themselves and others. What are the causes? This condition is caused by drinking too much alcohol over time. It is not caused by drinking too much alcohol only one or two times. Some people  with this condition drink alcohol to cope with or escape from negative life events. Others drink to relieve pain or symptoms of mental illness. What increases the risk? You are more likely to develop this condition if:  You have a family history of alcohol use disorder.  Your  culture encourages drinking to the point of intoxication, or makes alcohol easy to get.  You had a mood or conduct disorder in childhood.  You have been a victim of abuse.  You are an adolescent and: ? You have poor grades or difficulties in school. ? Your caregivers do not talk to you about saying no to alcohol, or supervise your activities. ? You are impulsive or you have trouble with self-control. What are the signs or symptoms? Symptoms of this condition include:  Drinkingmore than you want to.  Drinking for longer than you want to.  Trying several times to drink less or to control your drinking.  Spending a lot of time getting alcohol, drinking, or recovering from drinking.  Craving alcohol.  Having problems at work, at school, or at home due to drinking.  Having problems in relationships due to drinking.  Drinking when it is dangerous to drink, such as before driving a car.  Continuing to drink even though you know you might have a physical or mental problem related to drinking.  Needing more and more alcohol to get the same effect you want from the alcohol (building up tolerance).  Having symptoms of withdrawal when you stop drinking. Symptoms of withdrawal include: ? Fatigue. ? Nightmares. ? Trouble sleeping. ? Depression. ? Anxiety. ? Fever. ? Seizures. ? Severe confusion. ? Feeling or seeing things that are not there (hallucinations). ? Tremors. ? Rapid heart rate. ? Rapid breathing. ? High blood pressure.  Drinking to avoid symptoms of withdrawal. How is this diagnosed? This condition is diagnosed with an assessment. Your health care provider may start the assessment by asking three or four questions about your drinking. Your health care provider may perform a physical exam or do lab tests to see if you have physical problems resulting from alcohol use. She or he may refer you to a mental health professional for evaluation. How is this treated? Some  people with alcohol use disorder are able to reduce their alcohol use to low-risk levels. Others need to completely quit drinking alcohol. When necessary, mental health professionals with specialized training in substance use treatment can help. Your health care provider can help you decide how severe your alcohol use disorder is and what type of treatment you need. The following forms of treatment are available:  Detoxification. Detoxification involves quitting drinking and using prescription medicines within the first week to help lessen withdrawal symptoms. This treatment is important for people who have had withdrawal symptoms before and for heavy drinkers who are likely to have withdrawal symptoms. Alcohol withdrawal can be dangerous, and in severe cases, it can cause death. Detoxification may be provided in a home, community, or primary care setting, or in a hospital or substance use treatment facility.  Counseling. This treatment is also called talk therapy. It is provided by substance use treatment counselors. A counselor can address the reasons you use alcohol and suggest ways to keep you from drinking again or to prevent problem drinking. The goals of talk therapy are to: ? Find healthy activities and ways for you to cope with stress. ? Identify and avoid the things that trigger your alcohol use. ? Help you learn how to handle  cravings.  Medicines.Medicines can help treat alcohol use disorder by: ? Decreasing alcohol cravings. ? Decreasing the positive feeling you have when you drink alcohol. ? Causing an uncomfortable physical reaction when you drink alcohol (aversion therapy).  Support groups. Support groups are led by people who have quit drinking. They provide emotional support, advice, and guidance. These forms of treatment are often combined. Some people with this condition benefit from a combination of treatments provided by specialized substance use treatment centers. Follow these  instructions at home:  Take over-the-counter and prescription medicines only as told by your health care provider.  Check with your health care provider before starting any new medicines.  Ask friends and family members not to offer you alcohol.  Avoid situations where alcohol is served, including gatherings where others are drinking alcohol.  Create a plan for what to do when you are tempted to use alcohol.  Find hobbies or activities that you enjoy that do not include alcohol.  Keep all follow-up visits as told by your health care provider. This is important. How is this prevented?  If you drink, limit alcohol intake to no more than 1 drink a day for nonpregnant women and 2 drinks a day for men. One drink equals 12 oz of beer, 5 oz of wine, or 1 oz of hard liquor.  If you have a mental health condition, get treatment and support.  Do not give alcohol to adolescents.  If you are an adolescent: ? Do not drink alcohol. ? Do not be afraid to say no if someone offers you alcohol. Speak up about why you do not want to drink. You can be a positive role model for your friends and set a good example for those around you by not drinking alcohol. ? If your friends drink, spend time with others who do not drink alcohol. Make new friends who do not use alcohol. ? Find healthy ways to manage stress and emotions, such as meditation or deep breathing, exercise, spending time in nature, listening to music, or talking with a trusted friend or family member. Contact a health care provider if:  You are not able to take your medicines as told.  Your symptoms get worse.  You return to drinking alcohol (relapse) and your symptoms get worse. Get help right away if:  You have thoughts about hurting yourself or others. If you ever feel like you may hurt yourself or others, or have thoughts about taking your own life, get help right away. You can go to your nearest emergency department or call:  Your  local emergency services (911 in the [place]  A suicide crisis helpline, such as the National Suicide Prevention Lifeline at 417-324-2456. This is open 24 hours a day. Summary  Alcohol use disorder is when your drinking disrupts your daily life. When you have this condition, you drink too much alcohol and you cannot control your drinking.  Treatment may include detoxification, counseling, medicine, and support groups.  Ask friends and family members not to offer you alcohol. Avoid situations where alcohol is served.  Get help right away if you have thoughts about hurting yourself or others. This information is not intended to replace advice given to you by your health care provider. Make sure you discuss any questions you have with your health care provider. Document Released: 10/01/2004 Document Revised: 05/21/2016 Document Reviewed: 05/21/2016 Elsevier Interactive Patient Education  2019 Elsevier Inc.   Chemical Dependency Chemical dependency is an addiction to drugs or alcohol. People  with this addiction repeatedly seek out and use drugs or alcohol despite negative consequences to the health and safety of themselves and others. Addiction changes the way the brain works. Because of these changes, addiction is a chronic condition. The medical term for addiction or chemical dependency is substance use disorder. The disorder can be mild, moderate, or severe. People can be dependent on a range of substances. These include alcohol, prescription medicines, and illegal or street drugs, such as marijuana, heroin, and cocaine. What are the causes? This condition is caused by the effect that the abused substance has on the brain. What increases the risk? The following factors may make you more likely to develop this condition:  Havinga family history of chemical dependency.  Having mental health issues, such as depression, anxiety, or bipolar disorder.  Living in an environment where drugs  and alcohol are easily available.  Using drugs or alcohol at a young age.  Having friends who use drugs or alcohol.  Having poor social skills.  Tending to be aggressive or impulsive. What are the signs or symptoms? Symptoms may vary depending on the substance that you are addicted to. Symptoms may include the following: Physical Symptoms  The inability to limit the use of drugs or alcohol.  Having nausea, sweating, shakiness, and anxiety when you are not using alcohol or drugs.  Needing a greater amount of drugs or alcohol to get the same effect (developing tolerance).  A change in: ? Sleeping habits. ? Eating or appetite. ? Appearance or how you care for yourself. Emotional Symptoms  Angry outbursts.  Periods of sadness and tearfulness.  Isolation. Relationship Problems  Loved ones suggesting that you have a problem.  Increased fights.  Forgetting commitments.  Having affairs or one-night stands. Problems Related to Irresponsibility  Legal problems.  Irresponsibility with money.  Missing work.  Poor decision making. How is this diagnosed? This condition may be diagnosed based on your symptoms, your medical history, and a physical exam. You may also have blood tests and urine tests. How is this treated? Treatment for this condition depends on the substance that you are addicted to and whether your dependency is mild, moderate, or severe. Treatment options may include:  Stopping substance use safely. This may require taking medicines and being closely observed for several days.  Taking part in group and individual counseling with mental health providers who help people with chemical dependency.  Staying at a residential treatment center for several days or weeks.  Attending daily counseling sessions at a treatment center.  Taking medicine as told by your health care provider to: ? Ease symptoms and prevent complications during withdrawal. ? Treat other  mental health issues, such as depression or anxiety. ? Block cravings by causing the same effects as the substance. ? Block the effects of the substance or replace good sensations with unpleasant ones.  Going to a support group to share your experience with others who are going through the same thing. These groups are an important part of long-term recovery for many people. They include 12-step programs like Alcoholics Anonymous and Narcotics Anonymous.  Recovery can be a long process. Many people who undergo treatment start using the substance again after stopping. This is called a relapse. If you have a relapse, it does not mean that treatment will not work. Follow these instructions at home:  Avoid temptations or triggers that you associate with your use of the substance.  Learn and practice techniques for managing stress.  Have a  plan for vulnerable moments.  Get phone numbers of those who are willing to help and who are committed to your recovery.  Know when and where the meetings that you have chosen will occur.  Take over-the-counter and prescription medicines only as told by your health care provider.  Keep all follow-up visits as told by your health care provider. This is important. Contact a health care provider if:  You cannot take your medicines as told.  Your symptoms get worse.  You have trouble resisting the urge to use drugs or alcohol.  You are in pain, shaking, sweating, or feeling generally unwell.  You are losing weight without trying to. Get help right away if:  You lose consciousness.  Your breathing is slow.  Your pulse is slow or jumpy.  You have serious thoughts about hurting yourself or someone else.  You have a relapse. This information is not intended to replace advice given to you by your health care provider. Make sure you discuss any questions you have with your health care provider. Document Released: 08/18/2001 Document Revised: 09/30/2015  Document Reviewed: 05/01/2015 Elsevier Interactive Patient Education  2019 [employer].   Finding Treatment for Addiction Addiction is a complex disease of the brain that causes an uncontrollable (compulsive) need for:  A substance. This includes alcohol, illegal drugs, or prescription medicines, such as painkillers.  An activity or behavior, such as gambling or shopping. Addiction changes the way your brain works. Because of this change:  The need for the medicine, drug, or activity can become so strong that you think about it all the time.  Getting more and more of your addiction becomes the most important thing to you.  You may find yourself leaving other activities and relationships to pursue your addiction.  You can become physically dependent on a substance.  Your health, behavior, emotions, and relationships can change for the worse. How do I know if I need treatment for addiction? Addiction is a progressive disease. Without treatment, addiction can get worse. Living with addiction puts you at higher risk for injury, poor health, loss of employment, loss of money, and even death. You might need treatment for addiction if:  You have tried to stop or cut down, but you have not succeeded.  You find it annoying that your friends and family are concerned about your use or behavior.  You feel guilty about your use or behavior.  You need a particular substance or activity to start your day or to calm down.  You are running out of money because of your addiction.  You have done something illegal to support your addiction.  Your addiction has caused you: ? Health problems. ? Trouble in school, work, home, or with the police. ? To devote all your time to your addiction, and not to other responsibilities. ? To tell lies in order to hide your problem. What types of treatment are available? There may be options for treatment programs and plans based on your addiction,  condition, needs, and preferences. No single treatment is right for everyone.  Treatment programs can be: ? Outpatient. You live at home and go to work or school, but you go to a clinic for treatment. ? Inpatient. You live and sleep at the program facility during treatment.  Programs may include: ? Medicine. You may need medicine to treat the addiction itself, or to treat anxiety or depression. ? Counseling and behavior therapy. This can help individuals and families behave in healthier ways and relate  more effectively. ? Support groups. Confidential group therapy, such as a 12-step program, can help individuals and families during treatment and recovery. ? A combination of education, counseling, and a 12-step, spirituality-based approach. What should I consider when selecting a treatment program? Think about your individual requirements when selecting a treatment program. Ask about:  The overall approach to treatment. ? Some programs are strictly 12-step programs. Some have a more flexible approach. ? Programs may differ in length of stay, setting, and size. ? Some programs include your family in your treatment plan. Support may be offered to them throughout the treatment process, as well as instructions for them when you are discharged. ? You may continue to receive support after you have left the program.  The types of medical services that are offered. Find out if the program: ? Offers specific treatment for your particular addiction. ? Meets all of your needs, including physical and cultural needs. ? Includes any medicines you might need. ? Offers mental health counseling as part of your treatment. ? Offers the 12-step meetings at the center, or if transport is available for patients to attend meetings at other locations.  The cost and types of insurance that are accepted. ? Some programs are sponsored by the government. They support patients who do not have private insurance. ? If  you do not have insurance, or if you choose to attend a program that does not accept your insurance, call the treatment center. Tell them your financial needs and whether a payment plan can be set up. ? There are also organizations that will help you find the resources for treatment. You can find them online by searching "treatment for addiction."  If the program is certified by the appropriate government agency. Where to find support  Your health care provider can help you to find the right treatment. These discussions are confidential.  The [employer] on Alcoholism and Drug Dependence (NCADD). This group has information about treatment centers and programs for people who have an addiction and for family members. ? Call: 1-800-NCA-CALL (404-765-9229). ? Visit the website: https://www.ncadd.org/  The Substance Abuse and Mental Health Services [facility]). This organization will help you find publicly funded treatment centers, help hotlines, and counseling services near you. ? Call: 1-800-662-HELP (939 240 2547). ? Visit the website: www.findtreatment.[employer].pl  The National Problem Gambling Helpline. This is a 24-hour confidential helpline for gambling addiction. ? Call: 248-027-8092 ? Visit the website: [employer] In countries outside of the [place] and [place], look in [employer] for contact information for services in your area. Follow these instructions at home:  Find supportive people who will help you stay away from your addiction and stay sober.  Do not use the substance or engage in the activity.  If you have been through treatment: ? Follow your plan. The plan is usually developed by you and your health care provider during treatment. ? Go to meetings with other people in recovery. ? Avoid people, situations, and things that lead you to do the things you are addicted to (triggers). Summary  Addiction changes the way your brain  works. These changes cause a desire to repeat and increase the use of the a substance or behavior.  Addiction is a progressive disease. Without treatment, addiction can get worse. Living with addiction puts you at higher risk for injury, poor health, loss of employment, loss of money, and even death.  There may be options for treatment programs and plans based on your addiction, condition, needs, and preferences.  No single treatment is right for everyone.  Your health care provider can help you to find the right treatment. These discussions are confidential. This information is not intended to replace advice given to you by your health care provider. Make sure you discuss any questions you have with your health care provider. Document Released: 07/23/2005 Document Revised: 09/22/2017 Document Reviewed: 09/22/2017 Elsevier Interactive Patient Education  2019 [employer].

## 2018-11-15 NOTE — Plan of Care (Signed)
  Problem: Nutrition: Goal: Adequate nutrition will be maintained Outcome: Progressing   Problem: Safety: Goal: Ability to remain free from injury will improve Outcome: Progressing   Problem: Pain Managment: Goal: General experience of comfort will improve Outcome: Progressing   

## 2018-11-15 NOTE — Care Management (Signed)
CM consult for medication assistance. Pt states that she has medication coverage and receives her medications from [facility]. Sandford Craze RN,BSN [phone]

## 2018-11-15 NOTE — Discharge Summary (Addendum)
Discharge Summary  Julie Conley ZOX:[id number] DOB: [birth year]  PCP: Patient, No Pcp Per  Admit date: 11/11/2018 Discharge date: 11/15/2018  Time spent: 35 minutes  Recommendations for Outpatient Follow-up:  1. Follow-up with your primary care provider 2. Take your medications as prescribed 3. Completely abstain from alcohol use  Discharge Diagnoses:  Active Hospital Problems   Diagnosis Date Noted  . Anxiety   . Abnormal LFTs 11/11/2018  . Withdrawal symptoms, alcohol, with delirium (HCC) 11/11/2018  . Alcoholic hepatitis without ascites 11/11/2018  . Alcohol abuse   . Alcohol withdrawal (HCC) 06/14/2017  . 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 Chest pain 03/20/2016  . Depression 03/20/2016  . Hyponatremia 01/20/2016  . Alcohol dependence with withdrawal with complication (HCC) 11/05/2014  . Hypothyroidism 11/20/2011    Resolved Hospital Problems  No resolved problems to display.    Discharge Condition: Stable  Diet recommendation: Resume previous diet  Vitals:   11/15/18 0536 11/15/18 0810  BP: (!) 115/103 110/82  Pulse: 84 90  Resp: 16 20  Temp: (!) 97.5 F (36.4 C)   SpO2: 96% 98%    History of present illness:  The patient is a 43 yr old woman with a history of alcohol abuse and previous withdrawal that has resulted in seizures and has required intubation. The patient states that she drinks about 2 bottles of wine daily. She says that her last drink was about 1500 on Thursday 11/10/18. She states that she began having tremors, nausea, and seeing spots 2 hours after that last drink. She then began to have chest pain and shortness of breath.   Upon her arrival in the ED  Alcohol level was 41.  Chest x-ray showed no acute cardiopulmonary disease. EKG demonstrated sinus tachycardia, no ischemic changes, and no changes since previous EKG. TRH asked to admit.  Started on CIWA protocol on admission.  Hospital course complicated by self-reports of visual and  auditory hallucinations for which psychiatry was consulted.  Also uncontrolled hypertension which resolved with p.o. clonidine 0.1 mg twice daily.  11/15/18: Patient seen and examined at bedside.  No acute events overnight.  Vital signs and lab studies reviewed and are stable.  Seen by psychiatry yesterday.  Per psych, patient denies visual and auditory hallucinations.  Started on Remeron 15 mg nightly.  This morning the patient has no complaints.  On the day of discharge, the patient was hemodynamically stable.  She will need to follow-up with her primary care provider and outpatient psychiatrist/therapist posthospitalization.  Case manager has been consulted to assist with resources for psychiatrist and therapists.  Per psych: She declines substance abuse resources and reports her own plan to maintain sobriety by attending AA and being held accountable by a family friend.      Hospital Course:  Principal Problem:   Anxiety Active Problems:   Hypothyroidism   Alcohol dependence with withdrawal with complication (HCC)   Hyponatremia   Chest pain   Depression   Alcoholic gastritis   Alcohol withdrawal (HCC)   Alcohol abuse   Abnormal LFTs   Withdrawal symptoms, alcohol, with delirium (HCC)   Alcoholic hepatitis without ascites  Alcohol intoxication with concern for alcohol withdrawal At baseline drinks 2 bottles of wine per day Presented with alcohol level of 41 Self-reported visual and auditory hallucinations for which psychiatry was consulted Denied visual or auditory hallucinations per psych Completed CIWA protocol  Continue multivitamins, folate, and thiamine Completely abstain from alcohol use Follow-up with your psychiatrist/therapist Recommend following up with AA agency  Right upper extremity muscle spasm post surgery Prior right shoulder surgery Flexeril 5 mg daily as needed for spasm Follow-up with your PCP  Resolved hallucinations, visual and auditory Possibly  secondary to her alcohol withdrawal  Chronic depression/anxiety  Continue Cymbalta Remeron added on 11/14/2018 by psych 15 mg nightly  Resolved lower urinary tract symptoms Negative UA and negative urine culture  Resolved uncontrolled hypertension, possibly related to alcohol withdrawal versus underlying hypertension Blood pressure is normotensive on clonidine 0.1 mg twice daily Follow-up with your PCP  Resolved hypokalemia post repletion Presented with potassium 3.2  Resolving elevated AST, suspect secondary to alcohol abuse AST continues to decline 43 from 63 from 158 on presentation  Completely abstain from alcohol use Follow-up with your PCP  Acute on chronic thrombocytopenia suspect multifactorial secondary to alcohol abuse superimposed by hemodilution Platelet 55K from 68K No sign of overt bleeding Follow-up with your PCP  Chronic normocytic anemia No sign of overt bleeding Hemoglobin remained stable at 9.8 with MCV 90  Hypothyroidism TSH 5.7 Continue levothyroxine  GERD Continue Protonix     Code Status: Full code   Consultants:  Psychiatry  Procedures:  None  Antimicrobials:  None     Discharge Exam: BP 110/82 (BP Location: Left Arm)   Pulse 90   Temp (!) 97.5 F (36.4 C) (Oral)   Resp 20   Ht  (1.626 m)   Wt 94.5 kg   SpO2 98%   BMI 35.76 kg/m  . General: 43 y.o. year-old female well developed well nourished in no acute distress.  Alert and oriented x3. . Cardiovascular: Regular rate and rhythm with no rubs or gallops.  No thyromegaly or JVD noted.   Marland Kitchen Respiratory: Clear to auscultation with no wheezes or rales. Good inspiratory effort. . Abdomen: Soft nontender nondistended with normal bowel sounds x4 quadrants. . Musculoskeletal: No lower extremity edema. 2/4 pulses in all 4 extremities. . Skin: No ulcerative lesions noted or rashes, . Psychiatry: Mood is appropriate for condition and setting  Discharge  Instructions You were cared for by a hospitalist during your hospital stay. If you have any questions about your discharge medications or the care you received while you were in the hospital after you are discharged, you can call the unit and asked to speak with the hospitalist on call if the hospitalist that took care of you is not available. Once you are discharged, your primary care physician will handle any further medical issues. Please note that NO REFILLS for any discharge medications will be authorized once you are discharged, as it is imperative that you return to your primary care physician (or establish a relationship with a primary care physician if you do not have one) for your aftercare needs so that they can reassess your need for medications and monitor your lab values.   Allergies as of 11/15/2018      Reactions   Adhesive [tape] Other (See Comments)   TEARS SKIN   Ambien [zolpidem Tartrate] Other (See Comments)   Delirium, disorientation   Zofran [ondansetron]    "doesn't work"   Latex Rash   Sulfa Antibiotics Itching      Medication List    STOP taking these medications   benzonatate 100 MG capsule Commonly known as:  TESSALON   chlordiazePOXIDE 25 MG capsule Commonly known as:  LIBRIUM   doxycycline 100 MG capsule Commonly known as:  VIBRAMYCIN   erythromycin ophthalmic ointment   Gerhardt's butt cream Crea   guaiFENesin 100 MG/5ML  liquid Commonly known as:  ROBITUSSIN   metoprolol tartrate 25 MG tablet Commonly known as:  LOPRESSOR   promethazine 25 MG tablet Commonly known as:  PHENERGAN     TAKE these medications   cloNIDine 0.1 MG tablet Commonly known as:  CATAPRES Take 1 tablet (0.1 mg total) by mouth 2 (two) times daily.   cyclobenzaprine 5 MG tablet Commonly known as:  FLEXERIL Take 1 tablet (5 mg total) by mouth daily as needed for muscle spasms.   DULoxetine 30 MG capsule Commonly known as:  CYMBALTA Take 1 capsule (30 mg total) by  mouth daily.   folic acid 1 MG tablet Commonly known as:  FOLVITE Take 1 tablet (1 mg total) by mouth daily.   gabapentin 300 MG capsule Commonly known as:  NEURONTIN Take 1 capsule (300 mg total) by mouth 3 (three) times daily.   ibuprofen 200 MG tablet Commonly known as:  ADVIL,MOTRIN Take 800 mg by mouth every 6 (six) hours as needed for headache or moderate pain.   levothyroxine 50 MCG tablet Commonly known as:  SYNTHROID, LEVOTHROID Take 1 tablet (50 mcg total) by mouth daily before breakfast.   mirtazapine 15 MG tablet Commonly known as:  REMERON Take 1 tablet (15 mg total) by mouth at bedtime.   multivitamin with minerals Tabs tablet Take 1 tablet by mouth daily.   nicotine 21 mg/24hr patch Commonly known as:  NICODERM CQ - dosed in mg/24 hours Place 1 patch (21 mg total) onto the skin daily.   pantoprazole 40 MG tablet Commonly known as:  PROTONIX Take 1 tablet (40 mg total) by mouth daily.   thiamine 100 MG tablet Take 1 tablet (100 mg total) by mouth daily.      Allergies  Allergen Reactions  . Adhesive [Tape] Other (See Comments)    TEARS SKIN  . Ambien [Zolpidem Tartrate] Other (See Comments)    Delirium, disorientation  . Zofran [Ondansetron]     "doesn't work"  . Latex Rash  . Sulfa Antibiotics Itching   Follow-up Information    [facility]. Call in 1 day(s).   Why:  Please call for a post hospital follow-up appointment. Contact information: [address] [place] [address] [phone]       [facility]. Call in 1 day(s).   Specialty:  Behavioral Health Why:  Please call for a post hospital follow-up appointment. Contact information: [address] [place] [address] [phone]            The results of significant diagnostics from this hospitalization (including imaging, microbiology, ancillary and laboratory) are listed below for reference.    Significant Diagnostic  Studies: Dg Chest 2 View  Result Date: 11/11/2018 CLINICAL DATA:  Alcohol withdrawal times 10 hours. Mid chest pain and dyspnea. EXAM: CHEST - 2 VIEW COMPARISON:  09/18/2018 FINDINGS: Normal heart size and mediastinal contours. Lungs are clear. Plate and screw fixation across the included proximal right humerus. No acute osseous appearing abnormality. IMPRESSION: No active cardiopulmonary disease. Electronically Signed   By: Tollie Eth M.D.   On: 11/11/2018 02:10    Microbiology: Recent Results (from the past 240 hour(s))  MRSA PCR Screening     Status: None   Collection Time: 11/11/18  7:51 AM  Result Value Ref Range Status   MRSA by PCR NEGATIVE NEGATIVE Final    Comment:        The GeneXpert MRSA Assay (FDA approved for NASAL specimens only), is  one component of a comprehensive MRSA colonization surveillance program. It is not intended to diagnose MRSA infection nor to guide or monitor treatment for MRSA infections. Performed at [facility], [address], [place], [place] [address]   Culture, Urine     Status: None   Collection Time: 11/13/18 12:43 PM  Result Value Ref Range Status   Specimen Description   Final    URINE, CLEAN CATCH Performed at [facility], [address], [place], [place] [address]    Special Requests   Final    NONE Performed at [facility], [address] [place], [place] [address]    Culture   Final    NO GROWTH Performed at [facility], [address], [place], [place] [address]    Report Status 11/14/2018 FINAL  Final     Labs: Basic Metabolic Panel: Recent Labs  Lab 11/11/18 0130 11/11/18 0807 11/12/18 0317 11/14/18 0340 11/15/18 0808  NA 126*  --  136 136 137  K 3.8  --  3.3* 3.2* 4.4  CL 86*  --  102 103 106  CO2 20*  --  26 24 25   GLUCOSE 90  --  92 117* 95  BUN 6  --  <5* 11 17  CREATININE 0.60 0.66 0.60 0.73 0.75  CALCIUM 8.7*  --  8.4* 8.3* 9.0  MG  --    --   --  1.8  --    Liver Function Tests: Recent Labs  Lab 11/11/18 0130 11/12/18 0317 11/14/18 0340  AST 158* 63* 43*  ALT 73* 44 39  ALKPHOS 110 86 68  BILITOT 0.6 0.8 0.6  PROT 7.8 6.5 6.4*  ALBUMIN 4.4 3.9 3.6   No results for input(s): LIPASE, AMYLASE in the last 168 hours. No results for input(s): AMMONIA in the last 168 hours. CBC: Recent Labs  Lab 11/11/18 0130 11/11/18 0807 11/12/18 0317 11/14/18 0340  WBC 6.6 3.3* 3.1* 4.1  NEUTROABS 4.6  --   --  2.0  HGB 11.4* 10.1* 9.8* 9.8*  HCT 34.7* 31.9* 31.9* 33.1*  MCV 82.6 86.0 88.1 90.2  PLT 137* 87* 68* 55*   Cardiac Enzymes: Recent Labs  Lab 11/11/18 0130  TROPONINI <0.03   BNP: BNP (last 3 results) No results for input(s): BNP in the last 8760 hours.  ProBNP (last 3 results) No results for input(s): PROBNP in the last 8760 hours.  CBG: Recent Labs  Lab 11/14/18 1648 11/14/18 2121 11/15/18 0727  GLUCAP 83 87 78       Signed:  Darlin Drop, MD [facility] 11/15/2018, 11:31 AM

## 2018-12-25 ENCOUNTER — Encounter (HOSPITAL_BASED_OUTPATIENT_CLINIC_OR_DEPARTMENT_OTHER): Payer: Self-pay | Admitting: Emergency Medicine

## 2018-12-25 ENCOUNTER — Other Ambulatory Visit: Payer: Self-pay

## 2018-12-25 ENCOUNTER — Emergency Department (HOSPITAL_BASED_OUTPATIENT_CLINIC_OR_DEPARTMENT_OTHER)
Admission: EM | Admit: 2018-12-25 | Discharge: 2018-12-25 | Disposition: A | Discharge status: Home / Self Care | Attending: Emergency Medicine | Admitting: Emergency Medicine

## 2018-12-25 DIAGNOSIS — L509 Urticaria, unspecified: Secondary | ICD-10-CM

## 2018-12-25 DIAGNOSIS — F17210 Nicotine dependence, cigarettes, uncomplicated: Secondary | ICD-10-CM | POA: Insufficient documentation

## 2018-12-25 DIAGNOSIS — Z79899 Other long term (current) drug therapy: Secondary | ICD-10-CM | POA: Insufficient documentation

## 2018-12-25 DIAGNOSIS — R21 Rash and other nonspecific skin eruption: Secondary | ICD-10-CM | POA: Diagnosis present

## 2018-12-25 DIAGNOSIS — E039 Hypothyroidism, unspecified: Secondary | ICD-10-CM | POA: Insufficient documentation

## 2018-12-25 DIAGNOSIS — L500 Allergic urticaria: Secondary | ICD-10-CM | POA: Insufficient documentation

## 2018-12-25 DIAGNOSIS — T7840XA Allergy, unspecified, initial encounter: Secondary | ICD-10-CM

## 2018-12-25 MED ORDER — FAMOTIDINE 20 MG PO TABS
40.0000 mg | ORAL_TABLET | Freq: Once | ORAL | Status: AC
  Administered 2018-12-25: 11:00:00 40 mg via ORAL
  Filled 2018-12-25: qty 2

## 2018-12-25 MED ORDER — PREDNISONE 20 MG PO TABS: 60.0000 mg | ORAL_TABLET | Freq: Every day | ORAL | 0 refills | Status: AC

## 2018-12-25 MED ORDER — HYDROXYZINE HCL 25 MG PO TABS: 25.0000 mg | ORAL_TABLET | Freq: Three times a day (TID) | ORAL | 0 refills | Status: DC | PRN

## 2018-12-25 MED ORDER — FAMOTIDINE 40 MG PO TABS: 40.0000 mg | ORAL_TABLET | Freq: Every day | ORAL | 0 refills | Status: DC

## 2018-12-25 MED ORDER — EPINEPHRINE 0.3 MG/0.3ML IJ SOAJ: 0.3000 mg | INTRAMUSCULAR | 0 refills | Status: DC | PRN

## 2018-12-25 MED ORDER — HYDROCODONE-ACETAMINOPHEN 5-325 MG PO TABS
2.0000 | ORAL_TABLET | Freq: Once | ORAL | Status: AC
  Administered 2018-12-25: 11:00:00 2 via ORAL
  Filled 2018-12-25: qty 2

## 2018-12-25 MED ORDER — HYDROXYZINE HCL 25 MG PO TABS
50.0000 mg | ORAL_TABLET | Freq: Once | ORAL | Status: AC
  Administered 2018-12-25: 11:00:00 50 mg via ORAL
  Filled 2018-12-25: qty 2

## 2018-12-25 MED ORDER — PREDNISONE 50 MG PO TABS
60.0000 mg | ORAL_TABLET | Freq: Once | ORAL | Status: AC
  Administered 2018-12-25: 60 mg via ORAL
  Filled 2018-12-25: qty 1

## 2018-12-25 NOTE — ED Triage Notes (Addendum)
Woke up during the night with pruritic rash on face and buttock and shoulders.  Now areas are burning.  No hx of skin reactions.  Has not used any new products or meds.  Pt sts that she took herself off of "all my meds because they weren't working".... in March.  Took Benadryl 50mg  at 0300 this morning.

## 2018-12-25 NOTE — ED Notes (Signed)
ED Provider at bedside. 

## 2018-12-25 NOTE — ED Provider Notes (Signed)
[facility] EMERGENCY DEPARTMENT Provider Note   CSN: [id number] Arrival date & time: 12/25/18  0957    History   Chief Complaint Chief Complaint  Patient presents with  . Rash    HPI Julie Conley is a 43 y.o. female.     HPI 43 year old female here with rash.  The patient states she was in her usual state of health until around 230 this morning.  She was staying at her mother's house.  She states that she began to develop an itchy, painful rash around her buttocks, the trunk, and now it is diffuse.  It is raised, primarily pruritic, though she has some areas of pain after scratching too much.  Denies any open wounds.  No lip or tongue swelling.  No shortness of breath or wheezing.  She does note she used a new body wash as well as took a new generic Excedrin with naproxen prior to the onset of her rash.  No history of previous allergies.  She does have multiple tattoos and had some play several weeks ago but has not had any itching or pain since then.  No fevers or chills.  No cough or shortness of breath.  No history of anaphylaxis.  Past Medical History:  Diagnosis Date  . Alcohol abuse   . Anxiety   . Bimalleolar fracture of left ankle 04/2015  . Complication of anesthesia    hypotension with breast augmentation  . Depression   . GERD (gastroesophageal reflux disease)   . Headache    "once/week" (03/20/2016)  . Hypothyroidism 2010-2012   "stopped RX in 2012 when I didn't meet standards" (03/20/2016)  . Iron deficiency anemia   . Migraines    "once/month" (03/20/2016)  . Pneumonia "several times"  . Sciatic pain 04/12/2015   left    Patient Active Problem List   Diagnosis Date Noted  . Anxiety   . Abnormal LFTs 11/11/2018  . Withdrawal symptoms, alcohol, with delirium (HCC) 11/11/2018  . Alcoholic hepatitis without ascites 11/11/2018  . GERD (gastroesophageal reflux disease) 07/24/2017  . Alcohol abuse   . 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 Pressure injury of skin 07/21/2017  . Acute encephalopathy 07/20/2017  . Thrombocytopenia (HCC) 06/14/2017  . Alcohol withdrawal (HCC) 06/14/2017  . 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 Alcohol withdrawal syndrome without complication (HCC) 04/24/2016  . Sinus tachycardia 04/24/2016  . Normocytic anemia 04/24/2016  . Alcoholic ketoacidosis 03/20/2016  . Chest pain 03/20/2016  . Alcohol consumption binge drinking 03/20/2016  . Depression 03/20/2016  . Gastritis 03/20/2016  . 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 Elevated lipase   . Hypokalemia 01/20/2016  . Hyperglycemia 01/20/2016  . Hyponatremia 01/20/2016  . Alcohol dependence with withdrawal with complication (HCC) 11/05/2014  . 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 Hypothyroidism 11/20/2011    Past Surgical History:  Procedure Laterality Date  . ABDOMINOPLASTY  2015  . APPENDECTOMY  ~ 1986  . AUGMENTATION MAMMAPLASTY  2015  . CESAREAN SECTION  1996; 2000  . CYSTOSCOPY WITH HYDRODISTENSION AND BIOPSY  2010  . DILATION AND CURETTAGE OF UTERUS  2010  . ENDOMETRIAL ABLATION  2010  . FRACTURE SURGERY    . LAPAROSCOPIC CHOLECYSTECTOMY  2006  . LAPAROSCOPIC TUBAL LIGATION  2001   w/banding  . LAPAROTOMY  2011  . NASAL SEPTOPLASTY W/ TURBINOPLASTY  1991; 2008  . ORIF ANKLE FRACTURE Left 04/15/2015   Procedure: OPEN REDUCTION INTERNAL FIXATION (ORIF) LEFT BIMALLEOLAR AND SYNDESMOSIS ANKLE FRACTURE;  Surgeon: Marcial Pacas  Jamison Neighbor Murphy, MD;  Location: [facility];  Service: Orthopedics;  Laterality: Left;  . ROUX-EN-Y GASTRIC BYPASS  2009  . SHOULDER ARTHROSCOPY    . TONSILLECTOMY  ~ 1981  . VAGINAL HYSTERECTOMY  2013   complete     OB History   No obstetric history on file.      Home Medications    Prior to Admission medications   Medication Sig Start Date End Date Taking? Authorizing Provider  cloNIDine (CATAPRES) 0.1 MG tablet Take 1 tablet (0.1 mg total) by mouth 2 (two) times daily. 11/15/18   Darlin DropHall, Carole  N, DO  cyclobenzaprine (FLEXERIL) 5 MG tablet Take 1 tablet (5 mg total) by mouth daily as needed for muscle spasms. 11/15/18   Darlin DropHall, Carole N, DO  DULoxetine (CYMBALTA) 30 MG capsule Take 1 capsule (30 mg total) by mouth daily. 11/15/18   Darlin DropHall, Carole N, DO  EPINEPHrine 0.3 mg/0.3 mL IJ SOAJ injection Inject 0.3 mLs (0.3 mg total) into the muscle as needed for anaphylaxis. 12/25/18   Shaune PollackIsaacs, Lathyn Griggs, MD  famotidine (PEPCID) 40 MG tablet Take 1 tablet (40 mg total) by mouth daily for 5 days. 12/25/18 12/30/18  Shaune PollackIsaacs, Fatin Bachicha, MD  folic acid (FOLVITE) 1 MG tablet Take 1 tablet (1 mg total) by mouth daily. 11/15/18   Darlin DropHall, Carole N, DO  gabapentin (NEURONTIN) 300 MG capsule Take 1 capsule (300 mg total) by mouth 3 (three) times daily. 11/15/18   Darlin DropHall, Carole N, DO  hydrOXYzine (ATARAX/VISTARIL) 25 MG tablet Take 1-2 tablets (25-50 mg total) by mouth every 8 (eight) hours as needed for itching. 12/25/18   Shaune PollackIsaacs, Vittorio Mohs, MD  ibuprofen (ADVIL,MOTRIN) 200 MG tablet Take 800 mg by mouth every 6 (six) hours as needed for headache or moderate pain.    [provider]  levothyroxine (SYNTHROID, LEVOTHROID) 50 MCG tablet Take 1 tablet (50 mcg total) by mouth daily before breakfast. 11/15/18   Darlin DropHall, Carole N, DO  mirtazapine (REMERON) 15 MG tablet Take 1 tablet (15 mg total) by mouth at bedtime. 11/15/18   Darlin DropHall, Carole N, DO  Multiple Vitamin (MULTIVITAMIN WITH MINERALS) TABS tablet Take 1 tablet by mouth daily. 11/15/18   Darlin DropHall, Carole N, DO  nicotine (NICODERM CQ - DOSED IN MG/24 HOURS) 21 mg/24hr patch Place 1 patch (21 mg total) onto the skin daily. 11/15/18   Darlin DropHall, Carole N, DO  pantoprazole (PROTONIX) 40 MG tablet Take 1 tablet (40 mg total) by mouth daily. 11/15/18   Darlin DropHall, Carole N, DO  predniSONE (DELTASONE) 20 MG tablet Take 3 tablets (60 mg total) by mouth daily for 4 days. 12/25/18 12/29/18  Shaune PollackIsaacs, Ariela Mochizuki, MD  thiamine 100 MG tablet Take 1 tablet (100 mg total) by mouth daily. 11/15/18   Darlin DropHall, Carole N,  DO    Family History Family History  Problem Relation Age of Onset  . Drug abuse Brother   . Alcohol abuse Father   . Drug abuse Mother     Social History Social History   Tobacco Use  . Smoking status: Current Every Day Smoker    Packs/day: 0.25    Years: 0.50    Pack years: 0.12    Types: Cigarettes  . Smokeless tobacco: Never Used  Substance Use Topics  . Alcohol use: Yes    Comment: states occasional binge drinking  . Drug use: No     Allergies   Adhesive [tape]; Naproxen; Ambien [zolpidem tartrate]; Zofran [ondansetron]; and Sulfa antibiotics   Review of Systems Review  of Systems  Constitutional: Positive for fatigue. Negative for chills and fever.  HENT: Negative for congestion and rhinorrhea.   Eyes: Negative for visual disturbance.  Respiratory: Negative for cough, shortness of breath and wheezing.   Cardiovascular: Negative for chest pain and leg swelling.  Gastrointestinal: Negative for abdominal pain, diarrhea, nausea and vomiting.  Genitourinary: Negative for dysuria and flank pain.  Musculoskeletal: Positive for arthralgias (Right upper extremity status post fracture). Negative for neck pain and neck stiffness.  Skin: Positive for rash. Negative for wound.  Allergic/Immunologic: Negative for immunocompromised state.  Neurological: Negative for syncope, weakness and headaches.  All other systems reviewed and are negative.    Physical Exam Updated Vital Signs BP 126/89 (BP Location: Right Arm)   Pulse (!) 105   Temp 97.9 F (36.6 C) (Oral)   Resp 20   Ht 5\' 4"  (1.626 m)   Wt 86.2 kg   SpO2 98%   BMI 32.61 kg/m   Physical Exam Vitals signs and nursing note reviewed.  Constitutional:      General: She is not in acute distress.    Appearance: She is well-developed.  HENT:     Head: Normocephalic and atraumatic.     Comments: No apparent lip or tongue swelling Eyes:     Conjunctiva/sclera: Conjunctivae normal.  Neck:     Musculoskeletal:  Neck supple.  Cardiovascular:     Rate and Rhythm: Normal rate and regular rhythm.     Heart sounds: Normal heart sounds.  Pulmonary:     Effort: Pulmonary effort is normal. No respiratory distress.     Breath sounds: No wheezing.     Comments: No wheezing, normal work of breathing Abdominal:     General: There is no distension.  Skin:    General: Skin is warm.     Capillary Refill: Capillary refill takes less than 2 seconds.     Findings: No rash.     Comments: Diffuse urticaria, noted primarily along the trunk, but also extends to the face and bilateral upper and lower extremities.  No open wounds  Neurological:     Mental Status: She is alert and oriented to person, place, and time.     Motor: No abnormal muscle tone.      ED Treatments / Results  Labs (all labs ordered are listed, but only abnormal results are displayed) Labs Reviewed - No data to display  EKG None  Radiology No results found.  Procedures Procedures (including critical care time)  Medications Ordered in ED Medications  hydrOXYzine (ATARAX/VISTARIL) tablet 50 mg (50 mg Oral Given 12/25/18 1032)  predniSONE (DELTASONE) tablet 60 mg (60 mg Oral Given 12/25/18 1031)  famotidine (PEPCID) tablet 40 mg (40 mg Oral Given 12/25/18 1032)  HYDROcodone-acetaminophen (NORCO/VICODIN) 5-325 MG per tablet 2 tablet (2 tablets Oral Given 12/25/18 1032)     Initial Impression / Assessment and Plan / ED Course  I have reviewed the triage vital signs and the nursing notes.  Pertinent labs & imaging results that were available during my care of the patient were reviewed by me and considered in my medical decision making (see chart for details).  Clinical Course as of Dec 25 1122  Sun Dec 25, 2018  1103 43 yo F here w/ diffuse urticaria. Suspect this is 2/2 naproxen/excedrin versus new body soap. No signs of oral mucosal involvement. No second system involvement or signs of anaphylaxis. VSS. Mild tachycardia is likely  2/2 irritation from itching. Rash is not c/w SJS/TEN,  RMSF, or other infectious etiology.    [CI]  1123 Sx markedly improved after meds. No wheezing or signs of second sytem involvement. D/c with outpt follow-up, prednisone, antihistamines, and epipen PRN.   [CI]    Clinical Course User Index [CI] Shaune Pollack, MD         Final Clinical Impressions(s) / ED Diagnoses   Final diagnoses:  Urticaria  Allergic reaction, initial encounter    ED Discharge Orders         Ordered    predniSONE (DELTASONE) 20 MG tablet  Daily     12/25/18 1121    hydrOXYzine (ATARAX/VISTARIL) 25 MG tablet  Every 8 hours PRN     12/25/18 1121    famotidine (PEPCID) 40 MG tablet  Daily     12/25/18 1121    EPINEPHrine 0.3 mg/0.3 mL IJ SOAJ injection  As needed     12/25/18 1121           Shaune Pollack, MD 12/25/18 1124

## 2019-11-01 ENCOUNTER — Other Ambulatory Visit: Payer: Self-pay

## 2019-11-01 ENCOUNTER — Encounter (HOSPITAL_BASED_OUTPATIENT_CLINIC_OR_DEPARTMENT_OTHER): Payer: Self-pay | Admitting: *Deleted

## 2019-11-01 ENCOUNTER — Emergency Department (HOSPITAL_BASED_OUTPATIENT_CLINIC_OR_DEPARTMENT_OTHER)
Admission: EM | Admit: 2019-11-01 | Discharge: 2019-11-01 | Disposition: A | Discharge status: Home / Self Care | Attending: Emergency Medicine | Admitting: Emergency Medicine

## 2019-11-01 DIAGNOSIS — R300 Dysuria: Secondary | ICD-10-CM | POA: Diagnosis present

## 2019-11-01 DIAGNOSIS — M545 Low back pain, unspecified: Secondary | ICD-10-CM

## 2019-11-01 DIAGNOSIS — F17210 Nicotine dependence, cigarettes, uncomplicated: Secondary | ICD-10-CM | POA: Diagnosis not present

## 2019-11-01 DIAGNOSIS — E039 Hypothyroidism, unspecified: Secondary | ICD-10-CM | POA: Insufficient documentation

## 2019-11-01 DIAGNOSIS — Z79899 Other long term (current) drug therapy: Secondary | ICD-10-CM | POA: Diagnosis not present

## 2019-11-01 LAB — URINALYSIS, ROUTINE W REFLEX MICROSCOPIC
Bilirubin Urine: NEGATIVE
Glucose, UA: NEGATIVE mg/dL
Hgb urine dipstick: NEGATIVE
Ketones, ur: NEGATIVE mg/dL
Leukocytes,Ua: NEGATIVE
Nitrite: NEGATIVE
Protein, ur: NEGATIVE mg/dL
Specific Gravity, Urine: 1.01 (ref 1.005–1.030)
pH: 6 (ref 5.0–8.0)

## 2019-11-01 MED ORDER — KETOROLAC TROMETHAMINE 60 MG/2ML IM SOLN
60.0000 mg | Freq: Once | INTRAMUSCULAR | Status: AC
  Administered 2019-11-01: 60 mg via INTRAMUSCULAR
  Filled 2019-11-01: qty 2

## 2019-11-01 MED ORDER — METHOCARBAMOL 500 MG PO TABS: 500.0000 mg | ORAL_TABLET | Freq: Three times a day (TID) | ORAL | 0 refills | Status: DC | PRN

## 2019-11-01 MED ORDER — IBUPROFEN 800 MG PO TABS: 800.0000 mg | ORAL_TABLET | Freq: Three times a day (TID) | ORAL | 0 refills | Status: DC | PRN

## 2019-11-01 NOTE — ED Provider Notes (Signed)
TIME SEEN: 2:24 AM  CHIEF COMPLAINT: Lower back pain  HPI: Patient is a 44 year old female who presents to the emergency department for concerns for UTI.  Reports several days of lower back pain, dark and foul-smelling urine.  Has had some dysuria.  No vaginal bleeding or discharge.  No abdominal pain.  No fevers, nausea, vomiting or diarrhea.  No injury to the back.  No numbness, tingling or focal weakness.  No bowel or bladder incontinence.  ROS: See HPI Constitutional: no fever  Eyes: no drainage  ENT: no runny nose   Cardiovascular:  no chest pain  Resp: no SOB  GI: no vomiting GU:  dysuria Integumentary: no rash  Allergy: no hives  Musculoskeletal: no leg swelling  Neurological: no slurred speech ROS otherwise negative  PAST MEDICAL HISTORY/PAST SURGICAL HISTORY:  Past Medical History:  Diagnosis Date  . Alcohol abuse   . Anxiety   . Bimalleolar fracture of left ankle 04/2015  . Complication of anesthesia    hypotension with breast augmentation  . Depression   . GERD (gastroesophageal reflux disease)   . Headache    "once/week" (03/20/2016)  . Hypothyroidism 2010-2012   "stopped RX in 2012 when I didn't meet standards" (03/20/2016)  . Iron deficiency anemia   . Migraines    "once/month" (03/20/2016)  . Pneumonia "several times"  . Sciatic pain 04/12/2015   left    MEDICATIONS:  Prior to Admission medications   Medication Sig Start Date End Date Taking? Authorizing Provider  cloNIDine (CATAPRES) 0.1 MG tablet Take 1 tablet (0.1 mg total) by mouth 2 (two) times daily. 11/15/18   Darlin Drop, DO  cyclobenzaprine (FLEXERIL) 5 MG tablet Take 1 tablet (5 mg total) by mouth daily as needed for muscle spasms. 11/15/18   Darlin Drop, DO  DULoxetine (CYMBALTA) 30 MG capsule Take 1 capsule (30 mg total) by mouth daily. 11/15/18   Darlin Drop, DO  EPINEPHrine 0.3 mg/0.3 mL IJ SOAJ injection Inject 0.3 mLs (0.3 mg total) into the muscle as needed for anaphylaxis. 12/25/18    Shaune Pollack, MD  famotidine (PEPCID) 40 MG tablet Take 1 tablet (40 mg total) by mouth daily for 5 days. 12/25/18 12/30/18  Shaune Pollack, MD  folic acid (FOLVITE) 1 MG tablet Take 1 tablet (1 mg total) by mouth daily. 11/15/18   Darlin Drop, DO  gabapentin (NEURONTIN) 300 MG capsule Take 1 capsule (300 mg total) by mouth 3 (three) times daily. 11/15/18   Darlin Drop, DO  hydrOXYzine (ATARAX/VISTARIL) 25 MG tablet Take 1-2 tablets (25-50 mg total) by mouth every 8 (eight) hours as needed for itching. 12/25/18   Shaune Pollack, MD  ibuprofen (ADVIL,MOTRIN) 200 MG tablet Take 800 mg by mouth every 6 (six) hours as needed for headache or moderate pain.    [provider]  levothyroxine (SYNTHROID, LEVOTHROID) 50 MCG tablet Take 1 tablet (50 mcg total) by mouth daily before breakfast. 11/15/18   Darlin Drop, DO  mirtazapine (REMERON) 15 MG tablet Take 1 tablet (15 mg total) by mouth at bedtime. 11/15/18   Darlin Drop, DO  Multiple Vitamin (MULTIVITAMIN WITH MINERALS) TABS tablet Take 1 tablet by mouth daily. 11/15/18   Darlin Drop, DO  nicotine (NICODERM CQ - DOSED IN MG/24 HOURS) 21 mg/24hr patch Place 1 patch (21 mg total) onto the skin daily. 11/15/18   Darlin Drop, DO  pantoprazole (PROTONIX) 40 MG tablet Take 1 tablet (40 mg total) by mouth  daily. 11/15/18   Kayleen Memos, DO  thiamine 100 MG tablet Take 1 tablet (100 mg total) by mouth daily. 11/15/18   Kayleen Memos, DO    ALLERGIES:  Allergies  Allergen Reactions  . Adhesive [Tape] Other (See Comments)    TEARS SKIN  . Naproxen Hives  . Ambien [Zolpidem Tartrate] Other (See Comments)    Delirium, disorientation  . Zofran [Ondansetron]     "doesn't work"  . Sulfa Antibiotics Itching    SOCIAL HISTORY:  Social History   Tobacco Use  . Smoking status: Current Every Day Smoker    Packs/day: 0.25    Years: 0.50    Pack years: 0.12    Types: Cigarettes  . Smokeless tobacco: Never Used  Substance Use Topics   . Alcohol use: Yes    Comment: states occasional binge drinking    FAMILY HISTORY: Family History  Problem Relation Age of Onset  . Drug abuse Brother   . Alcohol abuse Father   . Drug abuse Mother     EXAM: BP (!) 145/96 (BP Location: Left Arm)   Pulse 95   Temp 98.2 F (36.8 C) (Oral)   Resp 18   Ht 5\' 4"  (1.626 m)   Wt 79.8 kg   SpO2 100%   BMI 30.21 kg/m  CONSTITUTIONAL: Alert and oriented and responds appropriately to questions. Well-appearing; well-nourished, in no distress, afebrile, nontoxic, well-hydrated HEAD: Normocephalic EYES: Conjunctivae clear, pupils appear equal, EOM appear intact ENT: normal nose; moist mucous membranes NECK: Supple, normal ROM CARD: RRR; S1 and S2 appreciated; no murmurs, no clicks, no rubs, no gallops RESP: Normal chest excursion without splinting or tachypnea; breath sounds clear and equal bilaterally; no wheezes, no rhonchi, no rales, no hypoxia or respiratory distress, speaking full sentences ABD/GI: Normal bowel sounds; non-distended; soft, non-tender, no rebound, no guarding, no peritoneal signs, no hepatosplenomegaly BACK:  The back appears normal, tender to palpation over the lower lumbar paraspinal muscles bilaterally, no midline spinal tenderness or step-off or deformity, no redness or warmth, no ecchymosis or swelling, no CVA tenderness EXT: Normal ROM in all joints; no deformity noted, no edema; no cyanosis SKIN: Normal color for age and race; warm; no rash on exposed skin NEURO: Moves all extremities equally, normal sensation diffusely, no saddle anesthesia, normal gait PSYCH: The patient's mood and manner are appropriate.   MEDICAL DECISION MAKING: Patient here with lower back pain.  No CVA tenderness.  Urine shows no sign of blood or infection.  Patient denies any lower abdominal pain, vaginal bleeding or discharge.  This could be musculoskeletal pain.  Doubt cauda equina, epidural abscess, hematoma, discitis or osteomyelitis,  transverse myelitis, fracture.  She is requesting an injection of Toradol for discomfort.  No history of renal issues.  Will discharge with ibuprofen, Robaxin, instructions to alternate heat and ice, stretching.  Discussed return precautions.  I do not feel further emergent work-up is needed at this time.   At this time, I do not feel there is any life-threatening condition present. I have reviewed, interpreted and discussed all results (EKG, imaging, lab, urine as appropriate) and exam findings with patient/family. I have reviewed nursing notes and appropriate previous records.  I feel the patient is safe to be discharged home without further emergent workup and can continue workup as an outpatient as needed. Discussed usual and customary return precautions. Patient/family verbalize understanding and are comfortable with this plan.  Outpatient follow-up has been provided as needed. All questions have been  answered.     Julie Conley was evaluated in Emergency Department on 11/01/2019 for the symptoms described in the history of present illness. She was evaluated in the context of the global COVID-19 pandemic, which necessitated consideration that the patient might be at risk for infection with the SARS-CoV-2 virus that causes COVID-19. Institutional protocols and algorithms that pertain to the evaluation of patients at risk for COVID-19 are in a state of rapid change based on information released by regulatory bodies including the CDC and federal and state organizations. These policies and algorithms were followed during the patient's care in the ED.  Patient was seen wearing N95, face shield, gloves.    Julie Conley, Layla Maw, DO 11/01/19 [id number]

## 2019-11-01 NOTE — ED Triage Notes (Signed)
Bilateral flank pain without dysuria. Reports dark and malodorous urine. Hx of UTI

## 2019-11-02 LAB — URINE CULTURE: Culture: NO GROWTH

## 2020-09-19 ENCOUNTER — Encounter (HOSPITAL_BASED_OUTPATIENT_CLINIC_OR_DEPARTMENT_OTHER): Payer: Self-pay

## 2020-09-19 ENCOUNTER — Emergency Department (HOSPITAL_BASED_OUTPATIENT_CLINIC_OR_DEPARTMENT_OTHER)
Admission: EM | Admit: 2020-09-19 | Discharge: 2020-09-19 | Disposition: A | Discharge status: Home / Self Care | Attending: Emergency Medicine | Admitting: Emergency Medicine

## 2020-09-19 ENCOUNTER — Other Ambulatory Visit: Payer: Self-pay

## 2020-09-19 DIAGNOSIS — Z4802 Encounter for removal of sutures: Secondary | ICD-10-CM | POA: Insufficient documentation

## 2020-09-19 DIAGNOSIS — Z5321 Procedure and treatment not carried out due to patient leaving prior to being seen by health care provider: Secondary | ICD-10-CM | POA: Insufficient documentation

## 2020-09-19 NOTE — ED Triage Notes (Signed)
Pt requesting suture removal left UE-states were placed 1/1 at [facility] ED-NAD-steady gait

## 2021-01-01 ENCOUNTER — Other Ambulatory Visit: Payer: Self-pay

## 2021-01-01 ENCOUNTER — Ambulatory Visit (HOSPITAL_COMMUNITY): Admitting: Clinical

## 2022-10-01 ENCOUNTER — Emergency Department (HOSPITAL_BASED_OUTPATIENT_CLINIC_OR_DEPARTMENT_OTHER)
Admission: EM | Admit: 2022-10-01 | Discharge: 2022-10-01 | Disposition: A | Discharge status: Home / Self Care | Attending: Emergency Medicine | Admitting: Emergency Medicine

## 2022-10-01 ENCOUNTER — Other Ambulatory Visit: Payer: Self-pay

## 2022-10-01 ENCOUNTER — Encounter (HOSPITAL_BASED_OUTPATIENT_CLINIC_OR_DEPARTMENT_OTHER): Payer: Self-pay | Admitting: Emergency Medicine

## 2022-10-01 ENCOUNTER — Emergency Department (HOSPITAL_BASED_OUTPATIENT_CLINIC_OR_DEPARTMENT_OTHER)

## 2022-10-01 ENCOUNTER — Emergency Department (HOSPITAL_BASED_OUTPATIENT_CLINIC_OR_DEPARTMENT_OTHER): Admit: 2022-10-01

## 2022-10-01 DIAGNOSIS — S92192A Other fracture of left talus, initial encounter for closed fracture: Secondary | ICD-10-CM

## 2022-10-01 DIAGNOSIS — R600 Localized edema: Secondary | ICD-10-CM

## 2022-10-01 DIAGNOSIS — X509XXA Other and unspecified overexertion or strenuous movements or postures, initial encounter: Secondary | ICD-10-CM | POA: Insufficient documentation

## 2022-10-01 DIAGNOSIS — F17200 Nicotine dependence, unspecified, uncomplicated: Secondary | ICD-10-CM | POA: Insufficient documentation

## 2022-10-01 DIAGNOSIS — S92102A Unspecified fracture of left talus, initial encounter for closed fracture: Secondary | ICD-10-CM | POA: Insufficient documentation

## 2022-10-01 DIAGNOSIS — S8992XA Unspecified injury of left lower leg, initial encounter: Secondary | ICD-10-CM | POA: Diagnosis present

## 2022-10-01 NOTE — Discharge Instructions (Signed)
You have a break in your foot that may be contributing to your swelling.  Do not put any weight on your foot and call the orthopedic surgeon for follow-up.  Please return later today to get your ultrasound to make sure there is no blood clot in your leg.  Get rechecked sooner if you have new or concerning symptoms.

## 2022-10-01 NOTE — ED Provider Notes (Signed)
[facility] Provider Note   CSN: [id number] Arrival date & time: 10/01/22  0113     History  Chief Complaint  Patient presents with   Leg Swelling    Julie Conley is a 47 y.o. female.  The history is provided by the patient.   Julie Conley is a 47 y.o. female who presents to the Emergency Department complaining of leg swelling.  She presents to the emergency department for evaluation of swelling and pain to her left foot and ankle that started yesterday.  She also reports some mild associated cramping to her left calf as well as nausea.  No fever, chest pain, cough, shortness of breath, abdominal pain.  No recent injuries.  She does have a remote history of ORIF of the left ankle in 2016.  She also twisted her ankle about a month ago.  She does not have any history of DVT/PE.  No history of diabetes.  She does have a family history of diabetes.  She does not use any hormone medications.  She does use tobacco.    Home Medications Prior to Admission medications   Medication Sig Start Date End Date Taking? Authorizing Provider  cloNIDine (CATAPRES) 0.1 MG tablet Take 1 tablet (0.1 mg total) by mouth 2 (two) times daily. 11/15/18   Darlin Drop, DO  cyclobenzaprine (FLEXERIL) 5 MG tablet Take 1 tablet (5 mg total) by mouth daily as needed for muscle spasms. 11/15/18   Darlin Drop, DO  DULoxetine (CYMBALTA) 30 MG capsule Take 1 capsule (30 mg total) by mouth daily. 11/15/18   Darlin Drop, DO  EPINEPHrine 0.3 mg/0.3 mL IJ SOAJ injection Inject 0.3 mLs (0.3 mg total) into the muscle as needed for anaphylaxis. 12/25/18   Shaune Pollack, MD  famotidine (PEPCID) 40 MG tablet Take 1 tablet (40 mg total) by mouth daily for 5 days. 12/25/18 12/30/18  Shaune Pollack, MD  folic acid (FOLVITE) 1 MG tablet Take 1 tablet (1 mg total) by mouth daily. 11/15/18   Darlin Drop, DO  gabapentin (NEURONTIN) 300 MG capsule Take 1 capsule (300 mg total)  by mouth 3 (three) times daily. 11/15/18   Darlin Drop, DO  hydrOXYzine (ATARAX/VISTARIL) 25 MG tablet Take 1-2 tablets (25-50 mg total) by mouth every 8 (eight) hours as needed for itching. 12/25/18   Shaune Pollack, MD  ibuprofen (ADVIL) 800 MG tablet Take 1 tablet (800 mg total) by mouth every 8 (eight) hours as needed for mild pain. 11/01/19   Ward, Layla Maw, DO  levothyroxine (SYNTHROID, LEVOTHROID) 50 MCG tablet Take 1 tablet (50 mcg total) by mouth daily before breakfast. 11/15/18   Darlin Drop, DO  methocarbamol (ROBAXIN) 500 MG tablet Take 1 tablet (500 mg total) by mouth every 8 (eight) hours as needed for muscle spasms. 11/01/19   Ward, Layla Maw, DO  mirtazapine (REMERON) 15 MG tablet Take 1 tablet (15 mg total) by mouth at bedtime. 11/15/18   Darlin Drop, DO  Multiple Vitamin (MULTIVITAMIN WITH MINERALS) TABS tablet Take 1 tablet by mouth daily. 11/15/18   Darlin Drop, DO  nicotine (NICODERM CQ - DOSED IN MG/24 HOURS) 21 mg/24hr patch Place 1 patch (21 mg total) onto the skin daily. 11/15/18   Darlin Drop, DO  pantoprazole (PROTONIX) 40 MG tablet Take 1 tablet (40 mg total) by mouth daily. 11/15/18   Darlin Drop, DO  thiamine 100 MG tablet Take 1 tablet (100 mg  total) by mouth daily. 11/15/18   Kayleen Memos, DO      Allergies    Adhesive [tape], Naproxen, Ambien [zolpidem tartrate], Zofran [ondansetron], and Sulfa antibiotics    Review of Systems   Review of Systems  All other systems reviewed and are negative.   Physical Exam Updated Vital Signs BP 118/80 (BP Location: Right Arm)   Pulse 85   Temp 98.7 F (37.1 C) (Oral)   Resp 20   Ht 5\' 4"  (1.626 m)   Wt 90.7 kg   SpO2 100%   BMI 34.33 kg/m  Physical Exam Vitals and nursing note reviewed.  Constitutional:      Appearance: She is well-developed.  HENT:     Head: Normocephalic and atraumatic.  Cardiovascular:     Rate and Rhythm: Normal rate and regular rhythm.  Pulmonary:     Effort: Pulmonary  effort is normal.     Breath sounds: Normal breath sounds.  Musculoskeletal:     Comments: 2+ DP pulses bilaterally.  There is soft tissue swelling to the left foot and ankle and mild fullness to the left calf.  There is minimal erythema to the foot and ankle.  No significant calf tenderness to palpation.  Minimal tenderness over the left foot and ankle.  Skin:    General: Skin is warm and dry.  Neurological:     Mental Status: She is alert and oriented to person, place, and time.  Psychiatric:        Behavior: Behavior normal.     ED Results / Procedures / Treatments   Labs (all labs ordered are listed, but only abnormal results are displayed) Labs Reviewed - No data to display  EKG None  Radiology DG Ankle Complete Left  Result Date: 10/01/2022 CLINICAL DATA:  Left foot and leg swelling EXAM: LEFT ANKLE COMPLETE - 3+ VIEW COMPARISON:  11/19/2021 FINDINGS: Left ankle ORIF with medial malleolar lag screws and type rope repair of the distal tibia fibular syndesmosis noted. Normal alignment. There is irregularity of the dorsal aspect of the anterior process of the talus which is new from prior examination and is suggestive of an impaction fracture, likely acute to subacute in nature. No ankle effusion. There is diffuse subcutaneous edema with marked bimalleolar soft tissue swelling noted. There is irregularity involving the dorsal, anterior process of the talus which may reflect the sequela of IMPRESSION: 1. Diffuse subcutaneous edema with marked bimalleolar soft tissue swelling. 2. Suspected acute to subacute impaction fracture of the dorsal aspect of the anterior process of the talus. Correlation for point tenderness would be helpful in confirming this. 3. Status post left ankle ORIF. Electronically Signed   By: Fidela Salisbury M.D.   On: 10/01/2022 03:02    Procedures Procedures    Medications Ordered in ED Medications - No data to display  ED Course/ Medical Decision Making/ A&P                              Medical Decision Making Amount and/or Complexity of Data Reviewed Radiology: ordered.   Patient here for evaluation of left ankle swelling and pain.  She has a remote history of ORIF of the ankle in 2016.  She did have an injury about 1 month ago.  She does now have increased swelling and pain to the ankle.  Current clinical picture is not consistent with acute ischemia, acute infection or gout.  Images are concerning for  acute to subacute talar fracture-images personally reviewed and interpreted, agree with radiologist interpretation.  She did have an injury about 1 month ago and states that she has been standing more on it for work, suspect that some of her swelling is reactive due to increased mobility and recent injury.  Will place in a boot with nonweightbearing with orthopedics follow-up.  She will need an ultrasound to rule out DVT-ultrasound is not available at this time, patient will return later today for formal ultrasound.        Final Clinical Impression(s) / ED Diagnoses Final diagnoses:  Leg edema  Other closed fracture of left talus, initial encounter    Rx / DC Orders ED Discharge Orders          Ordered    Lower Ext Left Venous US       Comments: IMPORTANT PATIENT INSTRUCTIONS:  You have been scheduled for an Outpatient Ultrasound.    Your appointment has been scheduled for:  _______ am/pm on _______________ (date).  If your appointment is scheduled for a Saturday, Sunday or holiday, please go to the [facility] Emergency Department Registration Desk at least 15 minutes prior to your appointment time and tell them you are there for an ultrasound.    If your appointment is scheduled for a weekday (Monday - Friday), please go directly to the [facility] Radiology Department reception area at least 15 minutes prior to your appointment time and tell them you are there for an ultrasound.  Please call [phone] with  questions.   10/01/22 [id number]              Quintella Reichert, MD 10/01/22 [phone]

## 2022-10-01 NOTE — ED Triage Notes (Signed)
Left foot/lower leg swelling, has Hx of broken ankle with hardware. X 1 day

## 2022-10-09 ENCOUNTER — Telehealth (HOSPITAL_BASED_OUTPATIENT_CLINIC_OR_DEPARTMENT_OTHER): Payer: Self-pay

## 2023-08-08 ENCOUNTER — Emergency Department (HOSPITAL_COMMUNITY)

## 2023-08-08 ENCOUNTER — Emergency Department (HOSPITAL_COMMUNITY)
Admission: EM | Admit: 2023-08-08 | Discharge: 2023-08-09 | Disposition: A | Discharge status: Home / Self Care | Attending: Emergency Medicine | Admitting: Emergency Medicine

## 2023-08-08 DIAGNOSIS — M79602 Pain in left arm: Secondary | ICD-10-CM | POA: Diagnosis present

## 2023-08-08 DIAGNOSIS — Y92410 Unspecified street and highway as the place of occurrence of the external cause: Secondary | ICD-10-CM | POA: Diagnosis not present

## 2023-08-08 DIAGNOSIS — F332 Major depressive disorder, recurrent severe without psychotic features: Secondary | ICD-10-CM

## 2023-08-08 DIAGNOSIS — I959 Hypotension, unspecified: Secondary | ICD-10-CM | POA: Insufficient documentation

## 2023-08-08 DIAGNOSIS — F10229 Alcohol dependence with intoxication, unspecified: Secondary | ICD-10-CM

## 2023-08-08 DIAGNOSIS — T1491XA Suicide attempt, initial encounter: Secondary | ICD-10-CM

## 2023-08-08 DIAGNOSIS — F4321 Adjustment disorder with depressed mood: Secondary | ICD-10-CM

## 2023-08-08 DIAGNOSIS — Z91040 Latex allergy status: Secondary | ICD-10-CM | POA: Insufficient documentation

## 2023-08-08 DIAGNOSIS — F10129 Alcohol abuse with intoxication, unspecified: Secondary | ICD-10-CM

## 2023-08-08 MED ORDER — OXYCODONE HCL 5 MG PO TABS
5.0000 mg | ORAL_TABLET | Freq: Four times a day (QID) | ORAL | Status: DC | PRN
  Administered 2023-08-08 – 2023-08-09 (×3): 5 mg via ORAL
  Filled 2023-08-08 (×4): qty 1

## 2023-08-08 MED ORDER — TETANUS-DIPHTH-ACELL PERTUSSIS 5-2.5-18.5 LF-MCG/0.5 IM SUSY
0.5000 mL | PREFILLED_SYRINGE | Freq: Once | INTRAMUSCULAR | Status: DC
  Filled 2023-08-08: qty 0.5

## 2023-08-09 ENCOUNTER — Other Ambulatory Visit: Payer: Self-pay

## 2023-08-09 DIAGNOSIS — F10129 Alcohol abuse with intoxication, unspecified: Secondary | ICD-10-CM | POA: Diagnosis not present

## 2023-08-09 DIAGNOSIS — F10229 Alcohol dependence with intoxication, unspecified: Secondary | ICD-10-CM

## 2023-08-09 DIAGNOSIS — M79602 Pain in left arm: Secondary | ICD-10-CM | POA: Diagnosis not present

## 2023-08-09 DIAGNOSIS — F332 Major depressive disorder, recurrent severe without psychotic features: Secondary | ICD-10-CM | POA: Diagnosis not present

## 2023-08-09 DIAGNOSIS — T1491XA Suicide attempt, initial encounter: Secondary | ICD-10-CM

## 2023-08-09 DIAGNOSIS — F4321 Adjustment disorder with depressed mood: Secondary | ICD-10-CM

## 2023-08-09 MED ORDER — ACETAMINOPHEN 500 MG PO TABS: 1000.0000 mg | ORAL_TABLET | Freq: Three times a day (TID) | ORAL | Status: DC

## 2023-08-09 MED ORDER — IBUPROFEN 400 MG PO TABS
400.0000 mg | ORAL_TABLET | Freq: Four times a day (QID) | ORAL | Status: DC | PRN
  Administered 2023-08-09: 400 mg via ORAL
  Filled 2023-08-09: qty 1

## 2023-08-09 MED ORDER — QUETIAPINE FUMARATE ER 300 MG PO TB24
300.0000 mg | ORAL_TABLET | Freq: Every day | ORAL | Status: DC
  Filled 2023-08-09: qty 1

## 2023-08-09 MED ORDER — VENLAFAXINE HCL ER 75 MG PO CP24
150.0000 mg | ORAL_CAPSULE | Freq: Every day | ORAL | Status: DC
  Administered 2023-08-09: 150 mg via ORAL
  Filled 2023-08-09: qty 2

## 2023-08-09 MED ORDER — CLONAZEPAM 0.5 MG PO TABS
0.5000 mg | ORAL_TABLET | Freq: Two times a day (BID) | ORAL | Status: DC | PRN
  Administered 2023-08-09: 0.5 mg via ORAL
  Filled 2023-08-09: qty 1

## 2023-08-09 MED ORDER — NALTREXONE HCL 50 MG PO TABS
50.0000 mg | ORAL_TABLET | Freq: Every day | ORAL | Status: DC
  Administered 2023-08-09: 50 mg via ORAL
  Filled 2023-08-09 (×2): qty 1
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
## 2024-02-18 ENCOUNTER — Other Ambulatory Visit: Payer: Self-pay

## 2024-02-18 ENCOUNTER — Emergency Department (HOSPITAL_COMMUNITY)
Admission: EM | Admit: 2024-02-18 | Discharge: 2024-02-18 | Disposition: A | Discharge status: Other Acute Inpatient Hospital | Attending: Emergency Medicine | Admitting: Emergency Medicine

## 2024-02-18 ENCOUNTER — Inpatient Hospital Stay (HOSPITAL_COMMUNITY)
Admission: AD | Admit: 2024-02-18 | Discharge: 2024-02-22 | DRG: 897 | Disposition: A | Discharge status: Home / Self Care | Source: Intra-hospital | Attending: Psychiatry | Admitting: Psychiatry

## 2024-02-18 ENCOUNTER — Emergency Department (HOSPITAL_COMMUNITY)

## 2024-02-18 ENCOUNTER — Encounter (HOSPITAL_COMMUNITY): Payer: Self-pay

## 2024-02-18 DIAGNOSIS — Z62810 Personal history of physical and sexual abuse in childhood: Secondary | ICD-10-CM | POA: Diagnosis not present

## 2024-02-18 DIAGNOSIS — F10229 Alcohol dependence with intoxication, unspecified: Principal | ICD-10-CM | POA: Diagnosis present

## 2024-02-18 DIAGNOSIS — E039 Hypothyroidism, unspecified: Secondary | ICD-10-CM | POA: Diagnosis not present

## 2024-02-18 DIAGNOSIS — W1809XA Striking against other object with subsequent fall, initial encounter: Secondary | ICD-10-CM | POA: Diagnosis not present

## 2024-02-18 DIAGNOSIS — Z818 Family history of other mental and behavioral disorders: Secondary | ICD-10-CM | POA: Diagnosis not present

## 2024-02-18 DIAGNOSIS — Z91040 Latex allergy status: Secondary | ICD-10-CM | POA: Diagnosis not present

## 2024-02-18 DIAGNOSIS — Z813 Family history of other psychoactive substance abuse and dependence: Secondary | ICD-10-CM | POA: Diagnosis not present

## 2024-02-18 DIAGNOSIS — F10129 Alcohol abuse with intoxication, unspecified: Secondary | ICD-10-CM | POA: Diagnosis present

## 2024-02-18 DIAGNOSIS — F314 Bipolar disorder, current episode depressed, severe, without psychotic features: Secondary | ICD-10-CM | POA: Diagnosis present

## 2024-02-18 DIAGNOSIS — Z90710 Acquired absence of both cervix and uterus: Secondary | ICD-10-CM | POA: Diagnosis not present

## 2024-02-18 DIAGNOSIS — R45851 Suicidal ideations: Secondary | ICD-10-CM | POA: Diagnosis present

## 2024-02-18 DIAGNOSIS — Y908 Blood alcohol level of 240 mg/100 ml or more: Secondary | ICD-10-CM | POA: Diagnosis present

## 2024-02-18 DIAGNOSIS — F10920 Alcohol use, unspecified with intoxication, uncomplicated: Secondary | ICD-10-CM | POA: Insufficient documentation

## 2024-02-18 DIAGNOSIS — F4310 Post-traumatic stress disorder, unspecified: Secondary | ICD-10-CM | POA: Diagnosis present

## 2024-02-18 DIAGNOSIS — F4321 Adjustment disorder with depressed mood: Secondary | ICD-10-CM | POA: Diagnosis present

## 2024-02-18 DIAGNOSIS — Z9884 Bariatric surgery status: Secondary | ICD-10-CM

## 2024-02-18 DIAGNOSIS — F4381 Prolonged grief disorder: Secondary | ICD-10-CM | POA: Insufficient documentation

## 2024-02-18 DIAGNOSIS — W19XXXA Unspecified fall, initial encounter: Secondary | ICD-10-CM

## 2024-02-18 DIAGNOSIS — Z7985 Long-term (current) use of injectable non-insulin antidiabetic drugs: Secondary | ICD-10-CM

## 2024-02-18 DIAGNOSIS — F419 Anxiety disorder, unspecified: Secondary | ICD-10-CM | POA: Diagnosis present

## 2024-02-18 DIAGNOSIS — Z9151 Personal history of suicidal behavior: Secondary | ICD-10-CM

## 2024-02-18 DIAGNOSIS — S0081XA Abrasion of other part of head, initial encounter: Secondary | ICD-10-CM | POA: Diagnosis not present

## 2024-02-18 DIAGNOSIS — F17210 Nicotine dependence, cigarettes, uncomplicated: Secondary | ICD-10-CM | POA: Diagnosis present

## 2024-02-18 DIAGNOSIS — F332 Major depressive disorder, recurrent severe without psychotic features: Secondary | ICD-10-CM | POA: Diagnosis present

## 2024-02-18 DIAGNOSIS — Z79899 Other long term (current) drug therapy: Secondary | ICD-10-CM | POA: Diagnosis not present

## 2024-02-18 DIAGNOSIS — F1020 Alcohol dependence, uncomplicated: Secondary | ICD-10-CM | POA: Diagnosis not present

## 2024-02-18 LAB — CBC
HCT: 34 % — ABNORMAL LOW (ref 36.0–46.0)
Hemoglobin: 11.2 g/dL — ABNORMAL LOW (ref 12.0–15.0)
MCH: 29 pg (ref 26.0–34.0)
MCHC: 32.9 g/dL (ref 30.0–36.0)
MCV: 88.1 fL (ref 80.0–100.0)
Platelets: 147 10*3/uL — ABNORMAL LOW (ref 150–400)
RBC: 3.86 MIL/uL — ABNORMAL LOW (ref 3.87–5.11)
RDW: 15.4 % (ref 11.5–15.5)
WBC: 5.5 10*3/uL (ref 4.0–10.5)
nRBC: 0 % (ref 0.0–0.2)

## 2024-02-18 LAB — COMPREHENSIVE METABOLIC PANEL WITH GFR
ALT: 22 U/L (ref 0–44)
AST: 34 U/L (ref 15–41)
Albumin: 3.7 g/dL (ref 3.5–5.0)
Alkaline Phosphatase: 101 U/L (ref 38–126)
Anion gap: 17 — ABNORMAL HIGH (ref 5–15)
BUN: 7 mg/dL (ref 6–20)
CO2: 18 mmol/L — ABNORMAL LOW (ref 22–32)
Calcium: 8.7 mg/dL — ABNORMAL LOW (ref 8.9–10.3)
Chloride: 100 mmol/L (ref 98–111)
Creatinine, Ser: 0.59 mg/dL (ref 0.44–1.00)
GFR, Estimated: >60 mL/min (ref >60)
Glucose, Bld: 107 mg/dL — ABNORMAL HIGH (ref 70–99)
Potassium: 3.7 mmol/L (ref 3.5–5.1)
Sodium: 135 mmol/L (ref 135–145)
Total Bilirubin: 0.4 mg/dL (ref 0.0–1.2)
Total Protein: 6.6 g/dL (ref 6.5–8.1)

## 2024-02-18 LAB — RAPID URINE DRUG SCREEN, HOSP PERFORMED
Amphetamines: NOT DETECTED
Barbiturates: NOT DETECTED
Benzodiazepines: NOT DETECTED
Cocaine: NOT DETECTED
Opiates: NOT DETECTED
Tetrahydrocannabinol: NOT DETECTED

## 2024-02-18 LAB — ETHANOL: Alcohol, Ethyl (B): 247 mg/dL — ABNORMAL HIGH (ref <15)

## 2024-02-18 MED ORDER — THIAMINE HCL 100 MG/ML IJ SOLN
100.0000 mg | Freq: Once | INTRAMUSCULAR | Status: AC
  Administered 2024-02-18: 100 mg via INTRAMUSCULAR
  Filled 2024-02-18: qty 2

## 2024-02-18 MED ORDER — QUETIAPINE FUMARATE 200 MG PO TABS
300.0000 mg | ORAL_TABLET | Freq: Every day | ORAL | Status: DC
Start: 2024-02-18 — End: 2024-02-18
  Administered 2024-02-18: 300 mg via ORAL
  Filled 2024-02-18: qty 1

## 2024-02-18 MED ORDER — LORAZEPAM 1 MG PO TABS: 1.0000 mg | ORAL_TABLET | Freq: Every day | ORAL | Status: DC

## 2024-02-18 MED ORDER — LORAZEPAM 1 MG PO TABS: 1.0000 mg | ORAL_TABLET | Freq: Two times a day (BID) | ORAL | Status: DC

## 2024-02-18 MED ORDER — LORAZEPAM 1 MG PO TABS: 1.0000 mg | ORAL_TABLET | Freq: Four times a day (QID) | ORAL | Status: DC

## 2024-02-18 MED ORDER — LORAZEPAM 1 MG PO TABS: 1.0000 mg | ORAL_TABLET | Freq: Three times a day (TID) | ORAL | Status: DC

## 2024-02-18 MED ORDER — CLONAZEPAM 0.5 MG PO TABS
1.0000 mg | ORAL_TABLET | Freq: Two times a day (BID) | ORAL | Status: DC
  Administered 2024-02-18: 1 mg via ORAL
  Filled 2024-02-18: qty 2

## 2024-02-18 MED ORDER — HYDROXYZINE HCL 25 MG PO TABS: 25.0000 mg | ORAL_TABLET | Freq: Four times a day (QID) | ORAL | Status: DC | PRN

## 2024-02-18 MED ORDER — TRAZODONE HCL 50 MG PO TABS
50.0000 mg | ORAL_TABLET | Freq: Every day | ORAL | Status: DC
Start: 2024-02-18 — End: 2024-02-18
  Administered 2024-02-18: 50 mg via ORAL
  Filled 2024-02-18: qty 1

## 2024-02-18 MED ORDER — VENLAFAXINE HCL ER 150 MG PO CP24: 150.0000 mg | ORAL_CAPSULE | Freq: Every day | ORAL | Status: DC

## 2024-02-18 MED ORDER — THIAMINE MONONITRATE 100 MG PO TABS: 100.0000 mg | ORAL_TABLET | Freq: Every day | ORAL | Status: DC

## 2024-02-18 MED ORDER — ADULT MULTIVITAMIN W/MINERALS CH
1.0000 | ORAL_TABLET | Freq: Every day | ORAL | Status: DC
  Administered 2024-02-18: 1 via ORAL
  Filled 2024-02-18: qty 1

## 2024-02-18 MED ORDER — AMPHETAMINE-DEXTROAMPHETAMINE 10 MG PO TABS: 20.0000 mg | ORAL_TABLET | Freq: Every day | ORAL | Status: DC

## 2024-02-18 MED ORDER — LOPERAMIDE HCL 2 MG PO CAPS: 2.0000 mg | ORAL_CAPSULE | ORAL | Status: DC | PRN

## 2024-02-18 MED ORDER — CLONAZEPAM 1 MG PO TBDP: 1.0000 mg | ORAL_TABLET | Freq: Two times a day (BID) | ORAL | Status: DC

## 2024-02-18 MED ORDER — LORAZEPAM 1 MG PO TABS
1.0000 mg | ORAL_TABLET | Freq: Four times a day (QID) | ORAL | Status: DC | PRN
  Administered 2024-02-18: 1 mg via ORAL
  Filled 2024-02-18: qty 1

## 2024-02-18 NOTE — ED Provider Notes (Signed)
  Assume Care - Medical Decision Making  Care of patient assumed from previous emergency medicine provider. See their note for further details of history, physical exam and plan.  Briefly, Julie Conley is a 48 y.o. female who presents with: Suicidal, Fall, and Alcohol Intoxication, hx of similar -medically cleared  Plan at time of Handoff: -f/u TTS consult   Vitals:   02/18/24 1020 02/18/24 1043  BP:  100/66  Pulse:  (!) 116  Resp:  18  Temp: 97.7 F (36.5 C)   SpO2:  96%   Physical Exam  BP (!) 135/91   Pulse 87   Temp 98.1 F (36.7 C) (Oral)   Resp 16   SpO2 100%     Additional MDM/ED Course: Patient will be admitted to psychiatric service for further evaluation and treatment.  CIWA protocol in place.    Angele Keller, DO PGY-3 Emergency Medicine    Angele Keller, DO 02/18/24 2331    Ninetta Basket, MD 02/22/24 [phone]

## 2024-02-18 NOTE — ED Notes (Signed)
 Sister called regarding update for sister. Family informed pt is still here. This RN to ask pt if they wish to call family during med pass.

## 2024-02-18 NOTE — ED Notes (Addendum)
 Julie Conley

## 2024-02-18 NOTE — Progress Notes (Signed)
 Pt has been accepted to [facility] on 02/18/2024  Bed assignment:305-01   Pt meets inpatient criteria per Volanda Gruber, NP  Attending Physician will be Dr. Zouev  Report can be called to: Adult unit: [phone]  Pt can arrive @ 2100   Care Team Notified: [facility] Umberto Ganong, RN, Heyward Loud, NP, Audrie Blind, RN
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
## 2024-02-18 NOTE — ED Triage Notes (Signed)
 Pt c.o SI. Pt has been on a 7 day alcohol binge due to depression. Pt states she has had several shots of liquor and 4 40oz beers today. Pt had a fall last night and hit her head on the toilet, unknown LOC. Abrasion noted to right eyebrow. Pt slurring her words in triage.  Accompanied by best friend who provides much hx

## 2024-02-18 NOTE — ED Notes (Signed)
 Please update [phone] sister

## 2024-02-18 NOTE — ED Provider Notes (Signed)
 [name] [facility] AT [facility] Provider Note   CSN: [id number] Arrival date & time: 02/18/24  1013     Patient presents with: Suicidal, Fall, and Alcohol Intoxication   Julie Conley is a 48 y.o. female.   Patient complains of feeling suicidal.  Patient states that she wants to be committed and go to [facility] [place].  Patient reports that she has had thoughts of suicide.  Patient states that she has been on a alcohol binge.  Patient also reports that she fell last p.m. and hit her head on the toilet.  Patient is unsure if she lost consciousness.  Patient is here with a family member who reports patient has a past medical history of alcohol abuse.  She reports patient is a binge drinker.  She states patient has had months of sobriety but is currently drinking heavy.  Patient is in intensive outpatient therapy which she states is not working.  Patient's family member reports patient has charges pending and has not been compliant with the program.  The history is provided by the patient. No language interpreter was used.  Fall  Alcohol Intoxication       Prior to Admission medications   Medication Sig Start Date End Date Taking? Authorizing Provider  amoxicillin  (AMOXIL ) 500 MG capsule Take by mouth. Patient not taking: Reported on 08/09/2023 04/30/23   [provider]  clonazePAM  (KLONOPIN ) 1 MG tablet Take 1 mg by mouth 2 (two) times daily. 08/06/23   [provider]  cloNIDine  (CATAPRES ) 0.1 MG tablet Take 1 tablet (0.1 mg total) by mouth 2 (two) times daily. 11/15/18   Bary Boss, DO  cyclobenzaprine  (FLEXERIL ) 5 MG tablet Take 1 tablet (5 mg total) by mouth daily as needed for muscle spasms. 11/15/18   Bary Boss, DO  doxycycline  (VIBRA -TABS) 100 MG tablet Take 100 mg by mouth 2 (two) times daily. Patient not taking: Reported on 08/09/2023 04/30/23   [provider]  DULoxetine  (CYMBALTA ) 30 MG capsule Take 1 capsule (30  mg total) by mouth daily. 11/15/18   Bary Boss, DO  EPINEPHrine  0.3 mg/0.3 mL IJ SOAJ injection Inject 0.3 mLs (0.3 mg total) into the muscle as needed for anaphylaxis. 12/25/18   Loman Risk, MD  famotidine  (PEPCID ) 40 MG tablet Take 1 tablet (40 mg total) by mouth daily for 5 days. 12/25/18 12/30/18  Loman Risk, MD  folic acid  (FOLVITE ) 1 MG tablet Take 1 tablet (1 mg total) by mouth daily. 11/15/18   Bary Boss, DO  gabapentin  (NEURONTIN ) 300 MG capsule Take 1 capsule (300 mg total) by mouth 3 (three) times daily. 11/15/18   Bary Boss, DO  hydrOXYzine  (ATARAX /VISTARIL ) 25 MG tablet Take 1-2 tablets (25-50 mg total) by mouth every 8 (eight) hours as needed for itching. 12/25/18   Loman Risk, MD  ibuprofen  (ADVIL ) 800 MG tablet Take 1 tablet (800 mg total) by mouth every 8 (eight) hours as needed for mild pain. 11/01/19   Ward, Clover Dao, DO  levothyroxine  (SYNTHROID , LEVOTHROID) 50 MCG tablet Take 1 tablet (50 mcg total) by mouth daily before breakfast. 11/15/18   Bary Boss, DO  methocarbamol  (ROBAXIN ) 500 MG tablet Take 1 tablet (500 mg total) by mouth every 8 (eight) hours as needed for muscle spasms. 11/01/19   Ward, Clover Dao, DO  mirtazapine  (REMERON ) 15 MG tablet Take 1 tablet (15 mg total) by mouth at bedtime. 11/15/18   Bary Boss, DO  Multiple  Vitamin (MULTIVITAMIN WITH MINERALS) TABS tablet Take 1 tablet by mouth daily. 11/15/18   Bary Boss, DO  naltrexone  (DEPADE) 50 MG tablet Take 50 mg by mouth daily. 07/09/23   [provider]  nicotine  (NICODERM CQ  - DOSED IN MG/24 HOURS) 21 mg/24hr patch Place 1 patch (21 mg total) onto the skin daily. 11/15/18   Bary Boss, DO  pantoprazole  (PROTONIX ) 40 MG tablet Take 1 tablet (40 mg total) by mouth daily. 11/15/18   Bary Boss, DO  predniSONE  (DELTASONE ) 5 MG tablet Take 5 mg by mouth daily. Patient not taking: Reported on 08/09/2023 07/02/23   [provider]  QUEtiapine (SEROQUEL) 100 MG  tablet Take 100 mg by mouth at bedtime. Patient not taking: Reported on 08/09/2023 07/09/23   [provider]  QUEtiapine (SEROQUEL) 300 MG tablet Take 300 mg by mouth at bedtime. 08/06/23   [provider]  QUEtiapine Fumarate 150 MG TABS Take 1 tablet by mouth daily. Patient not taking: Reported on 08/09/2023 04/30/23   [provider]  thiamine  100 MG tablet Take 1 tablet (100 mg total) by mouth daily. 11/15/18   Bary Boss, DO  traMADol  (ULTRAM ) 50 MG tablet Take 50 mg by mouth every 6 (six) hours as needed. Patient not taking: Reported on 08/09/2023 05/31/23   [provider]  venlafaxine  XR (EFFEXOR -XR) 150 MG 24 hr capsule Take 150 mg by mouth daily. 07/09/23   [provider]    Allergies: Adhesive [tape], Naproxen , Ambien [zolpidem tartrate], Ciprofloxacin, Latex, Zofran  [ondansetron ], and Sulfa antibiotics    Review of Systems  All other systems reviewed and are negative.   Updated Vital Signs BP 100/66 (BP Location: Right Arm)   Pulse (!) 116   Temp 97.7 F (36.5 C) (Oral)   Resp 18   SpO2 96%   Physical Exam Vitals and nursing note reviewed.  Constitutional:      Appearance: She is well-developed.  HENT:     Head: Normocephalic.     Comments: Abrasion right forehead    Nose: Nose normal.     Mouth/Throat:     Mouth: Mucous membranes are moist.   Cardiovascular:     Rate and Rhythm: Normal rate.  Pulmonary:     Effort: Pulmonary effort is normal.  Abdominal:     General: There is no distension.   Musculoskeletal:        General: Normal range of motion.     Cervical back: Normal range of motion.   Skin:    General: Skin is warm.   Neurological:     General: No focal deficit present.     Mental Status: She is alert and oriented to person, place, and time.   Psychiatric:     Comments: intoxicated     (all labs ordered are listed, but only abnormal results are displayed) Labs Reviewed  COMPREHENSIVE METABOLIC  PANEL WITH GFR - Abnormal; Notable for the following components:      Result Value   CO2 18 (*)    Glucose, Bld 107 (*)    Calcium 8.7 (*)    Anion gap 17 (*)    All other components within normal limits  ETHANOL - Abnormal; Notable for the following components:   Alcohol, Ethyl (B) 247 (*)    All other components within normal limits  CBC - Abnormal; Notable for the following components:   RBC 3.86 (*)    Hemoglobin 11.2 (*)    HCT 34.0 (*)  Platelets 147 (*)    All other components within normal limits  RAPID URINE DRUG SCREEN, HOSP PERFORMED  ACETAMINOPHEN  LEVEL  SALICYLATE LEVEL    EKG: None  Radiology: CT Head Wo Contrast Result Date: 02/18/2024 CLINICAL DATA:  Head trauma, fall.  Abnormal mental status. EXAM: CT HEAD WITHOUT CONTRAST TECHNIQUE: Contiguous axial images were obtained from the base of the skull through the vertex without intravenous contrast. RADIATION DOSE REDUCTION: This exam was performed according to the departmental dose-optimization program which includes automated exposure control, adjustment of the mA and/or kV according to patient size and/or use of iterative reconstruction technique. COMPARISON:  CT head 04/23/2023. FINDINGS: Brain: No acute intracranial hemorrhage. No CT evidence of acute infarct. No edema, mass effect, or midline shift. The basilar cisterns are patent. Ventricles: The ventricles are normal. Vascular: No hyperdense vessel or unexpected calcification. Skull: No acute or aggressive findings. Chronic appearing deformity of the right lamina papyracea. Orbits: Orbits are symmetric. Sinuses: The visualized paranasal sinuses are clear. Other: Trace fluid in the right mastoid air cells. IMPRESSION: No CT evidence of acute intracranial abnormality. Electronically Signed   By: Denny Flack M.D.   On: 02/18/2024 12:48     Procedures   Medications Ordered in the ED - No data to display                                  Medical Decision  Making Amount and/or Complexity of Data Reviewed Labs: ordered. Radiology: ordered.        Final diagnoses:  Suicidal ideation  Alcoholic intoxication without complication (HCC)  Fall, initial encounter  Abrasion of forehead, initial encounter    ED Discharge Orders     None      TTS consulted    Sandi Crosby, PA-C 02/18/24 1402    Horton, Sidra Dredge, DO 02/18/24 1527

## 2024-02-18 NOTE — ED Notes (Signed)
 Pharmacy to update klonopin  medication administration to match pt's med rec.

## 2024-02-18 NOTE — ED Notes (Signed)
 Safety sitter informed this RN staffing will not be sending an additional sitter to replace them at this time. Safety sitter leaving for the day. Pt's RR even and nonlabored. NADN.

## 2024-02-18 NOTE — ED Notes (Signed)
 Called [facility] to give report. [facility] to call this RN back for report.

## 2024-02-18 NOTE — ED Notes (Signed)
 Pt changed out and wanded by security in triage. Belongings collected and given to sister with patient consent.

## 2024-02-19 DIAGNOSIS — F1020 Alcohol dependence, uncomplicated: Principal | ICD-10-CM | POA: Diagnosis present

## 2024-02-19 LAB — HEMOGLOBIN A1C
Hgb A1c MFr Bld: 5.6 % (ref 4.8–5.6)
Mean Plasma Glucose: 114.02 mg/dL

## 2024-02-19 LAB — LIPID PANEL
Cholesterol: 158 mg/dL (ref 0–200)
HDL: 83 mg/dL (ref >40)
LDL Cholesterol: 58 mg/dL (ref 0–99)
Total CHOL/HDL Ratio: 1.9 ratio
Triglycerides: 83 mg/dL (ref <150)
VLDL: 17 mg/dL (ref 0–40)

## 2024-02-19 MED ORDER — DIPHENHYDRAMINE HCL 50 MG/ML IJ SOLN: 50.0000 mg | Freq: Three times a day (TID) | INTRAMUSCULAR | Status: DC | PRN

## 2024-02-19 MED ORDER — HALOPERIDOL 5 MG PO TABS: 5.0000 mg | ORAL_TABLET | Freq: Three times a day (TID) | ORAL | Status: DC | PRN

## 2024-02-19 MED ORDER — NICOTINE 21 MG/24HR TD PT24
21.0000 mg | MEDICATED_PATCH | Freq: Every day | TRANSDERMAL | Status: DC
  Filled 2024-02-19 (×3): qty 1

## 2024-02-19 MED ORDER — LORAZEPAM 1 MG PO TABS
1.0000 mg | ORAL_TABLET | Freq: Every day | ORAL | Status: AC
  Administered 2024-02-22: 1 mg via ORAL
  Filled 2024-02-19: qty 1

## 2024-02-19 MED ORDER — LORAZEPAM 1 MG PO TABS: 1.0000 mg | ORAL_TABLET | Freq: Four times a day (QID) | ORAL | Status: DC | PRN

## 2024-02-19 MED ORDER — VITAMIN B-1 100 MG PO TABS
100.0000 mg | ORAL_TABLET | Freq: Every day | ORAL | Status: DC
  Filled 2024-02-19 (×2): qty 1

## 2024-02-19 MED ORDER — HALOPERIDOL LACTATE 5 MG/ML IJ SOLN: 10.0000 mg | Freq: Three times a day (TID) | INTRAMUSCULAR | Status: DC | PRN

## 2024-02-19 MED ORDER — LORAZEPAM 1 MG PO TABS
1.0000 mg | ORAL_TABLET | Freq: Four times a day (QID) | ORAL | Status: AC
  Administered 2024-02-19 (×4): 1 mg via ORAL
  Filled 2024-02-19 (×4): qty 1

## 2024-02-19 MED ORDER — DIPHENHYDRAMINE HCL 25 MG PO CAPS: 50.0000 mg | ORAL_CAPSULE | Freq: Three times a day (TID) | ORAL | Status: DC | PRN

## 2024-02-19 MED ORDER — TRAZODONE HCL 50 MG PO TABS
50.0000 mg | ORAL_TABLET | Freq: Every day | ORAL | Status: DC
  Administered 2024-02-19 – 2024-02-21 (×3): 50 mg via ORAL
  Filled 2024-02-19 (×3): qty 1

## 2024-02-19 MED ORDER — ACETAMINOPHEN 325 MG PO TABS
650.0000 mg | ORAL_TABLET | Freq: Four times a day (QID) | ORAL | Status: DC | PRN
  Administered 2024-02-19: 650 mg via ORAL
  Filled 2024-02-19: qty 2

## 2024-02-19 MED ORDER — QUETIAPINE FUMARATE 300 MG PO TABS
300.0000 mg | ORAL_TABLET | Freq: Every day | ORAL | Status: DC
  Administered 2024-02-19 – 2024-02-21 (×3): 300 mg via ORAL
  Filled 2024-02-19 (×3): qty 1

## 2024-02-19 MED ORDER — HALOPERIDOL LACTATE 5 MG/ML IJ SOLN: 5.0000 mg | Freq: Three times a day (TID) | INTRAMUSCULAR | Status: DC | PRN

## 2024-02-19 MED ORDER — MAGNESIUM HYDROXIDE 400 MG/5ML PO SUSP: 30.0000 mL | Freq: Every day | ORAL | Status: DC | PRN

## 2024-02-19 MED ORDER — LORAZEPAM 1 MG PO TABS
1.0000 mg | ORAL_TABLET | Freq: Three times a day (TID) | ORAL | Status: AC
  Administered 2024-02-20 (×3): 1 mg via ORAL
  Filled 2024-02-19 (×3): qty 1

## 2024-02-19 MED ORDER — AMPHETAMINE-DEXTROAMPHETAMINE 10 MG PO TABS
20.0000 mg | ORAL_TABLET | Freq: Every day | ORAL | Status: DC
  Administered 2024-02-19: 20 mg via ORAL
  Filled 2024-02-19: qty 2

## 2024-02-19 MED ORDER — LORAZEPAM 2 MG/ML IJ SOLN: 2.0000 mg | Freq: Three times a day (TID) | INTRAMUSCULAR | Status: DC | PRN

## 2024-02-19 MED ORDER — ADULT MULTIVITAMIN W/MINERALS CH
1.0000 | ORAL_TABLET | Freq: Every day | ORAL | Status: DC
  Administered 2024-02-20 – 2024-02-22 (×3): 1 via ORAL
  Filled 2024-02-19 (×3): qty 1

## 2024-02-19 MED ORDER — VITAMIN B-1 100 MG PO TABS
100.0000 mg | ORAL_TABLET | Freq: Every day | ORAL | Status: DC
  Administered 2024-02-20 – 2024-02-22 (×3): 100 mg via ORAL
  Filled 2024-02-19 (×3): qty 1

## 2024-02-19 MED ORDER — ADULT MULTIVITAMIN W/MINERALS CH
1.0000 | ORAL_TABLET | Freq: Every day | ORAL | Status: DC
  Administered 2024-02-19: 1 via ORAL
  Filled 2024-02-19: qty 1

## 2024-02-19 MED ORDER — THIAMINE HCL 100 MG/ML IJ SOLN: 100.0000 mg | Freq: Once | INTRAMUSCULAR | Status: AC

## 2024-02-19 MED ORDER — HYDROXYZINE HCL 25 MG PO TABS
25.0000 mg | ORAL_TABLET | Freq: Four times a day (QID) | ORAL | Status: AC | PRN
  Administered 2024-02-19 – 2024-02-20 (×3): 25 mg via ORAL
  Filled 2024-02-19 (×3): qty 1

## 2024-02-19 MED ORDER — ALUM & MAG HYDROXIDE-SIMETH 200-200-20 MG/5ML PO SUSP: 30.0000 mL | ORAL | Status: DC | PRN

## 2024-02-19 MED ORDER — LOPERAMIDE HCL 2 MG PO CAPS: 2.0000 mg | ORAL_CAPSULE | ORAL | Status: DC | PRN

## 2024-02-19 MED ORDER — VENLAFAXINE HCL ER 150 MG PO CP24
150.0000 mg | ORAL_CAPSULE | Freq: Every day | ORAL | Status: DC
  Administered 2024-02-19 – 2024-02-20 (×2): 150 mg via ORAL
  Filled 2024-02-19 (×2): qty 1

## 2024-02-19 MED ORDER — LORAZEPAM 1 MG PO TABS
1.0000 mg | ORAL_TABLET | Freq: Two times a day (BID) | ORAL | Status: AC
  Administered 2024-02-21 (×2): 1 mg via ORAL
  Filled 2024-02-19 (×2): qty 1

## 2024-02-19 MED ORDER — HYDROXYZINE HCL 25 MG PO TABS: 25.0000 mg | ORAL_TABLET | Freq: Four times a day (QID) | ORAL | Status: DC | PRN

## 2024-02-19 MED ORDER — LOPERAMIDE HCL 2 MG PO CAPS: 2.0000 mg | ORAL_CAPSULE | ORAL | Status: AC | PRN

## 2024-02-19 NOTE — Plan of Care (Signed)
  Problem: Education: Goal: Ability to state activities that reduce stress will improve Outcome: Not Progressing   Problem: Coping: Goal: Ability to identify and develop effective coping behavior will improve Outcome: Not Progressing   Problem: Self-Concept: Goal: Ability to identify factors that promote anxiety will improve Outcome: Not Progressing Goal: Level of anxiety will decrease Outcome: Not Progressing Goal: Ability to modify response to factors that promote anxiety will improve Outcome: Not Progressing   Problem: Education: Goal: Utilization of techniques to improve thought processes will improve Outcome: Not Progressing Goal: Knowledge of the prescribed therapeutic regimen will improve Outcome: Not Progressing   Problem: Activity: Goal: Interest or engagement in leisure activities will improve Outcome: Not Progressing Goal: Imbalance in normal sleep/wake cycle will improve Outcome: Not Progressing   Problem: Coping: Goal: Coping ability will improve Outcome: Not Progressing Goal: Will verbalize feelings Outcome: Not Progressing   Problem: Health Behavior/Discharge Planning: Goal: Ability to make decisions will improve Outcome: Not Progressing Goal: Compliance with therapeutic regimen will improve Outcome: Not Progressing   Problem: Role Relationship: Goal: Will demonstrate positive changes in social behaviors and relationships Outcome: Not Progressing   Problem: Safety: Goal: Ability to disclose and discuss suicidal ideas will improve Outcome: Not Progressing Goal: Ability to identify and utilize support systems that promote safety will improve Outcome: Not Progressing   Problem: Self-Concept: Goal: Will verbalize positive feelings about self Outcome: Not Progressing Goal: Level of anxiety will decrease Outcome: Not Progressing   Problem: Education: Goal: Ability to make informed decisions regarding treatment will improve Outcome: Not Progressing    Problem: Coping: Goal: Coping ability will improve Outcome: Not Progressing   Problem: Health Behavior/Discharge Planning: Goal: Identification of resources available to assist in meeting health care needs will improve Outcome: Not Progressing   Problem: Medication: Goal: Compliance with prescribed medication regimen will improve Outcome: Not Progressing   Problem: Self-Concept: Goal: Ability to disclose and discuss suicidal ideas will improve Outcome: Not Progressing Goal: Will verbalize positive feelings about self Outcome: Not Progressing Note: Patient is unable to be assessed as therapy was recently initiated. Patient will be evaluated at upcoming provider appointment to assess progress    Problem: Education: Goal: Knowledge of disease or condition will improve Outcome: Not Progressing Goal: Understanding of discharge needs will improve Outcome: Not Progressing   Problem: Health Behavior/Discharge Planning: Goal: Ability to identify changes in lifestyle to reduce recurrence of condition will improve Outcome: Not Progressing Goal: Identification of resources available to assist in meeting health care needs will improve Outcome: Not Progressing   Problem: Physical Regulation: Goal: Complications related to the disease process, condition or treatment will be avoided or minimized Outcome: Not Progressing   Problem: Safety: Goal: Ability to remain free from injury will improve Outcome: Not Progressing

## 2024-02-19 NOTE — [facility] Suicide Risk Assessment (Signed)
 Suicide Risk Assessment  Admission Assessment    [facility] Admission Suicide Risk Assessment   Nursing information obtained from:  Patient  Demographic factors:  Unemployed  Current Mental Status:  Self-harm thoughts  Loss Factors:  Loss of significant relationship  Historical Factors:  Impulsivity, Family history of mental illness or substance abuse  Risk Reduction Factors:  Sense of responsibility to family, Positive social support  Total Time spent with patient: 1.5 hours including H&P.  Principal Problem: Alcohol use disorder, severe, dependence (HCC)  Diagnosis:  Principal Problem:   Alcohol use disorder, severe, dependence (HCC) Active Problems:   Severe episode of recurrent major depressive disorder, without psychotic features (HCC)   Bipolar 1 disorder, depressed, severe (HCC)  Subjective Data: See H&P.  Continued Clinical Symptoms:  The Alcohol Use Disorders Identification Test, Guidelines for Use in Primary Care, Second Edition.  World Science writer [facility]). Score between 0-7:  no or low risk or alcohol related problems. Score between 8-15:  moderate risk of alcohol related problems. Score between 16-19:  high risk of alcohol related problems. Score 20 or above:  warrants further diagnostic evaluation for alcohol dependence and treatment.  CLINICAL FACTORS:   Bipolar Disorder:   Mixed State Alcohol/Substance Abuse/Dependencies More than one psychiatric diagnosis Unstable or Poor Therapeutic Relationship Previous Psychiatric Diagnoses and Treatments Medical Diagnoses and Treatments/Surgeries  Musculoskeletal: Strength & Muscle Tone: within normal limits Gait & Station: normal Patient leans: N/A  Psychiatric Specialty Exam:  Presentation  General Appearance:  Casual (Hair colored in red dye.)  Eye Contact: Fair  Speech: Clear and Coherent; Normal Rate  Speech Volume: Normal  Handedness:Right  Mood and Affect  Mood: Anxious; Depressed (rates  depression #8, anxiety #9.)  Affect: Non-Congruent  Thought Process  Thought Processes: Coherent; Linear  Descriptions of Associations:Intact  Orientation:Full (Time, Place and Person)  Thought Content:Logical  History of Schizophrenia/Schizoaffective disorder: NA  Duration of Psychotic Symptoms: NA  Hallucinations:Hallucinations: None  Ideas of Reference:None  Suicidal Thoughts:Suicidal Thoughts: Yes, Passive SI Passive Intent and/or Plan: Without Intent; Without Plan; Without Means to Carry Out; Without Access to Means  Homicidal Thoughts:Homicidal Thoughts: No  Sensorium  Memory: Immediate Good; Recent Good; Remote Good  Judgment: Poor  Insight: Lacking  Executive Functions  Concentration: Fair  Attention Span: Fair  Recall: Good  Fund of Knowledge: Fair  Language: Good  Psychomotor Activity  Psychomotor Activity:Psychomotor Activity: Normal  Assets  Assets: Communication Skills; Financial Resources/Insurance; Housing; Social Support  Sleep  Sleep:Sleep: Fair Number of Hours of Sleep: 7  Physical Exam:  Blood pressure 103/66, pulse 85, temperature 97.8 F (36.6 C), temperature source Oral, height 5' 3 (1.6 m), weight 91.4 kg, SpO2 100%. Body mass index is 35.71 kg/m.  COGNITIVE FEATURES THAT CONTRIBUTE TO RISK:  Closed-mindedness, Polarized thinking, and Thought constriction (tunnel vision)    SUICIDE RISK:   Moderate:  Frequent suicidal ideation with limited intensity, and duration, some specificity in terms of plans, no associated intent, good self-control, limited dysphoria/symptomatology, some risk factors present, and identifiable protective factors, including available and accessible social support.  PLAN OF CARE: See H&P.  I certify that inpatient services furnished can reasonably be expected to improve the patient's condition.   Asuncion Layer, NP, pmhnp, [id number]. 02/19/2024, 11:11 AM

## 2024-02-19 NOTE — Progress Notes (Signed)
   02/19/24 2300  Psych Admission Type (Psych Patients Only)  Admission Status Voluntary  Psychosocial Assessment  Patient Complaints Irritability;Depression;Anxiety  Eye Contact Fair  Facial Expression Sad  Affect Irritable  Speech Logical/coherent  Interaction Assertive  Motor Activity Slow  Appearance/Hygiene In scrubs  Behavior Characteristics Cooperative;Anxious  Mood Depressed;Anxious;Pleasant  Thought Process  Coherency WDL  Content WDL  Delusions None reported or observed  Perception WDL  Hallucination None reported or observed  Judgment Impaired  Confusion None  Danger to Self  Current suicidal ideation? Passive  Agreement Not to Harm Self Yes  Description of Agreement Verbal  Danger to Others  Danger to Others None reported or observed

## 2024-02-19 NOTE — Progress Notes (Signed)
 Admission note:  This patient is 48 year old caucasian female that presents to this facility with a c/o of increasing depression. Patient states that she has been on a 7-day binge drinking with her last drink on 02/18/2024 7pm. Patient states that, I have been trying to cope and somehow I always end up drinking, I just need some help and I can't take it anymore. Patient reports her mother dying in March 2025, her son and her daughter in-law dying 2 months apart from each other, and she just lost custody of her granddaughter. Patient states, I don't have a plan to kill myself, but I feel like everyone would be better if I wasn't here. Patient denies any hx of suicide attempts. Admits she struggles with grief and alcholism. Patient states that is she is drinking wine she drinks 3 bottles, liquor she drinks 2 bottles, and malt liquor 2 40 oz. Patient alert and oriented x4. Patient appearance is disheveled, she states I dyed my hair before I came here I was so drunk. Patient reports wanting help, and detox from alcohol. Passive SI. Patient able to contract for safety at this time. Denis HI/AVH. Patient has piercing on her ears, nose, and lip that are non removable. Patient oriented to the unit. Patient made aware of the rules and regulations of the unit. Consents signed. Nourishment and hydration given. Patient denies having any questions at this time

## 2024-02-19 NOTE — Progress Notes (Signed)
   02/19/24 0200  Psych Admission Type (Psych Patients Only)  Admission Status Voluntary  Psychosocial Assessment  Patient Complaints Depression;Loneliness;Other (Comment) (grief)  Eye Contact Fair  Facial Expression Anxious;Sad  Affect Anxious;Sad  Speech Logical/coherent  Interaction Assertive  Motor Activity Slow  Appearance/Hygiene Disheveled  Behavior Characteristics Cooperative;Anxious  Mood Depressed;Sad;Pleasant  Thought Process  Coherency WDL  Content WDL  Delusions None reported or observed  Perception WDL  Hallucination None reported or observed  Judgment Impaired  Confusion None  Danger to Self  Current suicidal ideation? Passive  Agreement Not to Harm Self Yes  Description of Agreement verbal  Danger to Others  Danger to Others None reported or observed

## 2024-02-19 NOTE — [facility] Group Notes (Signed)
 [facility] Group Notes:  (Nursing/MHT/Case Management/Adjunct)  Date:  02/19/2024  Time:  10:09 PM  Type of Therapy:  Psychoeducational Skills  Participation Level:  Active  Participation Quality:  Appropriate  Affect:  Appropriate  Cognitive:  Appropriate  Insight:  Appropriate  Engagement in Group:  Engaged  Modes of Intervention:  Education  Summary of Progress/Problems: The patient's goal for today was to restart her medication. She states that she got upset with her doctor today since all of her old medication was discontinued. She feels that she does not get along with her physician and would like to be assigned to someone else. Her goal for tomorrow is to try to keep her thoughts to herself.   Julie Conley S 02/19/2024, 10:09 PM
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
## 2024-02-19 NOTE — Progress Notes (Signed)
   02/19/24 1100  Psych Admission Type (Psych Patients Only)  Admission Status Voluntary  Psychosocial Assessment  Patient Complaints Depression (Grief)  Eye Contact Fair  Facial Expression Anxious;Sad  Affect Anxious;Sad  Speech Logical/coherent  Interaction Assertive  Motor Activity Slow  Appearance/Hygiene Disheveled  Behavior Characteristics Cooperative;Anxious  Mood Depressed;Sad  Thought Process  Coherency WDL  Content WDL  Delusions None reported or observed  Perception WDL  Hallucination None reported or observed  Judgment Impaired  Confusion None  Danger to Self  Current suicidal ideation? Passive  Agreement Not to Harm Self Yes  Description of Agreement Verbal contract  Danger to Others  Danger to Others None reported or observed

## 2024-02-19 NOTE — Progress Notes (Signed)
 Pt did not attend group.

## 2024-02-19 NOTE — Plan of Care (Signed)
°  Problem: Self-Concept: Goal: Ability to identify factors that promote anxiety will improve Outcome: Not Progressing Goal: Level of anxiety will decrease Outcome: Not Progressing

## 2024-02-19 NOTE — Progress Notes (Signed)
 Initial Treatment Plan 02/19/2024 1:50 AM Julie Conley MRN # [id number]       PATIENT STRESSORS: ETOH abuse  Grief Family loss   PATIENT STRENGTHS: Motivation for treatment/growth. Supportive family/friends. Ability for insight Communication skills      PATIENT IDENTIFIED PROBLEMS: My mom died in 2025-04-05my family members in died 2 months died 2 months apart from each other   My granddaughter got taken away from me  I have been drinking for 7 days straight   I try to cope, but I always end up drinking   I dont have a plan, but I feel like people would be better without me                   DISCHARGE CRITERIA:  Improved stabilization in mood, thinking, and/or behavior. Verbal commitment to aftercare and medication compliance. Ability to meet basic life and health needs. Adequate post-discharge living arrangements. Need for constant or close observation no longer present. Reduction of life-threatening or endangering symptoms to within safe limits. Safe-care adequate arrangements made. Medical problems require only outpatient monitoring. Motivation to continue treatment in a less acute level of care.  PRELIMINARY DISCHARGE PLAN: Return to previous living arrangement. Outpatient therapy.  PATIENT/FAMILY INVOLVEMENT: This treatment plan has been presented to and reviewed with the patient, Julie Conley. Julie Conley. The patient have been given the opportunity to ask questions and make suggestions.   [place], [place]  02/19/24 1:56 AM

## 2024-02-19 NOTE — Plan of Care (Signed)
  Problem: Coping: Goal: Ability to identify and develop effective coping behavior will improve Outcome: Not Progressing   Problem: Self-Concept: Goal: Ability to identify factors that promote anxiety will improve Outcome: Not Progressing Goal: Level of anxiety will decrease Outcome: Not Progressing   

## 2024-02-20 DIAGNOSIS — F1020 Alcohol dependence, uncomplicated: Secondary | ICD-10-CM

## 2024-02-20 MED ORDER — VENLAFAXINE HCL ER 75 MG PO CP24
225.0000 mg | ORAL_CAPSULE | Freq: Every day | ORAL | Status: DC
  Administered 2024-02-21 – 2024-02-22 (×2): 225 mg via ORAL
  Filled 2024-02-20 (×2): qty 1

## 2024-02-20 MED ORDER — VENLAFAXINE HCL ER 75 MG PO CP24
75.0000 mg | ORAL_CAPSULE | Freq: Once | ORAL | Status: AC
  Administered 2024-02-20: 75 mg via ORAL
  Filled 2024-02-20: qty 1

## 2024-02-20 MED ORDER — IBUPROFEN 800 MG PO TABS
800.0000 mg | ORAL_TABLET | Freq: Three times a day (TID) | ORAL | Status: DC | PRN
  Administered 2024-02-20: 800 mg via ORAL
  Filled 2024-02-20: qty 1

## 2024-02-20 NOTE — Plan of Care (Signed)
?  Problem: Education: Goal: Ability to state activities that reduce stress will improve Outcome: Progressing   Problem: Self-Concept: Goal: Ability to identify factors that promote anxiety will improve Outcome: Progressing Goal: Ability to modify response to factors that promote anxiety will improve Outcome: Progressing   
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
## 2024-02-20 NOTE — Progress Notes (Signed)
 Patient signed a 72 hour request for discharge 02/20/24 @ 1103

## 2024-02-20 NOTE — Progress Notes (Signed)
   02/20/24 1000  Psych Admission Type (Psych Patients Only)  Admission Status Voluntary  Psychosocial Assessment  Patient Complaints Irritability;Depression;Anxiety  Eye Contact Fair  Facial Expression Sad  Affect Irritable  Speech Logical/coherent  Interaction Assertive  Motor Activity Slow  Appearance/Hygiene Disheveled  Behavior Characteristics Cooperative;Anxious  Mood Depressed;Anxious  Thought Process  Coherency WDL  Content WDL  Delusions None reported or observed  Perception WDL  Hallucination None reported or observed  Judgment Impaired  Confusion None  Danger to Self  Current suicidal ideation? Passive  Agreement Not to Harm Self Yes  Description of Agreement Verbal Contract  Danger to Others  Danger to Others None reported or observed

## 2024-02-20 NOTE — Progress Notes (Signed)
 [facility] MD Progress Note  02/20/2024 9:22 AM Julie Conley  MRN:  [id number]  Reason for admission: 48 year old Caucasian female. Ad[facility] to the [facility] from the [facility] with complaints of alcohol intoxications and worsening symptoms of depression due to grief. A review of her current lab results has shown her toxicology results to be 247 & UDS was clear of any other substances. Per chart review reports, patient seems to have extensive hx of alcohol use disorder, chronic. She seems to have received treatments at the [facility], [facility] & [facility] health care in the past. Chart review reports also showed patient was receiving treatments on an outpatient basis for bipolar disorder. After medical evaluation, stabilization & clearance, patient was transferred to the [facility] for alcohol detoxification treatment and further psychiatric evaluation/treatments. Patient's current lab results has been reviewed.    Daily notes: Julie Conley. She presents alert, oriented & aware of situation. She is visible on the unit, attending group sessions. She is making a good eye contact & verbally responsive. She reports, I'm not doing well. Why am I not on my Oxycodone , Klonopin  & Adderall. I cannot function without those medicines. If you guys are not going to be giving those medicines to me, I need to be discharged right now. Julie Conley was explained that after discharge, to go back to her prescriber for the three medications to be re-evaluated prior to going back on those medicines. She reported on admission that she has not been taking those medicines in a week prior to her hospitalization because she was binge drinking heavily. However, she was re-started on Flexeril  10 mg po tid prn. She did ask for Ibuprofen  800 mg, although she has an allergy to naproxen . Patient replied, I can't take Naproxen  because it causes me to have acid reflux symptoms, but I do take Ibuprofen . A review of her home  medication, patient was on Ibuprofen  800 mg prn. Patient also expressed that the reason she came to this hospital is also to adjust the dose of her Effexor  XR because she has been feeling more depressed lately. Patient was given a choice to taper of Effexor  & start on Cymbalta  to help with her depression/pain. She declined, saying she has tried Cymbalta  in the past & it did not help her. Her Effexor  XR is increased to 225 mg daily for depress. Patient has signed a 72 hour discharge notice. She remains on the [facility] detox protocols including her other medications as already in progress. Patient is currently in no apparent distress. Continue current plan of care as already in progress. Vital signs, stable.  Principal Problem: Alcohol use disorder, severe, dependence (HCC)  Diagnosis: Principal Problem:   Alcohol use disorder, severe, dependence (HCC) Active Problems:   Severe episode of recurrent major depressive disorder, without psychotic features (HCC)   Bipolar 1 disorder, depressed, severe (HCC)  Total Time spent with patient: 45 minutes  Past Psychiatric History: See H&P.  Past Medical History:  Past Medical History:  Diagnosis Date   Alcohol abuse    Anxiety    Bimalleolar fracture of left ankle 04/2015   Complication of anesthesia    hypotension with breast augmentation   Depression    GERD (gastroesophageal reflux disease)    Headache    once/week (03/20/2016)   Hypothyroidism 2010-2012   stopped RX in 2012 when I didn't meet standards (03/20/2016)   Iron deficiency anemia    Migraines    once/month (03/20/2016)   Pneumonia  several times   Sciatic pain 04/12/2015   left    Past Surgical History:  Procedure Laterality Date   ABDOMINOPLASTY  2015   APPENDECTOMY  ~ 1986   AUGMENTATION MAMMAPLASTY  2015   CESAREAN SECTION  1996; 2000   CYSTOSCOPY WITH HYDRODISTENSION AND BIOPSY  2010   DILATION AND CURETTAGE OF UTERUS  2010   ENDOMETRIAL ABLATION  2010   FRACTURE  SURGERY     LAPAROSCOPIC CHOLECYSTECTOMY  2006   LAPAROSCOPIC TUBAL LIGATION  2001   w/banding   LAPAROTOMY  2011   NASAL SEPTOPLASTY W/ TURBINOPLASTY  1991; 2008   ORIF ANKLE FRACTURE Left 04/15/2015   Procedure: OPEN REDUCTION INTERNAL FIXATION (ORIF) LEFT BIMALLEOLAR AND SYNDESMOSIS ANKLE FRACTURE;  Surgeon: Saundra Curl, MD;  Location: [facility];  Service: Orthopedics;  Laterality: Left;   ROUX-EN-Y GASTRIC BYPASS  2009   SHOULDER ARTHROSCOPY     TONSILLECTOMY  ~ 1981   VAGINAL HYSTERECTOMY  2013   complete   Family History:  Family History  Problem Relation Age of Onset   Drug abuse Brother    Alcohol abuse Father    Drug abuse Mother    Family Psychiatric  History: See H&P.  Social History:  Social History   Substance and Sexual Activity  Alcohol Use Yes   Comment: Last 7 days in a row; Heavy use     Social History   Substance and Sexual Activity  Drug Use No    Social History   Socioeconomic History   Marital status: Widowed    Spouse name: Not on file   Number of children: 2   Years of education: 4   Highest education level: Not on file  Occupational History   Occupation: Registered nurse    Comment: License is suspended for DUI until May or June  Tobacco Use   Smoking status: Every Day    Current packs/day: 0.25    Average packs/day: 0.3 packs/day for 0.5 years (0.1 ttl pk-yrs)    Types: Cigarettes   Smokeless tobacco: Never  Vaping Use   Vaping status: Never Used  Substance and Sexual Activity   Alcohol use: Yes    Comment: Last 7 days in a row; Heavy use   Drug use: No   Sexual activity: Not on file  Other Topics Concern   Not on file  Social History Narrative   ** Merged History Encounter **       Social Drivers of Health   Financial Resource Strain: Not on file  Food Insecurity: No Food Insecurity (02/19/2024)   Hunger Vital Sign    Worried About Running Out of Food in the Last Year: Never true    Ran Out of Food in  the Last Year: Never true  Transportation Needs: No Transportation Needs (02/19/2024)   PRAPARE - Administrator, Civil Service (Medical): No    Lack of Transportation (Non-Medical): No  Physical Activity: Not on file  Stress: Not on file  Social Connections: Not on file   Additional Social History:   Sleep: Good Estimated Sleeping Duration (Last 24 Hours): 7.00-8.00 hours  Appetite:  Good  Current Medications: Current Facility-Administered Medications  Medication Dose Route Frequency Provider Last Rate Last Admin   acetaminophen  (TYLENOL ) tablet 650 mg  650 mg Oral Q6H PRN Volanda Gruber, NP   650 mg at 02/19/24 2133   alum & mag hydroxide-simeth (MAALOX/MYLANTA) 200-200-20 MG/5ML suspension 30 mL  30 mL Oral Q4H  PRN Volanda Gruber, NP       haloperidol (HALDOL) tablet 5 mg  5 mg Oral TID PRN Stevens, Terry J, NP       And   diphenhydrAMINE (BENADRYL) capsule 50 mg  50 mg Oral TID PRN Volanda Gruber, NP       haloperidol lactate (HALDOL) injection 5 mg  5 mg Intramuscular TID PRN Stevens, Terry J, NP       And   diphenhydrAMINE (BENADRYL) injection 50 mg  50 mg Intramuscular TID PRN Volanda Gruber, NP       And   LORazepam  (ATIVAN ) injection 2 mg  2 mg Intramuscular TID PRN Volanda Gruber, NP       haloperidol lactate (HALDOL) injection 10 mg  10 mg Intramuscular TID PRN Volanda Gruber, NP       And   diphenhydrAMINE (BENADRYL) injection 50 mg  50 mg Intramuscular TID PRN Volanda Gruber, NP       And   LORazepam  (ATIVAN ) injection 2 mg  2 mg Intramuscular TID PRN Volanda Gruber, NP       hydrOXYzine  (ATARAX ) tablet 25 mg  25 mg Oral Q6H PRN Asuncion Layer I, NP   25 mg at 02/19/24 2133   loperamide  (IMODIUM ) capsule 2-4 mg  2-4 mg Oral PRN Kimberely Mccannon I, NP       LORazepam  (ATIVAN ) tablet 1 mg  1 mg Oral TID Airam Heidecker I, NP   1 mg at 02/20/24 0865   Followed by   Cecily Cohen ON 02/21/2024] LORazepam  (ATIVAN ) tablet 1 mg  1 mg Oral BID Yanina Knupp I, NP        Followed by   Cecily Cohen ON 02/22/2024] LORazepam  (ATIVAN ) tablet 1 mg  1 mg Oral Daily Joeziah Voit I, NP       magnesium  hydroxide (MILK OF MAGNESIA) suspension 30 mL  30 mL Oral Daily PRN Volanda Gruber, NP       multivitamin with minerals tablet 1 tablet  1 tablet Oral Daily Asuncion Layer I, NP   1 tablet at 02/20/24 0820   nicotine  (NICODERM CQ  - dosed in mg/24 hours) patch 21 mg  21 mg Transdermal Q0600 Volanda Gruber, NP       QUEtiapine (SEROQUEL) tablet 300 mg  300 mg Oral QHS Volanda Gruber, NP   300 mg at 02/19/24 2133   thiamine  (Vitamin B-1) tablet 100 mg  100 mg Oral Daily Emmanuell Kantz I, NP   100 mg at 02/20/24 0820   traZODone  (DESYREL ) tablet 50 mg  50 mg Oral QHS Volanda Gruber, NP   50 mg at 02/19/24 2133   venlafaxine  XR (EFFEXOR -XR) 24 hr capsule 150 mg  150 mg Oral Q breakfast Volanda Gruber, NP   150 mg at 02/20/24 7846   Lab Results:  Results for orders placed or performed during the hospital encounter of 02/18/24 (from the past 48 hours)  Hemoglobin A1c     Status: None   Collection Time: 02/19/24  6:54 AM  Result Value Ref Range   Hgb A1c MFr Bld 5.6 4.8 - 5.6 %    Comment: (NOTE) Diagnosis of Diabetes The following HbA1c ranges recommended by the American Diabetes Association (ADA) may be used as an aid in the diagnosis of diabetes mellitus.  Hemoglobin             Suggested A1C NGSP%  Diagnosis  <5.7                   Non Diabetic  5.7-6.4                Pre-Diabetic  >6.4                   Diabetic  <7.0                   Glycemic control for                       adults with diabetes.     Mean Plasma Glucose 114.02 mg/dL    Comment: Performed at [facility], [address], [place], [place] [address]  Lipid panel     Status: None   Collection Time: 02/19/24  6:54 AM  Result Value Ref Range   Cholesterol 158 0 - 200 mg/dL   Triglycerides 83 <884 mg/dL   HDL 83 >16 mg/dL   Total CHOL/HDL Ratio 1.9 RATIO   VLDL 17 0 -  40 mg/dL   LDL Cholesterol 58 0 - 99 mg/dL    Comment:        Total Cholesterol/HDL:CHD Risk Coronary Heart Disease Risk Table                     Men   Women  1/2 Average Risk   3.4   3.3  Average Risk       5.0   4.4  2 X Average Risk   9.6   7.1  3 X Average Risk  23.4   11.0        Use the calculated Patient Ratio above and the CHD Risk Table to determine the patient's CHD Risk.        ATP III CLASSIFICATION (LDL):  <100     mg/dL   Optimal  606-301  mg/dL   Near or Above                    Optimal  130-159  mg/dL   Borderline  601-093  mg/dL   High  >235     mg/dL   Very High Performed at [facility], [address] [place], [place] [address]    Blood Alcohol level:  Lab Results  Component Value Date   ETH 247 (H) 02/18/2024   ETH 329 (HH) 08/08/2023   Metabolic Disorder Labs: Lab Results  Component Value Date   HGBA1C 5.6 02/19/2024   MPG 114.02 02/19/2024   MPG 105 01/20/2016   No results found for: PROLACTIN Lab Results  Component Value Date   CHOL 158 02/19/2024   TRIG 83 02/19/2024   HDL 83 02/19/2024   CHOLHDL 1.9 02/19/2024   VLDL 17 02/19/2024   LDLCALC 58 02/19/2024   Physical Findings: AIMS:  ,  ,  ,  ,  ,  ,   CIWA:  CIWA-Ar Total: 0 COWS:     Musculoskeletal: Strength & Muscle Tone: within normal limits Gait & Station: normal Patient leans: N/A  Psychiatric Specialty Exam:  Presentation  General Appearance:  Casual (Hair colored in red dye.)  Eye Contact: Fair  Speech: Clear and Coherent; Normal Rate  Speech Volume: Normal  Handedness: Right  Mood and Affect  Mood: Anxious; Depressed (rates depression #8, anxiety #9.)  Affect: Non-Congruent  Thought Process  Thought Processes: Coherent; Linear  Descriptions  of Associations:Intact  Orientation:Full (Time, Place and Person)  Thought Content:Logical  History of Schizophrenia/Schizoaffective disorder:No data recorded Duration of Psychotic Symptoms:No  data recorded Hallucinations:Hallucinations: None  Ideas of Reference:None  Suicidal Thoughts:Suicidal Thoughts: Yes, Passive SI Passive Intent and/or Plan: Without Intent; Without Plan; Without Means to Carry Out; Without Access to Means  Homicidal Thoughts:Homicidal Thoughts: No  Sensorium  Memory: Immediate Good; Recent Good; Remote Good  Judgment: Poor  Insight: Lacking  Executive Functions  Concentration: Fair  Attention Span: Fair  Recall: Good  Fund of Knowledge: Fair  Language: Good  Psychomotor Activity  Psychomotor Activity: Psychomotor Activity: Normal  Assets  Assets: Communication Skills; Financial Resources/Insurance; Housing; Social Support  Sleep  Sleep: Sleep: Fair Number of Hours of Sleep: 7  Physical Exam: Physical Exam Vitals and nursing note reviewed.  HENT:     Head: Normocephalic.     Nose: Nose normal.   Cardiovascular:     Rate and Rhythm: Normal rate.     Pulses: Normal pulses.  Pulmonary:     Effort: Pulmonary effort is normal.  Genitourinary:    Comments: Deferred  Musculoskeletal:        General: Normal range of motion.     Cervical back: Normal range of motion.   Skin:    General: Skin is dry.   Neurological:     General: No focal deficit present.     Mental Status: She is alert and oriented to person, place, and time.    Review of Systems  Constitutional:  Negative for chills, diaphoresis and fever.  HENT:  Negative for congestion and sore throat.   Respiratory:  Negative for cough, shortness of breath and wheezing.   Cardiovascular:  Negative for chest pain and palpitations.  Gastrointestinal:  Negative for abdominal pain, constipation, diarrhea, heartburn, nausea and vomiting.  Neurological:  Negative for dizziness, tingling, tremors, sensory change, speech change, focal weakness, seizures (Hx alcohol withdrawal seizures.), loss of consciousness, weakness and headaches.  Endo/Heme/Allergies:         Reviewed allergy lists.  Psychiatric/Behavioral:  Positive for depression and substance abuse. Negative for hallucinations, memory loss and suicidal ideas. The patient is nervous/anxious. The patient does not have insomnia.    Blood pressure 112/88, pulse 82, temperature 98.7 F (37.1 C), temperature source Oral, height 5' 3 (1.6 m), weight 91.4 kg, SpO2 99%. Body mass index is 35.71 kg/m.  Treatment Plan Summary: Daily contact with patient to assess and evaluate symptoms and progress in treatment and Medication management.  Principal/active diagnoses.  Alcohol use disorder, severe, dependence (HCC)   Other active Problems: Severe episode of recurrent major depressive disorder, without psychotic features (HCC) Bipolar 1 disorder, depressed, severe (HCC)  Plan: The risks/benefits/side-effects/alternatives to the medications in use were discussed in detail with the patient and time was given for patient's questions. The patient consents to medication trial.    -Continue the [facility] detox protocols for alcohol withdrawal management.  -Increased Effexor  XR from 150 mg to 225 mg po daily for depression.  -Continue Seroquel 300 mg po Q hs for mood control.  -Continue Hydroxyzine  25 mg po tid prn for anxiety.  -Continue Nico-derm patch Q 24 hrs for smoking cessation.   Agitation protocols.  -Continue as recommended (see MAR).   Other PRNS -Continue Tylenol  650 mg every 6 hours PRN for mild pain. -Continue Maalox 30 ml Q 4 hrs PRN for indigestion. -Continue MOM 30 ml po Q 6 hrs for constipation.  -Continue Flexeril  10 mg po tid prn.  -  Continue Ibuprofen  800 mg po tid prn for pain.   Safety and Monitoring: Voluntary admission to inpatient psychiatric unit for safety, stabilization and treatment Daily contact with patient to assess and evaluate symptoms and progress in treatment Patient's case to be discussed in multi-disciplinary team meeting Observation Level : q15 minute checks Vital  signs: q12 hours Precautions: Safety   Discharge Planning: Social work and case management to assist with discharge planning and identification of hospital follow-up needs prior to discharge Estimated LOS: 5-7 days Discharge Concerns: Need to establish a safety plan; Medication compliance and effectiveness Discharge Goals: Return home with outpatient referrals for mental health follow-up including medication management/psychotherapy  Asuncion Layer, NP, pmhnp, [id number] 02/20/2024, 9:22 AM

## 2024-02-20 NOTE — Group Note (Signed)
 Date:  02/20/2024 Time:  9:05 AM  Group Topic/Focus:  Goals Group:   The focus of this group is to help patients establish daily goals to achieve during treatment and discuss how the patient can incorporate goal setting into their daily lives to aide in recovery. Orientation:   The focus of this group is to educate the patient on the purpose and policies of crisis stabilization and provide a format to answer questions about their admission.  The group details unit policies and expectations of patients while admitted.    Participation Level:  Did Not Attend

## 2024-02-20 NOTE — Group Note (Signed)
 Date:  02/20/2024 Time:  9:49 PM  Group Topic/Focus:  Wrap-Up Group:   The focus of this group is to help patients review their daily goal of treatment and discuss progress on daily workbooks.    Participation Level:  Active  Participation Quality:  Appropriate and Attentive  Affect:  Angry, Blunted, and Irritable  Cognitive:  Alert and Appropriate  Insight: Appropriate  Engagement in Group:  Engaged  Modes of Intervention:  Discussion, Education, and Problem-solving  Additional Comments:  Pt attended and participated in wrap up group this evening, but has no rating for their day. Pt states that they were lied to about the 72 hour request form and was under the impression that this form would permit them to be D/C. Pt further states that they were not explained the 72 hour forms process until after they signed it, in which they felt deceived. Pt feels that they are not receiving the medications that they need and being here at [facility] is a waste of time. Pt threatens to show my whole white ass, if they are not D/C tomorrow as promised. Writer explained, the possible outcomes if they have such an outburst, in which pt appeared to receive. Pt complains of nausea.   Eligah Grow 02/20/2024, 9:49 PM

## 2024-02-20 NOTE — Group Note (Signed)
 LCSW Group Therapy Note  Group Date: 02/20/2024 Start Time: 1100 End Time: 1200   Type of Therapy and Topic:  Group Therapy - Healthy vs Unhealthy Coping Skills  Participation Level:  Did Not Attend   Description of Group The focus of this group was to determine what unhealthy coping techniques typically are used by group members and what healthy coping techniques would be helpful in coping with various problems. Patients were guided in becoming aware of the differences between healthy and unhealthy coping techniques. Patients were asked to identify 2-3 healthy coping skills they would like to learn to use more effectively.  Therapeutic Goals Patients learned that coping is what human beings do all day long to deal with various situations in their lives Patients defined and discussed healthy vs unhealthy coping techniques Patients identified their preferred coping techniques and identified whether these were healthy or unhealthy Patients determined 2-3 healthy coping skills they would like to become more familiar with and use more often. Patients provided support and ideas to each other   Summary of Patient Progress:   Patient did not attend group.   Therapeutic Modalities Cognitive Behavioral Therapy Motivational Interviewing  Haileyann Staiger M Fatina Sprankle, LCSWA 02/20/2024  11:52 AM

## 2024-02-21 ENCOUNTER — Encounter (HOSPITAL_COMMUNITY): Payer: Self-pay

## 2024-02-21 DIAGNOSIS — F1020 Alcohol dependence, uncomplicated: Secondary | ICD-10-CM | POA: Diagnosis not present

## 2024-02-21 MED ORDER — CLONAZEPAM 0.5 MG PO TABS
0.5000 mg | ORAL_TABLET | Freq: Three times a day (TID) | ORAL | Status: DC
  Administered 2024-02-21 – 2024-02-22 (×3): 0.5 mg via ORAL
  Filled 2024-02-21 (×3): qty 1

## 2024-02-21 MED ORDER — ARIPIPRAZOLE 5 MG PO TABS
5.0000 mg | ORAL_TABLET | Freq: Every day | ORAL | Status: DC
  Administered 2024-02-22: 5 mg via ORAL
  Filled 2024-02-21: qty 7
  Filled 2024-02-21: qty 1

## 2024-02-21 MED ORDER — GABAPENTIN 100 MG PO CAPS
200.0000 mg | ORAL_CAPSULE | Freq: Three times a day (TID) | ORAL | Status: DC
  Filled 2024-02-21: qty 2

## 2024-02-21 MED ORDER — ARIPIPRAZOLE 5 MG PO TABS
5.0000 mg | ORAL_TABLET | Freq: Once | ORAL | Status: AC
  Administered 2024-02-21: 5 mg via ORAL
  Filled 2024-02-21: qty 1

## 2024-02-21 MED ORDER — OXYCODONE-ACETAMINOPHEN 5-325 MG PO TABS
1.0000 | ORAL_TABLET | Freq: Three times a day (TID) | ORAL | Status: DC | PRN
  Administered 2024-02-21 – 2024-02-22 (×3): 1 via ORAL
  Filled 2024-02-21 (×3): qty 1

## 2024-02-21 NOTE — Plan of Care (Signed)
  Problem: Activity: Goal: Sleeping patterns will improve Outcome: Progressing   Problem: Coping: Goal: Ability to demonstrate self-control will improve Outcome: Progressing   Problem: Activity: Goal: Interest or engagement in leisure activities will improve Outcome: Not Progressing

## 2024-02-21 NOTE — BH IP Treatment Plan (Signed)
 Interdisciplinary Treatment and Diagnostic Plan Update  02/21/2024 Time of Session: 1307 Kelcy Laible MRN: [id number]  Principal Diagnosis: Alcohol use disorder, severe, dependence (HCC)  Secondary Diagnoses: Principal Problem:   Alcohol use disorder, severe, dependence (HCC) Active Problems:   Severe episode of recurrent major depressive disorder, without psychotic features (HCC)   Bipolar 1 disorder, depressed, severe (HCC)   Current Medications:  Current Facility-Administered Medications  Medication Dose Route Frequency Provider Last Rate Last Admin   acetaminophen  (TYLENOL ) tablet 650 mg  650 mg Oral Q6H PRN Volanda Gruber, NP   650 mg at 02/19/24 2133   alum & mag hydroxide-simeth (MAALOX/MYLANTA) 200-200-20 MG/5ML suspension 30 mL  30 mL Oral Q4H PRN Volanda Gruber, NP       ARIPiprazole (ABILIFY) tablet 5 mg  5 mg Oral Once Zouev, Dmitri, MD       [START ON 02/22/2024] ARIPiprazole (ABILIFY) tablet 5 mg  5 mg Oral Daily Zouev, Dmitri, MD       clonazePAM  (KLONOPIN ) tablet 0.5 mg  0.5 mg Oral TID Zouev, Dmitri, MD   0.5 mg at 02/21/24 1304   haloperidol (HALDOL) tablet 5 mg  5 mg Oral TID PRN Volanda Gruber, NP       And   diphenhydrAMINE (BENADRYL) capsule 50 mg  50 mg Oral TID PRN Volanda Gruber, NP       haloperidol lactate (HALDOL) injection 5 mg  5 mg Intramuscular TID PRN Stevens, Terry J, NP       And   diphenhydrAMINE (BENADRYL) injection 50 mg  50 mg Intramuscular TID PRN Volanda Gruber, NP       And   LORazepam  (ATIVAN ) injection 2 mg  2 mg Intramuscular TID PRN Volanda Gruber, NP       haloperidol lactate (HALDOL) injection 10 mg  10 mg Intramuscular TID PRN Volanda Gruber, NP       And   diphenhydrAMINE (BENADRYL) injection 50 mg  50 mg Intramuscular TID PRN Volanda Gruber, NP       And   LORazepam  (ATIVAN ) injection 2 mg  2 mg Intramuscular TID PRN Volanda Gruber, NP       gabapentin  (NEURONTIN ) capsule 200 mg  200 mg Oral TID Zouev,  Dmitri, MD       hydrOXYzine  (ATARAX ) tablet 25 mg  25 mg Oral Q6H PRN Asuncion Layer I, NP   25 mg at 02/20/24 2112   loperamide  (IMODIUM ) capsule 2-4 mg  2-4 mg Oral PRN Asuncion Layer I, NP       LORazepam  (ATIVAN ) tablet 1 mg  1 mg Oral BID Nwoko, Agnes I, NP   1 mg at 02/21/24 4098   Followed by   Cecily Cohen ON 02/22/2024] LORazepam  (ATIVAN ) tablet 1 mg  1 mg Oral Daily Nwoko, Agnes I, NP       magnesium  hydroxide (MILK OF MAGNESIA) suspension 30 mL  30 mL Oral Daily PRN Volanda Gruber, NP       multivitamin with minerals tablet 1 tablet  1 tablet Oral Daily Asuncion Layer I, NP   1 tablet at 02/21/24 0837   nicotine  (NICODERM CQ  - dosed in mg/24 hours) patch 21 mg  21 mg Transdermal Q0600 Volanda Gruber, NP       oxyCODONE -acetaminophen  (PERCOCET/ROXICET) 5-325 MG per tablet 1 tablet  1 tablet Oral Q8H PRN Bennett, Christal H, NP   1 tablet at 02/21/24 1320   QUEtiapine (SEROQUEL) tablet 300 mg  300 mg Oral QHS Volanda Gruber, NP   300 mg at 02/20/24 2112   thiamine  (Vitamin B-1) tablet 100 mg  100 mg Oral Daily Nwoko, Agnes I, NP   100 mg at 02/21/24 9562   traZODone  (DESYREL ) tablet 50 mg  50 mg Oral QHS Volanda Gruber, NP   50 mg at 02/20/24 2112   venlafaxine  XR (EFFEXOR -XR) 24 hr capsule 225 mg  225 mg Oral Q breakfast Asuncion Layer I, NP   225 mg at 02/21/24 1308   PTA Medications: Medications Prior to Admission  Medication Sig Dispense Refill Last Dose/Taking   amphetamine-dextroamphetamine (ADDERALL) 20 MG tablet Take 20 mg by mouth in the morning.      clonazePAM  (KLONOPIN ) 1 MG tablet Take 1 mg by mouth 2 (two) times daily.      cyclobenzaprine  (FLEXERIL ) 10 MG tablet Take 10 mg by mouth in the morning, at noon, and at bedtime.      ibuprofen  (ADVIL ) 800 MG tablet Take 1 tablet (800 mg total) by mouth every 8 (eight) hours as needed for mild pain. 30 tablet 0    MOUNJARO 7.5 MG/0.5ML Pen Inject 7.5 mg into the skin once a week. On Sunday      Multiple Vitamin (MULTIVITAMIN WITH  MINERALS) TABS tablet Take 1 tablet by mouth daily. (Patient taking differently: Take 1 tablet by mouth in the morning.) 30 tablet 0    oxyCODONE -acetaminophen  (PERCOCET) 10-325 MG tablet Take 1 tablet by mouth 3 (three) times daily as needed for pain.      promethazine  (PHENERGAN ) 25 MG tablet Take 25 mg by mouth 4 (four) times daily as needed.      QUEtiapine (SEROQUEL) 300 MG tablet Take 300 mg by mouth at bedtime.      traZODone  (DESYREL ) 50 MG tablet Take 50 mg by mouth at bedtime.      venlafaxine  XR (EFFEXOR -XR) 150 MG 24 hr capsule Take 150 mg by mouth in the morning.       Patient Stressors:    Patient Strengths:    Treatment Modalities: Medication Management, Group therapy, Case management,  1 to 1 session with clinician, Psychoeducation, Recreational therapy.   Physician Treatment Plan for Primary Diagnosis: Alcohol use disorder, severe, dependence (HCC) Long Term Goal(s): Improvement in symptoms so as ready for discharge   Short Term Goals: Ability to identify and develop effective coping behaviors will improve Ability to maintain clinical measurements within normal limits will improve Compliance with prescribed medications will improve Ability to identify triggers associated with substance abuse/mental health issues will improve Ability to identify changes in lifestyle to reduce recurrence of condition will improve Ability to verbalize feelings will improve Ability to disclose and discuss suicidal ideas Ability to demonstrate self-control will improve  Medication Management: Evaluate patient's response, side effects, and tolerance of medication regimen.  Therapeutic Interventions: 1 to 1 sessions, Unit Group sessions and Medication administration.  Evaluation of Outcomes: Progressing  Physician Treatment Plan for Secondary Diagnosis: Principal Problem:   Alcohol use disorder, severe, dependence (HCC) Active Problems:   Severe episode of recurrent major depressive  disorder, without psychotic features (HCC)   Bipolar 1 disorder, depressed, severe (HCC)  Long Term Goal(s): Improvement in symptoms so as ready for discharge   Short Term Goals: Ability to identify and develop effective coping behaviors will improve Ability to maintain clinical measurements within normal limits will improve Compliance with prescribed medications will improve Ability to identify triggers associated with substance abuse/mental health issues will improve  Ability to identify changes in lifestyle to reduce recurrence of condition will improve Ability to verbalize feelings will improve Ability to disclose and discuss suicidal ideas Ability to demonstrate self-control will improve     Medication Management: Evaluate patient's response, side effects, and tolerance of medication regimen.  Therapeutic Interventions: 1 to 1 sessions, Unit Group sessions and Medication administration.  Evaluation of Outcomes: Progressing   RN Treatment Plan for Primary Diagnosis: Alcohol use disorder, severe, dependence (HCC) Long Term Goal(s): Knowledge of disease and therapeutic regimen to maintain health will improve  Short Term Goals: Ability to remain free from injury will improve, Ability to verbalize frustration and anger appropriately will improve, Ability to demonstrate self-control, Ability to participate in decision making will improve, Ability to verbalize feelings will improve, Ability to disclose and discuss suicidal ideas, Ability to identify and develop effective coping behaviors will improve, and Compliance with prescribed medications will improve  Medication Management: RN will administer medications as ordered by provider, will assess and evaluate patient's response and provide education to patient for prescribed medication. RN will report any adverse and/or side effects to prescribing provider.  Therapeutic Interventions: 1 on 1 counseling sessions, Psychoeducation, Medication  administration, Evaluate responses to treatment, Monitor vital signs and CBGs as ordered, Perform/monitor CIWA, COWS, AIMS and Fall Risk screenings as ordered, Perform wound care treatments as ordered.  Evaluation of Outcomes: Not Progressing   LCSW Treatment Plan for Primary Diagnosis: Alcohol use disorder, severe, dependence (HCC) Long Term Goal(s): Safe transition to appropriate next level of care at discharge, Engage patient in therapeutic group addressing interpersonal concerns.  Short Term Goals: Engage patient in aftercare planning with referrals and resources, Increase social support, Increase ability to appropriately verbalize feelings, Increase emotional regulation, Facilitate acceptance of mental health diagnosis and concerns, Facilitate patient progression through stages of change regarding substance use diagnoses and concerns, Identify triggers associated with mental health/substance abuse issues, and Increase skills for wellness and recovery  Therapeutic Interventions: Assess for all discharge needs, 1 to 1 time with Social worker, Explore available resources and support systems, Assess for adequacy in community support network, Educate family and significant other(s) on suicide prevention, Complete Psychosocial Assessment, Interpersonal group therapy.  Evaluation of Outcomes: Not Progressing   Progress in Treatment: Attending groups: Yes. Participating in groups: Yes. Taking medication as prescribed: Yes. Toleration medication: Yes. Family/Significant other contact made: No, will contact:  Declined consent Patient understands diagnosis: Yes. Discussing patient identified problems/goals with staff: Yes. Medical problems stabilized or resolved: Yes. Denies suicidal/homicidal ideation: Yes. Issues/concerns per patient self-inventory: No. Other: n/a  New problem(s) identified: No, Describe:  None  New Short Term/Long Term Goal(s): medication stabilization, elimination of SI  thoughts, development of comprehensive mental wellness plan.   Patient Goals: I really need to go home   Discharge Plan or Barriers: Patient recently admitted. CSW will continue to follow and assess for appropriate referrals and possible discharge planning.    Reason for Continuation of Hospitalization: Anxiety Depression Medication stabilization Other; describe Mood stabilization, discharge planning  Estimated Length of Stay: 2-3 DAYS  Last 3 [place] Suicide Severity Risk Score: Flowsheet Row [facility] (Current) [facility] Most recent reading at 02/19/2024  2:00 AM ED from 02/18/2024 in [facility] Department at [facility] Most recent reading at 02/18/2024 10:39 AM ED from 08/08/2023 in [facility] Emergency Department at [facility] Most recent reading at 08/09/2023 11:57 AM  C-SSRS RISK CATEGORY Moderate Risk High Risk  High Risk    Last PHQ 2/9 Scores:    11/30/2014    3:15 PM  Depression screen PHQ 2/9  Decreased Interest 1  Down, Depressed, Hopeless 1  PHQ - 2 Score 2  Altered sleeping 3  Tired, decreased energy 3  Change in appetite 0  Feeling bad or failure about yourself  0  Trouble concentrating 1  Moving slowly or fidgety/restless 1  Suicidal thoughts 0   PHQ-9 Score 10     Data saved with a previous flowsheet row definition    Scribe for Treatment Team: Tacha Manni N Tura Roller, LCSW 02/21/2024 4:03 PM

## 2024-02-21 NOTE — Group Note (Signed)
 Recreation Therapy Group Note   Group Topic:Stress Management  Group Date: 02/21/2024 Start Time: 1610 End Time: 1003 Facilitators: Jasline Buskirk-McCall, LRT,CTRS Location: [address] Dayroom   Group Topic: Stress Management   Goal Area(s) Addresses:  Patient will actively participate in stress management techniques presented during session.  Patient will successfully identify benefit of practicing stress management post d/c.   Behavioral Response: Appropriate  Intervention: Relaxation exercise with ambient sound and script   Activity: Guided Imagery. LRT provided education, instruction, and demonstration on practice of visualization via guided imagery. Patient was asked to participate in the technique introduced during session. LRT debriefed including topics of mindfulness, stress management and specific scenarios each patient could use these techniques. Patients were given suggestions of ways to access scripts post d/c and encouraged to explore Youtube and other apps available on smartphones, tablets, and computers.  Education:  Stress Management, Discharge Planning.   Education Outcome: Acknowledges education   Affect/Mood: Appropriate   Participation Level: Engaged   Participation Quality: Independent   Behavior: Appropriate   Speech/Thought Process: Focused   Insight: Good   Judgement: Good   Modes of Intervention: Guided Imagery   Patient Response to Interventions:  Engaged   Education Outcome:  In group clarification offered    Clinical Observations/Individualized Feedback: Patient actively engaged in technique introduced. Patient shared that the session made her feel like she was in [place] having conch fritters. Patient expressed no concerns and demonstrated ability to practice skill independently post d/c.     Plan: Continue to engage patient in RT group sessions 2-3x/week.   Earley Grobe-McCall, LRT,CTRS  02/21/2024 12:39 PM

## 2024-02-21 NOTE — [facility] Group Notes (Signed)
 The focus of this group is to help patients review their daily goal of treatment and discuss progress on daily workbooks. Pt was attentive and appropriate during tonight's wrap up group with aa.

## 2024-02-21 NOTE — Group Note (Signed)
 Occupational Therapy Group Note  Group Topic: Sleep Hygiene  Group Date: 02/21/2024 Start Time: 1430 End Time: 1500 Facilitators: Lynnda Sas, OT   Group Description: Group encouraged increased participation and engagement through topic focused on sleep hygiene. Patients reflected on the quality of sleep they typically receive and identified areas that need improvement. Group was given background information on sleep and sleep hygiene, including common sleep disorders. Group members also received information on how to improve one's sleep and introduced a sleep diary as a tool that can be utilized to track sleep quality over a length of time. Group session ended with patients identifying one or more strategies they could utilize or implement into their sleep routine in order to improve overall sleep quality.        Therapeutic Goal(s):  Identify one or more strategies to improve overall sleep hygiene  Identify one or more areas of sleep that are negatively impacted (sleep too much, too little, etc)     Participation Level: Engaged   Participation Quality: Independent   Behavior: Appropriate   Speech/Thought Process: Relevant   Affect/Mood: Appropriate   Insight: Fair   Judgement: Fair      Modes of Intervention: Education  Patient Response to Interventions:  Attentive   Plan: Continue to engage patient in OT groups 2 - 3x/week.  02/21/2024  Lynnda Sas, OT   Mykel Sponaugle, OT

## 2024-02-21 NOTE — Group Note (Signed)
 Date:  02/21/2024 Time:  10:31 AM  Group Topic/Focus:  Emotional Education:   The focus of this group is to discuss what feelings/emotions are, and how they are experienced. Goals Group:   The focus of this group is to help patients establish daily goals to achieve during treatment and discuss how the patient can incorporate goal setting into their daily lives to aide in recovery. Orientation:   The focus of this group is to educate the patient on the purpose and policies of crisis stabilization and provide a format to answer questions about their admission.  The group details unit policies and expectations of patients while admitted.    Participation Level:  Did Not Attend   Julie Conley 02/21/2024, 10:31 AM

## 2024-02-21 NOTE — Progress Notes (Addendum)
 [facility] MD Progress Note  02/21/2024 3:15 PM Julie Conley  MRN:  [id number]  Reason for Admission: 48 year old Caucasian female. Admitted to the [facility] from the [facility] with complaints of alcohol intoxications and worsening symptoms of depression due to grief. A review of her current lab results has shown her toxicology results to be 247 & UDS was clear of any other substances. Per chart review reports, patient seems to have extensive hx of alcohol use disorder, chronic. She seems to have received treatments at the [facility], [facility] & [facility] health care in the past. Chart review reports also showed patient was receiving treatments on an outpatient basis for bipolar disorder. After medical evaluation, stabilization & clearance, patient was transferred to the [facility] for alcohol detoxification treatment and further psychiatric evaluation/treatments. Patient's current lab results has been reviewed.    Daily Notes: The chart was reviewed today. The patient was discussed at the multidisciplinary team meeting.  During today's assessment, the patient reports seeing pain management and not receiving her regular oxycodone  10 mg 3 times daily.  She says she has an upcoming surgery anticipated.  She expresses desire to rescind the previously signed 72-hour discharge form stating I was lied to.  She states her Flexeril  has not been ordered and that she takes Klonopin  1 mg as needed twice daily at home.  She denies suicidal or homicidal ideation, intent, plan, and psychotic symptoms.  She reports her energy is improved, appetite is fine, concentration adequate.  Denies withdrawal symptoms.  Reports attending group sessions on the unit.  Denies side effects from current psychiatric medications.  Identifies as having bipolar 1, reports recent manic episode, and states current admission was for detox, not depression.  Patient reports she is missing her intensive outpatient, AA meetings,  and court  obligations by being here.  She also reports she has a strong support system at home, and she is ready to be discharged.  The patient appears alert, oriented, cooperative but focused on discharge.  Patient was offered gabapentin  for alcohol cravings and anxiety, patient declines due to prior kidney concerns.  Discussed starting Abilify  5 mg daily for mood stabilization; patient consents.  She remains on the CIWA Ativan  detox and taper protocol. Per nursing report, the patient signed a 72 hour request for discharge on 02/20/24 at 1103.   The case was discussed with the attending psychiatrist.  Plan to start Abilify  5 mg daily for mood stabilization. Resume oxycodone  10-3 25 3  times daily as needed. Restart Klonopin  at 0.5 mg 3 times daily with recommendation to decrease to 2 times daily tomorrow. Nursing communication: Separate administration of oxycodone  and Klonopin  by at least 1 hour  Hold for sedation, hold for O2 sat <92%.  Patient consents to medication adjustments and discharge planning. Expected discharge date tomorrow, June 17, after CIWA Ativan  taper complete.   Principal Problem: Alcohol use disorder, severe, dependence (HCC)  Diagnosis: Principal Problem:   Alcohol use disorder, severe, dependence (HCC) Active Problems:   Severe episode of recurrent major depressive disorder, without psychotic features (HCC)   Bipolar 1 disorder, depressed, severe (HCC)  Total Time spent with patient: 45 minutes  Past Psychiatric History: See H&P.  Past Medical History:  Past Medical History:  Diagnosis Date   Alcohol abuse    Anxiety    Bimalleolar fracture of left ankle 04/2015   Complication of anesthesia    hypotension with breast augmentation   Depression    GERD (gastroesophageal reflux disease)  Headache    once/week (03/20/2016)   Hypothyroidism 2010-2012   stopped RX in 2012 when I didn't meet standards (03/20/2016)   Iron deficiency anemia    Migraines    once/month  (03/20/2016)   Pneumonia several times   Sciatic pain 04/12/2015   left    Past Surgical History:  Procedure Laterality Date   ABDOMINOPLASTY  2015   APPENDECTOMY  ~ 1986   AUGMENTATION MAMMAPLASTY  2015   CESAREAN SECTION  1996; 2000   CYSTOSCOPY WITH HYDRODISTENSION AND BIOPSY  2010   DILATION AND CURETTAGE OF UTERUS  2010   ENDOMETRIAL ABLATION  2010   FRACTURE SURGERY     LAPAROSCOPIC CHOLECYSTECTOMY  2006   LAPAROSCOPIC TUBAL LIGATION  2001   w/banding   LAPAROTOMY  2011   NASAL SEPTOPLASTY W/ TURBINOPLASTY  1991; 2008   ORIF ANKLE FRACTURE Left 04/15/2015   Procedure: OPEN REDUCTION INTERNAL FIXATION (ORIF) LEFT BIMALLEOLAR AND SYNDESMOSIS ANKLE FRACTURE;  Surgeon: Evalene JONETTA Chancy, MD;  Location: [facility];  Service: Orthopedics;  Laterality: Left;   ROUX-EN-Y GASTRIC BYPASS  2009   SHOULDER ARTHROSCOPY     TONSILLECTOMY  ~ 1981   VAGINAL HYSTERECTOMY  2013   complete   Family History:  Family History  Problem Relation Age of Onset   Drug abuse Brother    Alcohol abuse Father    Drug abuse Mother    Family Psychiatric  History: See H&P.  Social History:  Social History   Substance and Sexual Activity  Alcohol Use Yes   Comment: Last 7 days in a row; Heavy use     Social History   Substance and Sexual Activity  Drug Use No    Social History   Socioeconomic History   Marital status: Widowed    Spouse name: Not on file   Number of children: 2   Years of education: 4   Highest education level: Not on file  Occupational History   Occupation: Registered nurse    Comment: License is suspended for DUI until May or June  Tobacco Use   Smoking status: Every Day    Current packs/day: 0.25    Average packs/day: 0.3 packs/day for 0.5 years (0.1 ttl pk-yrs)    Types: Cigarettes   Smokeless tobacco: Never  Vaping Use   Vaping status: Never Used  Substance and Sexual Activity   Alcohol use: Yes    Comment: Last 7 days in a row; Heavy use    Drug use: No   Sexual activity: Not on file  Other Topics Concern   Not on file  Social History Narrative   ** Merged History Encounter **       Social Drivers of Health   Financial Resource Strain: Not on file  Food Insecurity: No Food Insecurity (02/19/2024)   Hunger Vital Sign    Worried About Running Out of Food in the Last Year: Never true    Ran Out of Food in the Last Year: Never true  Transportation Needs: No Transportation Needs (02/19/2024)   PRAPARE - Administrator, Civil Service (Medical): No    Lack of Transportation (Non-Medical): No  Physical Activity: Not on file  Stress: Not on file  Social Connections: Not on file   Additional Social History:   Sleep: Good Estimated Sleeping Duration (Last 24 Hours): 6.75-7.25 hours  Appetite:  Good  Current Medications: Current Facility-Administered Medications  Medication Dose Route Frequency Provider Last Rate Last Admin  acetaminophen  (TYLENOL ) tablet 650 mg  650 mg Oral Q6H PRN Mannie Jerel PARAS, NP   650 mg at 02/19/24 2133   alum & mag hydroxide-simeth (MAALOX/MYLANTA) 200-200-20 MG/5ML suspension 30 mL  30 mL Oral Q4H PRN Mannie Jerel PARAS, NP       ARIPiprazole  (ABILIFY ) tablet 5 mg  5 mg Oral Once Zouev, Dmitri, MD       [START ON 02/22/2024] ARIPiprazole  (ABILIFY ) tablet 5 mg  5 mg Oral Daily Zouev, Dmitri, MD       clonazePAM  (KLONOPIN ) tablet 0.5 mg  0.5 mg Oral TID Zouev, Dmitri, MD   0.5 mg at 02/21/24 1304   haloperidol  (HALDOL ) tablet 5 mg  5 mg Oral TID PRN Mannie Jerel PARAS, NP       And   diphenhydrAMINE  (BENADRYL ) capsule 50 mg  50 mg Oral TID PRN Mannie Jerel PARAS, NP       haloperidol  lactate (HALDOL ) injection 5 mg  5 mg Intramuscular TID PRN Mannie Jerel PARAS, NP       And   diphenhydrAMINE  (BENADRYL ) injection 50 mg  50 mg Intramuscular TID PRN Mannie Jerel PARAS, NP       And   LORazepam  (ATIVAN ) injection 2 mg  2 mg Intramuscular TID PRN Mannie Jerel PARAS, NP       haloperidol  lactate  (HALDOL ) injection 10 mg  10 mg Intramuscular TID PRN Mannie Jerel PARAS, NP       And   diphenhydrAMINE  (BENADRYL ) injection 50 mg  50 mg Intramuscular TID PRN Mannie Jerel PARAS, NP       And   LORazepam  (ATIVAN ) injection 2 mg  2 mg Intramuscular TID PRN Mannie Jerel PARAS, NP       gabapentin  (NEURONTIN ) capsule 200 mg  200 mg Oral TID Zouev, Dmitri, MD       hydrOXYzine  (ATARAX ) tablet 25 mg  25 mg Oral Q6H PRN Collene Gouge I, NP   25 mg at 02/20/24 2112   loperamide  (IMODIUM ) capsule 2-4 mg  2-4 mg Oral PRN Collene Gouge I, NP       LORazepam  (ATIVAN ) tablet 1 mg  1 mg Oral BID Nwoko, Agnes I, NP   1 mg at 02/21/24 9162   Followed by   NOREEN ON 02/22/2024] LORazepam  (ATIVAN ) tablet 1 mg  1 mg Oral Daily Nwoko, Agnes I, NP       magnesium  hydroxide (MILK OF MAGNESIA) suspension 30 mL  30 mL Oral Daily PRN Mannie Jerel PARAS, NP       multivitamin with minerals tablet 1 tablet  1 tablet Oral Daily Collene Gouge I, NP   1 tablet at 02/21/24 9162   nicotine  (NICODERM CQ  - dosed in mg/24 hours) patch 21 mg  21 mg Transdermal Q0600 Mannie Jerel PARAS, NP       oxyCODONE -acetaminophen  (PERCOCET/ROXICET) 5-325 MG per tablet 1 tablet  1 tablet Oral Q8H PRN Blair, Artez Regis H, NP   1 tablet at 02/21/24 1320   QUEtiapine  (SEROQUEL ) tablet 300 mg  300 mg Oral QHS Mannie Jerel PARAS, NP   300 mg at 02/20/24 2112   thiamine  (Vitamin B-1) tablet 100 mg  100 mg Oral Daily Nwoko, Agnes I, NP   100 mg at 02/21/24 9162   traZODone  (DESYREL ) tablet 50 mg  50 mg Oral QHS Mannie Jerel PARAS, NP   50 mg at 02/20/24 2112   venlafaxine  XR (EFFEXOR -XR) 24 hr capsule 225 mg  225 mg Oral Q breakfast Collene Gouge I,  NP   225 mg at 02/21/24 9162   Lab Results:  No results found for this or any previous visit (from the past 48 hours).  Blood Alcohol level:  Lab Results  Component Value Date   ETH 247 (H) 02/18/2024   ETH 329 (HH) 08/08/2023   Metabolic Disorder Labs: Lab Results  Component Value Date   HGBA1C 5.6 02/19/2024    MPG 114.02 02/19/2024   MPG 105 01/20/2016   No results found for: PROLACTIN Lab Results  Component Value Date   CHOL 158 02/19/2024   TRIG 83 02/19/2024   HDL 83 02/19/2024   CHOLHDL 1.9 02/19/2024   VLDL 17 02/19/2024   LDLCALC 58 02/19/2024   Physical Findings: AIMS:  ,  ,  ,  ,  ,  ,   CIWA:  CIWA-Ar Total: 3 COWS:     Musculoskeletal: Strength & Muscle Tone: within normal limits Gait & Station: normal Patient leans: N/A  Psychiatric Specialty Exam:  Presentation  General Appearance:  Casual (Hair colored in red dye.)  Eye Contact: Fair  Speech: Clear and Coherent; Normal Rate  Speech Volume: Normal  Handedness: Right  Mood and Affect  Mood: Anxious; Depressed (rates depression #8, anxiety #9.)  Affect: Non-Congruent  Thought Process  Thought Processes: Coherent; Linear  Descriptions of Associations:Intact  Orientation:Full (Time, Place and Person)  Thought Content:Logical  History of Schizophrenia/Schizoaffective disorder:No data recorded Duration of Psychotic Symptoms:No data recorded Hallucinations:No data recorded  Ideas of Reference:None  Suicidal Thoughts:No data recorded  Homicidal Thoughts:No data recorded  Sensorium  Memory: Immediate Good; Recent Good; Remote Good  Judgment: Poor  Insight: Lacking  Executive Functions  Concentration: Fair  Attention Span: Fair  Recall: Good  Fund of Knowledge: Fair  Language: Good  Psychomotor Activity  Psychomotor Activity: No data recorded  Assets  Assets: Communication Skills; Financial Resources/Insurance; Housing; Social Support  Sleep  Sleep: No data recorded  Physical Exam: Physical Exam Vitals and nursing note reviewed.  HENT:     Head: Normocephalic.     Nose: Nose normal.   Cardiovascular:     Rate and Rhythm: Normal rate.     Pulses: Normal pulses.  Pulmonary:     Effort: Pulmonary effort is normal.  Genitourinary:    Comments:  Deferred  Musculoskeletal:        General: Normal range of motion.     Cervical back: Normal range of motion.   Skin:    General: Skin is dry.   Neurological:     General: No focal deficit present.     Mental Status: She is alert and oriented to person, place, and time.    Review of Systems  Constitutional:  Negative for chills, diaphoresis and fever.  HENT:  Negative for congestion and sore throat.   Respiratory:  Negative for cough, shortness of breath and wheezing.   Cardiovascular:  Negative for chest pain and palpitations.  Gastrointestinal:  Negative for abdominal pain, constipation, diarrhea, heartburn, nausea and vomiting.  Neurological:  Negative for dizziness, tingling, tremors, sensory change, speech change, focal weakness, seizures (Hx alcohol withdrawal seizures.), loss of consciousness, weakness and headaches.  Endo/Heme/Allergies:        Reviewed allergy lists.  Psychiatric/Behavioral:  Positive for depression and substance abuse. Negative for hallucinations, memory loss and suicidal ideas. The patient is nervous/anxious. The patient does not have insomnia.    Blood pressure 106/75, pulse 85, temperature 98.1 F (36.7 C), temperature source Oral, resp. rate 16, height 5' 3 (1.6  m), weight 91.4 kg, SpO2 97%. Body mass index is 35.71 kg/m.  Treatment Plan Summary: Daily contact with patient to assess and evaluate symptoms and progress in treatment and Medication management.  Principal/active diagnoses.  Alcohol use disorder, severe, dependence (HCC)   Other active Problems: Severe episode of recurrent major depressive disorder, without psychotic features (HCC) Bipolar 1 disorder, depressed, severe (HCC)  Plan: The risks/benefits/side-effects/alternatives to the medications in use were discussed in detail with the patient and time was given for patient's questions. The patient consents to medication trial.   -Continue the CIWA detox taper for alcohol withdrawal  management.  -Continue Effexor  XR from 150 mg to 225 mg po daily for depression.  -Continue Seroquel  300 mg po Q hs for mood control.  -Continue Hydroxyzine  25 mg po tid prn for anxiety.  -Continue Nico-derm patch Q 24 hrs for smoking cessation.  - Resume oxycodone  10-3 25 3  times daily as needed, pain - Restart Klonopin  at 0.5 mg 3 times daily, anxiety with recommendation to decrease to 2 times daily tomorrow - Nursing communication: Separate administration of oxycodone  and Klonopin  by at least 1 hour  Hold for sedation, hold for O2 sat <92%      Agitation protocols.  -Continue as recommended (see MAR).   Other PRNS -Continue Tylenol  650 mg every 6 hours PRN for mild pain. -Continue Maalox 30 ml Q 4 hrs PRN for indigestion. -Continue MOM 30 ml po Q 6 hrs for constipation.  -Continue Ibuprofen  800 mg po tid prn for pain.   Safety and Monitoring: Voluntary admission to inpatient psychiatric unit for safety, stabilization and treatment Daily contact with patient to assess and evaluate symptoms and progress in treatment Patient's case to be discussed in multi-disciplinary team meeting Observation Level : q15 minute checks Vital signs: q12 hours Precautions: Safety   Discharge Planning: Social work and case management to assist with discharge planning and identification of hospital follow-up needs prior to discharge Estimated LOS: 5-7 days Discharge Concerns: Need to establish a safety plan; Medication compliance and effectiveness Discharge Goals: Return home with outpatient referrals for mental health follow-up including medication management/psychotherapy  Blair Chiquita Hint, NP, pmhnp, fnp-bc 02/21/2024, 3:15 PM Patient ID: Julie Conley, female   DOB: 1976-04-09, 48 y.o.   MRN: [id number]

## 2024-02-21 NOTE — BHH Group Notes (Signed)
 Spirituality Group   Group Goal: Support / Education around grief and loss    Group Description: Following introductions and group rules, group members engaged in facilitated group dialog and support around topic of loss, with particular support around experiences of loss in their lives. Group members identified types of loss (relationships / self / things) as well as patterns, circumstances, and changes that precipitate loss. Reflection invited on thoughts / feelings around loss, normalized grief responses, and recognized variety in grief experience. Group noted Worden's four tasks of grief in discussion. Group drew on Adlerian / Rogerian, narrative, MI, with Yalom's group therapy as a primary framework.   Observations: Julie Conley was present for last third of group. She actively engaged the group discussion.  [name] L. Minetta Aly, M.Div [phone]

## 2024-02-22 DIAGNOSIS — F1020 Alcohol dependence, uncomplicated: Secondary | ICD-10-CM | POA: Diagnosis not present

## 2024-02-22 MED ORDER — VITAMIN B-1 100 MG PO TABS: 100.0000 mg | ORAL_TABLET | Freq: Every day | ORAL | Status: AC

## 2024-02-22 MED ORDER — NICOTINE 21 MG/24HR TD PT24: 21.0000 mg | MEDICATED_PATCH | Freq: Every day | TRANSDERMAL | Status: AC

## 2024-02-22 MED ORDER — ARIPIPRAZOLE 5 MG PO TABS: 5.0000 mg | ORAL_TABLET | Freq: Every day | ORAL | 0 refills | Status: AC

## 2024-02-22 MED ORDER — VENLAFAXINE HCL ER 75 MG PO CP24: 225.0000 mg | ORAL_CAPSULE | Freq: Every day | ORAL | 0 refills | Status: AC

## 2024-02-22 NOTE — Progress Notes (Signed)
 [facility] Adult Case Management Discharge Plan :  Will you be returning to the same living situation after discharge:  Yes,  home w/ family At discharge, do you have transportation home?: Yes,  CSW to arrange via [employer] Do you have the ability to pay for your medications: Yes,  active insurance  Release of information consent forms completed and in the chart;  Patient's signature needed at discharge.  Patient to Follow up at:  Follow-up Information     [facility] Go on 02/29/2024.   Why: You have a hospital follow up appointment on 02/29/24 at 1:00 pm, for intensive therapy and medication management services.  The appointment will be held in person. Contact information: [address] [place] [address] [phone]         [facility]. Go on 03/08/2024.   Why: You have an appointment on 03/08/24 at 2:00 pm, in person. Contact information: [address] [place], [place] [address] Phone: [phone]                Next level of care provider has access to [facility] Link:no  Safety Planning and Suicide Prevention discussed: Yes,  completed w/ patient  Has patient been referred to the Quitline?: Patient refused referral for treatment  Patient has been referred for addiction treatment: Yes, the patient will follow up with an outpatient provider for substance use disorder. Therapist: appointment made with [facility] for continued therapy and SA-IOP.  Bertha Lokken N Tashonda Pinkus, LCSW 02/22/2024, 10:12 AM

## 2024-02-22 NOTE — Progress Notes (Signed)
 D: Pt A & O X 3. Denies SI, HI, AVH and pain at this time. D/C home as ordered. Picked up in front of facility by her friend.  A: D/C instructions reviewed with pt including prescriptions and follow up appointments; compliance encouraged. All belongings from assigned locker returned to pt at time of departure. Scheduled  medications given with verbal education and effects monitored. Safety checks maintained without incident till time of d/c.  R: Pt receptive to care. Compliant with medications when offered. Denies adverse drug reactions when assessed. Verbalized understanding related to d/c instructions. Signed belonging sheet in agreement with items received from locker. Ambulatory with a steady gait. Appears to be in no physical distress at time of departure.

## 2024-02-22 NOTE — [facility] Suicide Risk Assessment (Signed)
 Suicide Risk Assessment  Discharge Assessment    [facility] Discharge Suicide Risk Assessment   Principal Problem: Alcohol use disorder, severe, dependence (HCC) Discharge Diagnoses: Principal Problem:   Alcohol use disorder, severe, dependence (HCC) Active Problems:   Severe episode of recurrent major depressive disorder, without psychotic features (HCC)   Bipolar 1 disorder, depressed, severe (HCC)   Total Time spent with patient: 30 minutes  Musculoskeletal: Strength & Muscle Tone: within normal limits Gait & Station: normal Patient leans: N/A  Psychiatric Specialty Exam  Presentation  General Appearance:  Casual  Eye Contact: Good  Speech: Clear and Coherent; Normal Rate  Speech Volume: Normal  Handedness: Right   Mood and Affect  Mood: Euthymic  Duration of Depression Symptoms: No data recorded Affect: Appropriate; Full Range   Thought Process  Thought Processes: Coherent; Linear  Descriptions of Associations:Intact  Orientation:Full (Time, Place and Person)  Thought Content:Logical  History of Schizophrenia/Schizoaffective disorder:No data recorded Duration of Psychotic Symptoms:No data recorded Hallucinations:No data recorded Ideas of Reference:None  Suicidal Thoughts:Suicidal Thoughts: No  Homicidal Thoughts:Homicidal Thoughts: No   Sensorium  Memory: Immediate Good; Recent Good; Remote Good  Judgment: Good  Insight: Good   Executive Functions  Concentration: Good  Attention Span: Good  Recall: Good  Fund of Knowledge: Good  Language: Good   Psychomotor Activity  Psychomotor Activity:Psychomotor Activity: Normal   Assets  Assets: Communication Skills; Desire for Improvement; Housing; Resilience; Social Support   Sleep  Sleep:Sleep: Good  Estimated Sleeping Duration (Last 24 Hours): 7.25-7.50 hours  Physical Exam: Physical Exam ROS Blood pressure 111/82, pulse 85, temperature 98 F (36.7 C),  temperature source Oral, resp. rate 16, height 5' 3 (1.6 m), weight 91.4 kg, SpO2 99%. Body mass index is 35.71 kg/m.  Mental Status Per Nursing Assessment::   On Admission:  Self-harm thoughts  Demographic Factors:  Low socioeconomic status  Loss Factors: Financial problems/change in socioeconomic status  Historical Factors: Prior suicide attempts  Risk Reduction Factors:   Responsible for children under 31 years of age, Sense of responsibility to family, Religious beliefs about death, Positive social support, Positive therapeutic relationship, and Positive coping skills or problem solving skills  Continued Clinical Symptoms:  More than one psychiatric diagnosis Previous Psychiatric Diagnoses and Treatments  Cognitive Features That Contribute To Risk:  None    Suicide Risk:  Mild:  No current suicidal thoughts. There are no identifiable plans, no associated intent, mild dysphoria and related symptoms, good self-control (both objective and subjective assessment), few other risk factors, and identifiable protective factors, including available and accessible social support.   Follow-up Information     [facility] Go on 02/29/2024.   Why: You have a hospital follow up appointment on 02/29/24 at 1:00 pm, for intensive therapy and medication management services.  The appointment will be held in person. Contact information: [address] [place] [address] [phone]         [facility] [place]. Go on 03/08/2024.   Why: You have an appointment on 03/08/24 at 2:00 pm, in person. Contact information: [address] [place], [place] [address] Phone: [phone]                Plan Of Care/Follow-up recommendations:  See Discharge Summary   Lennard Quirk, NP 02/22/2024, 10:27 AM

## 2024-02-22 NOTE — Discharge Summary (Addendum)
 Physician Discharge Summary Note  Patient:  Julie Conley is an 48 y.o., female MRN:  [id number] DOB:  Nov 03, 1975 Patient phone:  [phone] (home)  Patient address:   [address] [place] [address],  Total Time spent with patient: 30 minutes  Date of Admission:  02/18/2024 Date of Discharge: 02/23/24   Reason for Admission:  48 year old Caucasian female. Admitted to the [facility] from the [facility] with complaints of alcohol intoxications and worsening symptoms of depression due to grief. A review of her current lab results has shown her toxicology results to be 247 & UDS was clear of any other substances. Per chart review reports, patient seems to have extensive hx of alcohol use disorder, chronic. She seems to have received treatments at the [facility], [facility] & [facility] health care in the past. Chart review reports also showed patient was receiving treatments on an outpatient basis for bipolar disorder. After medical evaluation, stabilization & clearance, patient was transferred to the [facility] for alcohol detoxification treatment and further psychiatric evaluation/treatments.   Principal Problem: Alcohol use disorder, severe, dependence (HCC) Discharge Diagnoses: Principal Problem:   Alcohol use disorder, severe, dependence (HCC) Active Problems:   Severe episode of recurrent major depressive disorder, without psychotic features (HCC)   Bipolar 1 disorder, depressed, severe (HCC)   Past Psychiatric History: See H&P   Past Medical History:  Past Medical History:  Diagnosis Date   Alcohol abuse    Anxiety    Bimalleolar fracture of left ankle 04/2015   Complication of anesthesia    hypotension with breast augmentation   Depression    GERD (gastroesophageal reflux disease)    Headache    once/week (03/20/2016)   Hypothyroidism 2010-2012   stopped RX in 2012 when I didn't meet standards (03/20/2016)   Iron deficiency anemia    Migraines     once/month (03/20/2016)   Pneumonia several times   Sciatic pain 04/12/2015   left    Past Surgical History:  Procedure Laterality Date   ABDOMINOPLASTY  2015   APPENDECTOMY  ~ 1986   AUGMENTATION MAMMAPLASTY  2015   CESAREAN SECTION  1996; 2000   CYSTOSCOPY WITH HYDRODISTENSION AND BIOPSY  2010   DILATION AND CURETTAGE OF UTERUS  2010   ENDOMETRIAL ABLATION  2010   FRACTURE SURGERY     LAPAROSCOPIC CHOLECYSTECTOMY  2006   LAPAROSCOPIC TUBAL LIGATION  2001   w/banding   LAPAROTOMY  2011   NASAL SEPTOPLASTY W/ TURBINOPLASTY  1991; 2008   ORIF ANKLE FRACTURE Left 04/15/2015   Procedure: OPEN REDUCTION INTERNAL FIXATION (ORIF) LEFT BIMALLEOLAR AND SYNDESMOSIS ANKLE FRACTURE;  Surgeon: Saundra Curl, MD;  Location: [facility];  Service: Orthopedics;  Laterality: Left;   ROUX-EN-Y GASTRIC BYPASS  2009   SHOULDER ARTHROSCOPY     TONSILLECTOMY  ~ 1981   VAGINAL HYSTERECTOMY  2013   complete   Family History:  Family History  Problem Relation Age of Onset   Drug abuse Brother    Alcohol abuse Father    Drug abuse Mother    Family Psychiatric  History: See H&P  Social History:  Social History   Substance and Sexual Activity  Alcohol Use Yes   Comment: Last 7 days in a row; Heavy use     Social History   Substance and Sexual Activity  Drug Use No    Social History   Socioeconomic History   Marital status: Widowed    Spouse  name: Not on file   Number of children: 2   Years of education: 4   Highest education level: Not on file  Occupational History   Occupation: Registered nurse    Comment: License is suspended for DUI until May or June  Tobacco Use   Smoking status: Every Day    Current packs/day: 0.25    Average packs/day: 0.3 packs/day for 0.5 years (0.1 ttl pk-yrs)    Types: Cigarettes   Smokeless tobacco: Never  Vaping Use   Vaping status: Never Used  Substance and Sexual Activity   Alcohol use: Yes    Comment: Last 7 days in a row;  Heavy use   Drug use: No   Sexual activity: Not on file  Other Topics Concern   Not on file  Social History Narrative   ** Merged History Encounter **       Social Drivers of Health   Financial Resource Strain: Not on file  Food Insecurity: No Food Insecurity (02/19/2024)   Hunger Vital Sign    Worried About Running Out of Food in the Last Year: Never true    Ran Out of Food in the Last Year: Never true  Transportation Needs: No Transportation Needs (02/19/2024)   PRAPARE - Administrator, Civil Service (Medical): No    Lack of Transportation (Non-Medical): No  Physical Activity: Not on file  Stress: Not on file  Social Connections: Not on file    Hospital Course:  During the patient's hospitalization, patient had extensive initial psychiatric evaluation, and follow-up psychiatric evaluations every day.  Psychiatric diagnoses provided upon initial assessment:  Principal Problem:   Alcohol use disorder, severe, dependence (HCC) Active Problems:   Severe episode of recurrent major depressive disorder, without psychotic features (HCC)   Bipolar 1 disorder, depressed, severe (HCC)  Patient's psychiatric medications were adjusted on admission: Effexor  XR 150 mg daily was continued for depression.  Seroquel 300 mg nightly was continued for mood stabilization.  During the hospitalization, other adjustments were made to the patient's psychiatric medication regimen:  Effexor  XR was optimized to 225 mg daily, and her home medication clonazepam  was restarted at 0.5 mg 3 times daily.  Patient's care was discussed during the interdisciplinary team meeting every day during the hospitalization.  The patient denies having side effects to prescribed psychiatric medication.  Gradually, patient started adjusting to milieu. The patient was evaluated each day by a clinical provider to ascertain response to treatment. Improvement was noted by the patient's report of decreasing symptoms,  improved sleep and appetite, affect, medication tolerance, behavior, and participation in unit programming.  Patient was asked each day to complete a self inventory noting mood, mental status, pain, new symptoms, anxiety and concerns.    Symptoms were reported as significantly decreased or resolved completely by discharge.   On day of discharge, the patient reports that their mood is stable. The patient denied having suicidal thoughts for more than 48 hours prior to discharge.  Patient denies having homicidal thoughts.  Patient denies having auditory hallucinations.  Patient denies any visual hallucinations or other symptoms of psychosis. The patient was motivated to continue taking medication with a goal of continued improvement in mental health.   The patient reports their target psychiatric symptoms of anxiety, depression, mood instability, and insomnia responded well to the psychiatric medications, and the patient reports overall benefit other psychiatric hospitalization. Supportive psychotherapy was provided to the patient. The patient also participated in regular group therapy while hospitalized. Coping  skills, problem solving as well as relaxation therapies were also part of the unit programming.  Labs were reviewed with the patient, and abnormal results were discussed with the patient.  The patient is able to verbalize their individual safety plan to this provider.  # It is recommended to the patient to continue psychiatric medications as prescribed, after discharge from the hospital.    # It is recommended to the patient to follow up with your outpatient psychiatric provider and PCP.  # It was discussed with the patient, the impact of alcohol, drugs, tobacco have been there overall psychiatric and medical wellbeing, and total abstinence from substance use was recommended the patient.ed.  # Prescriptions provided or sent directly to preferred pharmacy at discharge. Patient agreeable to  plan. Given opportunity to ask questions. Appears to feel comfortable with discharge.    # In the event of worsening symptoms, the patient is instructed to call the crisis hotline, 911 and or go to the nearest ED for appropriate evaluation and treatment of symptoms. To follow-up with primary care provider for other medical issues, concerns and or health care needs  # Patient was discharged home with a plan to follow up as noted below.   Physical Findings: AIMS:  , ,  0,  ,  ,  ,   CIWA:  CIWA-Ar Total: 2 COWS:   0  Musculoskeletal: Strength & Muscle Tone: within normal limits Gait & Station: normal Patient leans: N/A   Psychiatric Specialty Exam:  Presentation  General Appearance:  Casual  Eye Contact: Good  Speech: Clear and Coherent; Normal Rate  Speech Volume: Normal  Handedness: Right   Mood and Affect  Mood: Euthymic  Affect: Appropriate; Full Range   Thought Process  Thought Processes: Coherent; Linear  Descriptions of Associations:Intact  Orientation:Full (Time, Place and Person)  Thought Content:Logical  History of Schizophrenia/Schizoaffective disorder:No data recorded Duration of Psychotic Symptoms:No data recorded Hallucinations:No data recorded Ideas of Reference:None  Suicidal Thoughts:Suicidal Thoughts: No  Homicidal Thoughts:Homicidal Thoughts: No   Sensorium  Memory: Immediate Good; Recent Good; Remote Good  Judgment: Good  Insight: Good   Executive Functions  Concentration: Good  Attention Span: Good  Recall: Good  Fund of Knowledge: Good  Language: Good   Psychomotor Activity  Psychomotor Activity: Psychomotor Activity: Normal   Assets  Assets: Communication Skills; Desire for Improvement; Housing; Resilience; Social Support   Sleep  Sleep: Sleep: Good  Estimated Sleeping Duration (Last 24 Hours): 0.00 hours   Physical Exam: Physical Exam Vitals and nursing note reviewed.   Constitutional:      General: She is not in acute distress.    Appearance: She is obese.  HENT:     Nose: Nose normal.   Cardiovascular:     Rate and Rhythm: Normal rate.     Pulses: Normal pulses.  Pulmonary:     Effort: No respiratory distress.   Musculoskeletal:        General: Normal range of motion.     Cervical back: Normal range of motion.   Skin:    General: Skin is dry.   Neurological:     General: No focal deficit present.     Mental Status: She is alert and oriented to person, place, and time. Mental status is at baseline.   Psychiatric:        Mood and Affect: Mood normal.        Behavior: Behavior normal.        Thought Content: Thought content normal.  Judgment: Judgment normal.    Review of Systems  Constitutional: Negative.   Respiratory: Negative.    Cardiovascular: Negative.   Gastrointestinal: Negative.   Skin: Negative.   Neurological: Negative.   Psychiatric/Behavioral:  Positive for depression (Stable for outpatient management) and substance abuse. Negative for hallucinations, memory loss and suicidal ideas. The patient is nervous/anxious (Stable for outpatient management). The patient does not have insomnia.    Blood pressure 111/82, pulse 85, temperature 98 F (36.7 C), temperature source Oral, resp. rate 16, height 5' 3 (1.6 m), weight 91.4 kg, SpO2 99%. Body mass index is 35.71 kg/m.   Social History   Tobacco Use  Smoking Status Every Day   Current packs/day: 0.25   Average packs/day: 0.3 packs/day for 0.5 years (0.1 ttl pk-yrs)   Types: Cigarettes  Smokeless Tobacco Never   Tobacco Cessation:  A prescription for an FDA-approved tobacco cessation medication provided at discharge   Blood Alcohol level:  Lab Results  Component Value Date   ETH 247 (H) 02/18/2024   ETH 329 (HH) 08/08/2023    Metabolic Disorder Labs:  Lab Results  Component Value Date   HGBA1C 5.6 02/19/2024   MPG 114.02 02/19/2024   MPG 105  01/20/2016   No results found for: PROLACTIN Lab Results  Component Value Date   CHOL 158 02/19/2024   TRIG 83 02/19/2024   HDL 83 02/19/2024   CHOLHDL 1.9 02/19/2024   VLDL 17 02/19/2024   LDLCALC 58 02/19/2024    See Psychiatric Specialty Exam and Suicide Risk Assessment completed by Attending Physician prior to discharge.  Discharge destination:  Home  Is patient on multiple antipsychotic therapies at discharge:  No   Has Patient had three or more failed trials of antipsychotic monotherapy by history:  No  Recommended Plan for Multiple Antipsychotic Therapies: NA  Discharge Instructions     Activity as tolerated - No restrictions   Complete by: As directed    Diet - low sodium heart healthy   Complete by: As directed       Allergies as of 02/22/2024       Reactions   Adhesive [tape] Other (See Comments)   TEARS SKIN   Naproxen  Hives   Ambien [zolpidem Tartrate] Other (See Comments)   Delirium, disorientation   Ciprofloxacin Itching   Latex Other (See Comments)   Pulls skin off'    Zofran  [ondansetron ]    doesn't work   Sulfa Antibiotics Itching, Rash        Medication List     STOP taking these medications    amphetamine-dextroamphetamine 20 MG tablet Commonly known as: ADDERALL   cyclobenzaprine  10 MG tablet Commonly known as: FLEXERIL    ibuprofen  800 MG tablet Commonly known as: ADVIL    promethazine  25 MG tablet Commonly known as: PHENERGAN    QUEtiapine 300 MG tablet Commonly known as: SEROQUEL   traZODone  50 MG tablet Commonly known as: DESYREL        TAKE these medications      Indication  ARIPiprazole 5 MG tablet Commonly known as: ABILIFY Take 1 tablet (5 mg total) by mouth daily.  Indication: Mood Stabilization   clonazePAM  1 MG tablet Commonly known as: KLONOPIN  Take 1 mg by mouth 2 (two) times daily.  Indication: Feeling Anxious   Mounjaro 7.5 MG/0.5ML Pen Generic drug: tirzepatide Inject 7.5 mg into the skin  once a week. On Sunday  Indication: Type 2 Diabetes   multivitamin with minerals Tabs tablet Take 1 tablet by mouth daily. What  changed: when to take this  Indication: Nutritional Support   nicotine  21 mg/24hr patch Commonly known as: NICODERM CQ  - dosed in mg/24 hours Place 1 patch (21 mg total) onto the skin daily at 6 (six) AM.  Indication: Nicotine  Addiction   oxyCODONE -acetaminophen  10-325 MG tablet Commonly known as: PERCOCET Take 1 tablet by mouth 3 (three) times daily as needed for pain.  Indication: Pain   thiamine  100 MG tablet Commonly known as: Vitamin B-1 Take 1 tablet (100 mg total) by mouth daily.  Indication: Deficiency of Vitamin B1   venlafaxine  XR 75 MG 24 hr capsule Commonly known as: EFFEXOR -XR Take 3 capsules (225 mg total) by mouth daily with breakfast. What changed:  medication strength how much to take when to take this  Indication: Major Depressive Disorder        Follow-up Information     [facility] Go on 02/29/2024.   Why: You have a hospital follow up appointment on 02/29/24 at 1:00 pm, for intensive therapy and medication management services.  The appointment will be held in person. Contact information: [address] [place] [address] [phone]         [facility] [place] Go on 03/08/2024.   Why: You have an appointment on 03/08/24 at 2:00 pm, in person. Contact information: [address] [place], [place] [address] Phone: [phone]                Follow-up recommendations:   Activity: as tolerated  Diet: heart healthy  Other: -Follow-up with your outpatient psychiatric provider -instructions on appointment date, time, and address (location) are provided to you in discharge paperwork.  -Take your psychiatric medications as prescribed at discharge - instructions are provided to you in the discharge paperwork  -Follow-up with outpatient primary care doctor and other specialists  -for management of preventative medicine and chronic medical disease  -Testing: Follow-up with outpatient provider for abnormal lab results:   -If you are prescribed an atypical antipsychotic medication, we recommend that your outpatient psychiatrist follow routine screening for side effects within 3 months of discharge, including monitoring: AIMS scale, height, weight, blood pressure, fasting lipid panel, HbA1c, and fasting blood sugar.   -Recommend total abstinence from alcohol, tobacco, and other illicit drug use at discharge.   -If your psychiatric symptoms recur, worsen, or if you have side effects to your psychiatric medications, call your outpatient psychiatric provider, 911, [phone] or go to the nearest emergency department.  -If suicidal thoughts occur, immediately call your outpatient psychiatric provider, 911, [phone] or go to the nearest emergency department.   Signed: Lennard Quirk, NP 02/23/2024, 11:36 AM

## 2024-02-22 NOTE — [facility] Suicide Risk Assessment (Signed)
 [facility] INPATIENT:  Patient Suicide Prevention Education  Suicide Prevention Education:  Education Completed;   Suicide Prevention Education was reviewed thoroughly with patient, including risk factors, warning signs, and what to do. Mobile Crisis services were described and that telephone number pointed out, with encouragement to patient to put this number in personal cell phone. Brochure was provided to patient to share with natural supports. Patient acknowledged the ways in which they are at risk, and how working through each of their issues can gradually start to reduce their risk factors. Patient was encouraged to think of the information in the context of people in their own lives. Patient denied having access to firearms Patient verbalized understanding of information provided. Patient endorsed a desire to live.    The suicide prevention education provided includes the following: Suicide risk factors Suicide prevention and interventions National Suicide Hotline telephone number [facility] assessment telephone number [facility] Emergency Assistance 911 [facility] and/or Residential Mobile Crisis Unit telephone number    Diamond Formica, [place], [place] 02/22/24 10:21 AM

## 2024-02-22 NOTE — Group Note (Signed)
 Recreation Therapy Group Note   Group Topic:Animal Assisted Therapy   Group Date: 02/22/2024 Start Time: 7829 End Time: 1035 Facilitators: Manraj Yeo-McCall, LRT,CTRS Location: [address] Dayroom   Animal-Assisted Activity (AAA) Program Checklist/Progress Notes Patient Eligibility Criteria Checklist & Daily Group note for Rec Tx Intervention  AAA/T Program Assumption of Risk Form signed by Patient/ or Parent Legal Guardian Yes  Patient is free of allergies or severe asthma Yes  Patient reports no fear of animals Yes  Patient reports no history of cruelty to animals Yes  Patient understands his/her participation is voluntary Yes  Patient washes hands before animal contact Yes  Patient washes hands after animal contact Yes  Behavioral Response: Minimal   Education: Hand Washing, Appropriate Animal Interaction   Education Outcome: Acknowledges education.    Affect/Mood: Flat   Participation Level: Minimal   Participation Quality: Independent   Behavior: Appropriate   Speech/Thought Process: Relevant   Insight: Moderate   Judgement: Moderate   Modes of Intervention: Teaching laboratory technician   Patient Response to Interventions:  Attentive   Education Outcome:  In group clarification offered    Clinical Observations/Individualized Feedback: Pt came in for the last few minutes of group. Pt had some interaction with therapy dog team.    Plan: Continue to engage patient in RT group sessions 2-3x/week.   Julie Conley, LRT,CTRS  02/22/2024 12:51 PM

## 2024-02-22 NOTE — Group Note (Signed)
 LCSW Group Therapy Note   Group Date: 02/22/2024 Start Time: 1100 End Time: 1200   Participation:  did not attend  Type of Therapy:  Group Therapy  Topic:  Understanding Your Path to Change  Objective:  The goal is to help individuals understand the stages of change, identify where they currently are in the process, and provide actionable next steps to continue moving forward in their journey of change.  Goals: Learn about the six stages of change:  Precontemplation, Contemplation, Preparation, Action, Maintenance, and Relapse Reflect on Current Change Efforts:  Recognize which stage participants are in regarding a personal change. Plan Next Steps for Moving Forward:  Create an action plan based on their current stage of change.  Class Summary:  In this session, we explored the Stages of Change as a framework to understand the process of change.  We discussed how each stage helps individuals recognize where they are in their personal journey and used the Stages of Change Worksheet for self-reflection. Participants answered questions to better understand their current stage, challenges, and progress. We also emphasized the importance of moving forward, even if setbacks (Relapse) occur, and created actionable steps to help participants continue progressing. By the end of the session, participants gained a clearer understanding of their path to change and left with a clear plan for next steps.  Therapeutic Modalities:  Elements of CBT (cognitive restructuring, problem solving)  Element of DBT (mindfulness, distress tolerance)   Green Quincy O Abhiram Criado, LCSWA 02/22/2024  12:16 PM

## 2024-02-22 NOTE — Plan of Care (Signed)
  Problem: Education: Goal: Ability to state activities that reduce stress will improve Outcome: Progressing   Problem: Coping: Goal: Ability to identify and develop effective coping behavior will improve Outcome: Progressing   Problem: Self-Concept: Goal: Ability to identify factors that promote anxiety will improve Outcome: Progressing   Problem: Education: Goal: Utilization of techniques to improve thought processes will improve Outcome: Progressing   Problem: Activity: Goal: Interest or engagement in leisure activities will improve Outcome: Progressing   Problem: Safety: Goal: Ability to disclose and discuss suicidal ideas will improve Outcome: Adequate for Discharge

## 2024-02-22 NOTE — Group Note (Signed)
 Date:  02/22/2024 Time:  10:57 AM  Group Topic/Focus:  Coping With Mental Health Crisis:   The purpose of this group is to help patients identify strategies for coping with mental health crisis.  Group discusses possible causes of crisis and ways to manage them effectively. Goals Group:   The focus of this group is to help patients establish daily goals to achieve during treatment and discuss how the patient can incorporate goal setting into their daily lives to aide in recovery. Orientation:   The focus of this group is to educate the patient on the purpose and policies of crisis stabilization and provide a format to answer questions about their admission.  The group details unit policies and expectations of patients while admitted.    Participation Level:  Did Not Attend   Almarie Arias 02/22/2024, 10:57 AM

## 2024-02-22 NOTE — Transportation (Signed)
 02/22/2024  Julie Conley DOB: December 27, 1975 MRN: [id number]   RIDER WAIVER AND RELEASE OF LIABILITY  For the purposes of helping with transportation needs, [facility] partners with outside transportation providers (taxi companies, [place], Catering [employer].) to give [facility] patients or other approved people the choice of on-demand rides Public librarian) to our buildings for non-emergency visits.  By using [employer], I, the person signing this document, on behalf of myself and/or any legal minors (in my care using the [employer]), agree:  Science writer given to me are supplied by independent, outside transportation providers who do not work for, or have any affiliation with, [employer]. [place] is not a transportation company. [facility] has no control over the quality or safety of the rides I get using [employer]. [facility] has no control over whether any outside ride will happen on time or not. [facility] gives no guarantee on the reliability, quality, safety, or availability on any rides, or that no mistakes will happen. I know and accept that traveling by vehicle (car, truck, [employer], [place] [employer], bus, taxi, etc.) has risks of serious injuries such as disability, being paralyzed, and death. I know and agree the risk of using [employer] is mine alone, and not [employer]. [employer] are provided as is and as are available. The transportation providers are in charge for all inspections and care of the vehicles used to provide these rides. I agree not to take legal action against [facility], its agents, employees, officers, directors, representatives, insurers, attorneys, assigns, successors, subsidiaries, and affiliates at any time for any reasons related directly or indirectly to using [employer]. I also agree not to take legal action against [facility] or its affiliates for any injury, death, or damage to property caused by or related to  using [employer]. I have read this Waiver and Release of Liability, and I understand the terms used in it and their legal meaning. This Waiver is freely and voluntarily given with the understanding that my right (or any legal minors) to legal action against [facility] relating to [employer] is knowingly given up to use these services.   I attest that I read the Ride Waiver and Release of Liability to Julie Conley, gave Ms. Reinhardt the opportunity to ask questions and answered the questions asked (if any). I affirm that Julie Conley then provided consent for assistance with transportation.
